# Patient Record
Sex: Male | Born: 1965
Health system: Southern US, Community
[De-identification: ages and names within clinical notes are randomized; demographics above are authoritative.]

## PROBLEM LIST (undated history)

## (undated) DIAGNOSIS — G4733 Obstructive sleep apnea (adult) (pediatric): Secondary | ICD-10-CM

## (undated) DIAGNOSIS — I4891 Unspecified atrial fibrillation: Secondary | ICD-10-CM

## (undated) DIAGNOSIS — E119 Type 2 diabetes mellitus without complications: Secondary | ICD-10-CM

## (undated) DIAGNOSIS — K219 Gastro-esophageal reflux disease without esophagitis: Secondary | ICD-10-CM

## (undated) DIAGNOSIS — E785 Hyperlipidemia, unspecified: Secondary | ICD-10-CM

## (undated) DIAGNOSIS — R945 Abnormal results of liver function studies: Secondary | ICD-10-CM

## (undated) DIAGNOSIS — R7302 Impaired glucose tolerance (oral): Secondary | ICD-10-CM

## (undated) DIAGNOSIS — M1990 Unspecified osteoarthritis, unspecified site: Secondary | ICD-10-CM

## (undated) DIAGNOSIS — I10 Essential (primary) hypertension: Secondary | ICD-10-CM

## (undated) DIAGNOSIS — K589 Irritable bowel syndrome without diarrhea: Secondary | ICD-10-CM

## (undated) DIAGNOSIS — M109 Gout, unspecified: Secondary | ICD-10-CM

## (undated) DIAGNOSIS — F419 Anxiety disorder, unspecified: Secondary | ICD-10-CM

## (undated) HISTORY — DX: Obstructive sleep apnea (adult) (pediatric): G47.33

## (undated) HISTORY — DX: Unspecified osteoarthritis, unspecified site: M19.90

## (undated) HISTORY — DX: Unspecified atrial fibrillation: I48.91

## (undated) HISTORY — DX: Hyperlipidemia, unspecified: E78.5

## (undated) HISTORY — DX: Gastro-esophageal reflux disease without esophagitis: K21.9

## (undated) HISTORY — DX: Type 2 diabetes mellitus without complications: E11.9

## (undated) HISTORY — PX: NO PAST SURGERIES: SHX2092

## (undated) HISTORY — DX: Irritable bowel syndrome, unspecified: K58.9

## (undated) HISTORY — DX: Impaired glucose tolerance (oral): R73.02

## (undated) HISTORY — DX: Abnormal results of liver function studies: R94.5

## (undated) HISTORY — DX: Essential (primary) hypertension: I10

## (undated) HISTORY — DX: Gout, unspecified: M10.9

## (undated) HISTORY — DX: Anxiety disorder, unspecified: F41.9

---

## 1998-07-29 DIAGNOSIS — R7989 Other specified abnormal findings of blood chemistry: Secondary | ICD-10-CM

## 1998-07-29 HISTORY — DX: Other specified abnormal findings of blood chemistry: R79.89

## 1999-08-23 ENCOUNTER — Emergency Department (HOSPITAL_COMMUNITY): Admission: EM | Admit: 1999-08-23 | Discharge: 1999-08-23 | Payer: Self-pay | Admitting: Emergency Medicine

## 1999-08-23 ENCOUNTER — Encounter: Payer: Self-pay | Admitting: Emergency Medicine

## 2002-06-21 ENCOUNTER — Encounter (INDEPENDENT_AMBULATORY_CARE_PROVIDER_SITE_OTHER): Payer: Self-pay | Admitting: Specialist

## 2002-06-21 ENCOUNTER — Ambulatory Visit (HOSPITAL_BASED_OUTPATIENT_CLINIC_OR_DEPARTMENT_OTHER): Admission: RE | Admit: 2002-06-21 | Discharge: 2002-06-21 | Payer: Self-pay | Admitting: Plastic Surgery

## 2004-06-07 ENCOUNTER — Ambulatory Visit: Payer: Self-pay | Admitting: Pulmonary Disease

## 2004-06-07 ENCOUNTER — Ambulatory Visit: Payer: Self-pay | Admitting: Internal Medicine

## 2004-06-19 ENCOUNTER — Ambulatory Visit (HOSPITAL_BASED_OUTPATIENT_CLINIC_OR_DEPARTMENT_OTHER): Admission: RE | Admit: 2004-06-19 | Discharge: 2004-06-19 | Payer: Self-pay | Admitting: Pulmonary Disease

## 2004-06-19 ENCOUNTER — Ambulatory Visit: Payer: Self-pay | Admitting: Pulmonary Disease

## 2004-07-09 ENCOUNTER — Ambulatory Visit: Payer: Self-pay | Admitting: Internal Medicine

## 2004-08-06 ENCOUNTER — Ambulatory Visit: Payer: Self-pay | Admitting: Internal Medicine

## 2004-08-07 ENCOUNTER — Ambulatory Visit: Payer: Self-pay | Admitting: Pulmonary Disease

## 2004-09-04 ENCOUNTER — Ambulatory Visit: Payer: Self-pay | Admitting: Pulmonary Disease

## 2004-09-17 ENCOUNTER — Ambulatory Visit: Payer: Self-pay | Admitting: Internal Medicine

## 2005-01-17 ENCOUNTER — Ambulatory Visit: Payer: Self-pay | Admitting: Internal Medicine

## 2005-07-01 ENCOUNTER — Ambulatory Visit: Payer: Self-pay | Admitting: Internal Medicine

## 2006-12-30 ENCOUNTER — Ambulatory Visit: Payer: Self-pay | Admitting: Internal Medicine

## 2007-01-29 ENCOUNTER — Emergency Department (HOSPITAL_COMMUNITY): Admission: EM | Admit: 2007-01-29 | Discharge: 2007-01-29 | Payer: Self-pay | Admitting: Emergency Medicine

## 2007-02-17 DIAGNOSIS — K219 Gastro-esophageal reflux disease without esophagitis: Secondary | ICD-10-CM | POA: Insufficient documentation

## 2007-02-17 DIAGNOSIS — E785 Hyperlipidemia, unspecified: Secondary | ICD-10-CM | POA: Insufficient documentation

## 2007-02-17 DIAGNOSIS — F39 Unspecified mood [affective] disorder: Secondary | ICD-10-CM | POA: Insufficient documentation

## 2007-02-17 DIAGNOSIS — M1610 Unilateral primary osteoarthritis, unspecified hip: Secondary | ICD-10-CM | POA: Insufficient documentation

## 2007-02-17 DIAGNOSIS — J309 Allergic rhinitis, unspecified: Secondary | ICD-10-CM | POA: Insufficient documentation

## 2007-02-21 DIAGNOSIS — M1990 Unspecified osteoarthritis, unspecified site: Secondary | ICD-10-CM | POA: Insufficient documentation

## 2007-02-23 ENCOUNTER — Ambulatory Visit: Payer: Self-pay | Admitting: Internal Medicine

## 2007-02-23 LAB — CONVERTED CEMR LAB
Albumin: 4.1 g/dL (ref 3.5–5.2)
BUN: 13 mg/dL (ref 6–23)
CO2: 31 meq/L (ref 19–32)
Calcium: 9.1 mg/dL (ref 8.4–10.5)
Chloride: 105 meq/L (ref 96–112)
Creatinine, Ser: 1.2 mg/dL (ref 0.4–1.5)
GFR calc Af Amer: 85 mL/min
GFR calc non Af Amer: 71 mL/min
Glucose, Bld: 106 mg/dL — ABNORMAL HIGH (ref 70–99)
Hgb A1c MFr Bld: 5.8 % (ref 4.6–6.0)
Phosphorus: 3.8 mg/dL (ref 2.3–4.6)
Potassium: 4.4 meq/L (ref 3.5–5.1)
Sodium: 143 meq/L (ref 135–145)
TSH: 1.01 microintl units/mL (ref 0.35–5.50)
Uric Acid, Serum: 8.5 mg/dL — ABNORMAL HIGH (ref 2.4–7.0)

## 2008-04-28 ENCOUNTER — Ambulatory Visit: Payer: Self-pay | Admitting: Family Medicine

## 2008-07-20 ENCOUNTER — Ambulatory Visit: Payer: Self-pay | Admitting: Family Medicine

## 2009-01-27 ENCOUNTER — Ambulatory Visit: Payer: Self-pay | Admitting: Family Medicine

## 2009-01-27 DIAGNOSIS — I10 Essential (primary) hypertension: Secondary | ICD-10-CM | POA: Insufficient documentation

## 2009-02-27 ENCOUNTER — Ambulatory Visit: Payer: Self-pay | Admitting: Internal Medicine

## 2009-02-27 DIAGNOSIS — K589 Irritable bowel syndrome without diarrhea: Secondary | ICD-10-CM | POA: Insufficient documentation

## 2009-02-28 LAB — CONVERTED CEMR LAB
Albumin: 4.1 g/dL (ref 3.5–5.2)
BUN: 15 mg/dL (ref 6–23)
CO2: 28 meq/L (ref 19–32)
Calcium: 8.8 mg/dL (ref 8.4–10.5)
Chloride: 105 meq/L (ref 96–112)
Creatinine, Ser: 1.1 mg/dL (ref 0.4–1.5)
Glucose, Bld: 134 mg/dL — ABNORMAL HIGH (ref 70–99)
Phosphorus: 3.2 mg/dL (ref 2.3–4.6)
Potassium: 3.8 meq/L (ref 3.5–5.1)
Sodium: 142 meq/L (ref 135–145)

## 2009-05-04 ENCOUNTER — Ambulatory Visit: Payer: Self-pay | Admitting: Internal Medicine

## 2009-06-14 ENCOUNTER — Ambulatory Visit: Payer: Self-pay | Admitting: Family Medicine

## 2009-10-27 ENCOUNTER — Ambulatory Visit: Payer: Self-pay | Admitting: Internal Medicine

## 2009-10-28 LAB — CONVERTED CEMR LAB: TSH: 1.25 microintl units/mL (ref 0.35–5.50)

## 2009-10-30 LAB — CONVERTED CEMR LAB
ALT: 89 units/L — ABNORMAL HIGH (ref 0–53)
AST: 57 units/L — ABNORMAL HIGH (ref 0–37)
Albumin: 4.3 g/dL (ref 3.5–5.2)
Alkaline Phosphatase: 72 units/L (ref 39–117)
BUN: 16 mg/dL (ref 6–23)
Basophils Absolute: 0 10*3/uL (ref 0.0–0.1)
Basophils Relative: 0.7 % (ref 0.0–3.0)
Bilirubin, Direct: 0.2 mg/dL (ref 0.0–0.3)
CO2: 30 meq/L (ref 19–32)
Calcium: 9.3 mg/dL (ref 8.4–10.5)
Chloride: 102 meq/L (ref 96–112)
Cholesterol: 203 mg/dL — ABNORMAL HIGH (ref 0–200)
Creatinine, Ser: 1.2 mg/dL (ref 0.4–1.5)
Direct LDL: 151.3 mg/dL
Eosinophils Absolute: 0.1 10*3/uL (ref 0.0–0.7)
Eosinophils Relative: 0.9 % (ref 0.0–5.0)
GFR calc non Af Amer: 69.44 mL/min (ref >60)
Glucose, Bld: 119 mg/dL — ABNORMAL HIGH (ref 70–99)
HCT: 43.2 % (ref 39.0–52.0)
HDL: 40.1 mg/dL (ref >39.00)
Hemoglobin: 14.8 g/dL (ref 13.0–17.0)
Hgb A1c MFr Bld: 6 % (ref 4.6–6.5)
Lymphocytes Relative: 25.3 % (ref 12.0–46.0)
Lymphs Abs: 1.8 10*3/uL (ref 0.7–4.0)
MCHC: 34.4 g/dL (ref 30.0–36.0)
MCV: 85.8 fL (ref 78.0–100.0)
Monocytes Absolute: 0.6 10*3/uL (ref 0.1–1.0)
Monocytes Relative: 7.8 % (ref 3.0–12.0)
Neutro Abs: 4.6 10*3/uL (ref 1.4–7.7)
Neutrophils Relative %: 65.3 % (ref 43.0–77.0)
Phosphorus: 4.1 mg/dL (ref 2.3–4.6)
Platelets: 166 10*3/uL (ref 150.0–400.0)
Potassium: 4.2 meq/L (ref 3.5–5.1)
RBC: 5.03 M/uL (ref 4.22–5.81)
RDW: 13.9 % (ref 11.5–14.6)
Sodium: 144 meq/L (ref 135–145)
Total Bilirubin: 1 mg/dL (ref 0.3–1.2)
Total CHOL/HDL Ratio: 5
Total Protein: 6.8 g/dL (ref 6.0–8.3)
Triglycerides: 191 mg/dL — ABNORMAL HIGH (ref 0.0–149.0)
VLDL: 38.2 mg/dL (ref 0.0–40.0)
WBC: 7.1 10*3/uL (ref 4.5–10.5)

## 2010-04-06 ENCOUNTER — Ambulatory Visit: Payer: Self-pay | Admitting: Internal Medicine

## 2010-08-01 ENCOUNTER — Ambulatory Visit
Admission: RE | Admit: 2010-08-01 | Discharge: 2010-08-01 | Payer: Self-pay | Source: Home / Self Care | Attending: Family Medicine | Admitting: Family Medicine

## 2010-08-01 DIAGNOSIS — M109 Gout, unspecified: Secondary | ICD-10-CM | POA: Insufficient documentation

## 2010-08-30 NOTE — Assessment & Plan Note (Signed)
Summary: gout/alc   Vital Signs:  Patient profile:   45 year old male Weight:      306.50 pounds Temp:     98.3 degrees F oral Pulse rate:   84 / minute Pulse rhythm:   regular BP sitting:   140 / 88  (left arm) Cuff size:   large  Vitals Entered By: Sydell Axon LPN (August 01, 2010 3:57 PM) CC: Pain in left foot, h/o gout   History of Present Illness: Pain in left foot that hurts even with just the sheet on his foot in bed. He had an episode last week that slowly got better with conservative mgt with IBP and "vinegar wrap" on the foot that his grandmother taught him. Then yesterday he had to wear steel-toed shoes and he had excruciating pain again...site of discomfort is the left foot lateral metatarsal area. He has had suspected gout sxs in the past and he has a uric acid on the chart of 8.2 previously. He had eaten ham the day before the initial outbreak and had had a few beers a day for a few days over the Christmas holiday.  Problems Prior to Update: 1)  Preventive Health Care  (ICD-V70.0) 2)  Impaired Fasting Glucose  (ICD-790.21) 3)  Ibs  (ICD-564.1) 4)  Hypertension  (ICD-401.9) 5)  Osteoarthritis  (ICD-715.90) 6)  Sleep Apnea- Obstructive  (ICD-780.57) 7)  Liver Function Tests, Abnormal (HEP SERUM, U/S) Fatty Liver  (ICD-794.8) 8)  Arthritis, Left Hip  (ICD-716.95) 9)  Hyperlipidemia  (ICD-272.4) 10)  Gerd  (ICD-530.81) 11)  Anxiety  (ICD-300.00) 12)  Allergic Rhinitis  (ICD-477.9)  Medications Prior to Update: 1)  Prilosec 20 Mg Cpdr (Omeprazole) .Marland Kitchen.. 1 Daily 2)  Trazodone Hcl 100 Mg Tabs (Trazodone Hcl) .... 1/2 -1  By Mouth At Bedtime As Needed To Help Sleep 3)  Citalopram Hydrobromide 20 Mg Tabs (Citalopram Hydrobromide) .... Take One Tablet By Mouth Daily 4)  Hydrochlorothiazide 12.5 Mg  Tabs (Hydrochlorothiazide) .... Take 1 Tab  By Mouth Every Morning 5)  Zyrtec 10 Mg Tabs (Cetirizine Hcl) .Marland Kitchen.. 1 Daily 6)  One-A-Day Mens  Tabs (Multiple Vitamin) .Marland Kitchen.. 1  Daily 7)  Hyoscyamine Sulfate Cr 0.375 Mg Xr12h-Tab (Hyoscyamine Sulfate) .Marland Kitchen.. 1 Tab By Mouth Two Times A Day To Control Irritable Bowel Symptoms  Allergies: No Known Drug Allergies  Physical Exam  General:  alert and normal appearance.  No acute distress except with removing his shoe....sxs significantly improved after removing it until he had to put it back on to leave the office. Extremities:  L foot with exruciating pain to touch over the 4th and 5th metatarsals without significant swelling, erythema or warmth.   Impression & Recommendations:  Problem # 1:  ACUTE GOUTY ARTHROPATHY (ICD-274.01) Assessment New  Presumed. Try continuing to use the wrap, take cherry juice and use Aleve 2 tabs after brkfst and supper. If no impr by tomm, take Colcrys. Take only as neede and no longer than necess....warned about diarrhea. His updated medication list for this problem includes:    Colcrys 0.6 Mg Tabs (Colchicine) ..... One tab by mouth two times a day  Complete Medication List: 1)  Prilosec 20 Mg Cpdr (Omeprazole) .Marland Kitchen.. 1 daily 2)  Trazodone Hcl 100 Mg Tabs (Trazodone hcl) .... 1/2 -1  by mouth at bedtime as needed to help sleep 3)  Citalopram Hydrobromide 20 Mg Tabs (Citalopram hydrobromide) .... Take one tablet by mouth daily 4)  Hydrochlorothiazide 12.5 Mg Tabs (Hydrochlorothiazide) .... Take 1  tab  by mouth every morning 5)  Zyrtec 10 Mg Tabs (Cetirizine hcl) .Marland Kitchen.. 1 daily 6)  One-a-day Mens Tabs (Multiple vitamin) .Marland Kitchen.. 1 daily 7)  Hyoscyamine Sulfate Cr 0.375 Mg Xr12h-tab (Hyoscyamine sulfate) .Marland Kitchen.. 1 tab by mouth two times a day to control irritable bowel symptoms 8)  Colcrys 0.6 Mg Tabs (Colchicine) .... One tab by mouth two times a day Prescriptions: COLCRYS 0.6 MG TABS (COLCHICINE) one tab by mouth two times a day  #20 x 0   Entered and Authorized by:   Shaune Leeks MD   Signed by:   Shaune Leeks MD on 08/01/2010   Method used:   Print then Give to Patient    RxID:   [id number]    Orders Added: 1)  Est. Patient Level III [44010]    Current Allergies (reviewed today): No known allergies

## 2010-08-30 NOTE — Assessment & Plan Note (Signed)
Summary: CPX  CYD   Vital Signs:  Patient profile:   45 year old male Weight:      296 pounds Temp:     98.7 degrees F oral Pulse rate:   88 / minute Pulse rhythm:   regular BP sitting:   120 / 80  (left arm) Cuff size:   large  Vitals Entered By: Mervin Hack CMA Duncan Dull) (October 27, 2009 8:21 AM) CC: adult physical   History of Present Illness: Doing well  continues to have stress at work and some at home Still feels the medicine helps him  Spoke with wife and she feels he is much better on the med  Had multiple illnesses this winter More sedentary so has gained some weight back No real change in eating habits  Knee is better now Occ hip pain  Allergies: No Known Drug Allergies  Past History:  Past medical, surgical, family and social histories (including risk factors) reviewed for relevance to current acute and chronic problems.  Past Medical History: Reviewed history from 02/27/2009 and no changes required. Allergic rhinitis Anxiety GERD Hyperlipidemia Elevated LFT's--fatty liver  2000 Osteoarthritis Glucose intolerance Obstructive sleep apnea Hypertension Irritable bowel syndrome  Past Surgical History: Reviewed history from 02/17/2007 and no changes required. Abd. pain- overnight  ~1990  Family History: Reviewed history from 02/17/2007 and no changes required. Father: Alive, CABG at 54 Mother: Alive, HTN, early DM Siblings: One brother;  one sister with DM CAD-- Dad HTN-- Mom DM- Mom, sister, ?? brother No prostate or colon cancer  Social History: Reviewed history from 02/27/2009 and no changes required. Marital Status: Married Children: 1 son Occupation: Manufacturing engineer Never Smoked Alcohol use-occ  Review of Systems General:  Denies sleep disorder; weight back up 12# sleeps well--only occ uses sleeping pill wears seat belt. Eyes:  Denies double vision and vision loss-1 eye. ENT:  Complains of decreased hearing; denies  ringing in ears; occ mild hearing problems Teeth okay--regular with dentist. CV:  Denies chest pain or discomfort, difficulty breathing at night, difficulty breathing while lying down, fainting, lightheadness, palpitations, and shortness of breath with exertion. Resp:  Denies cough and shortness of breath. GI:  Denies abdominal pain, bloody stools, change in bowel habits, dark tarry stools, indigestion, nausea, and vomiting. GU:  Denies erectile dysfunction, urinary frequency, and urinary hesitancy. MS:  See HPI; Complains of joint pain; denies joint swelling. Derm:  Denies lesion(s) and rash. Neuro:  Complains of headaches; denies numbness, tingling, and weakness; occ sinus headache. Psych:  Complains of anxiety; denies depression; anxiety controlled with meds. Heme:  Denies abnormal bruising and enlarge lymph nodes. Allergy:  Complains of seasonal allergies and sneezing; uses zyrtec occ sinus headache.  Physical Exam  General:  alert and normal appearance.   Eyes:  pupils equal, pupils round, pupils reactive to light, and no optic disk abnormalities.   Ears:  R ear normal and L ear normal.   Mouth:  no erythema and no lesions.   Neck:  supple and no masses.   Lungs:  normal respiratory effort and normal breath sounds.   Heart:  normal rate, regular rhythm, no murmur, and no gallop.   Abdomen:  soft and non-tender.   Msk:  no joint tenderness and no joint swelling.   Pulses:  normal in feet Extremities:  no edema Neurologic:  alert & oriented X3, strength normal in all extremities, and gait normal.   Skin:  no rashes and no suspicious lesions.   Multiple benign  nevi Axillary Nodes:  No palpable lymphadenopathy Psych:  normally interactive, good eye contact, not anxious appearing, and not depressed appearing.     Impression & Recommendations:  Problem # 1:  PREVENTIVE HEALTH CARE (ICD-V70.0) Assessment Comment Only fairly healthy but needs to attend to fitness again cancer  screening at age 73  Problem # 2:  HYPERTENSION (ICD-401.9) Assessment: Unchanged  good control no changes needed  His updated medication list for this problem includes:    Hydrochlorothiazide 12.5 Mg Tabs (Hydrochlorothiazide) .Marland Kitchen... Take 1 tab  by mouth every morning  BP today: 120/80 Prior BP: 118/86 (06/14/2009)  Labs Reviewed: K+: 3.8 (02/27/2009) Creat: : 1.1 (02/27/2009)     Orders: TLB-Renal Function Panel (80069-RENAL) TLB-CBC Platelet - w/Differential (85025-CBCD) TLB-TSH (Thyroid Stimulating Hormone) (84443-TSH) Venipuncture (09811)  Problem # 3:  HYPERLIPIDEMIA (ICD-272.4) Assessment: Comment Only  will check labs liver tests up so will not rush to meds  Orders: TLB-Lipid Panel (80061-LIPID) TLB-Hepatic/Liver Function Pnl (80076-HEPATIC)  Problem # 4:  ANXIETY (ICD-300.00) Assessment: Unchanged fine with meds will continue  His updated medication list for this problem includes:    Trazodone Hcl 100 Mg Tabs (Trazodone hcl) .Marland Kitchen... 1/2 -1  by mouth at bedtime as needed to help sleep    Citalopram Hydrobromide 20 Mg Tabs (Citalopram hydrobromide) .Marland Kitchen... Take one tablet by mouth daily  Problem # 5:  GERD (ICD-530.81) Assessment: Unchanged okay on meds  His updated medication list for this problem includes:    Prilosec 20 Mg Cpdr (Omeprazole) .Marland Kitchen... 1 daily    Hyoscyamine Sulfate 0.125 Mg Tabs (Hyoscyamine sulfate) .Marland Kitchen... 1 tab before meals as needed for irritable bowel  Complete Medication List: 1)  Zyrtec 10 Mg Tabs (Cetirizine hcl) .Marland Kitchen.. 1 daily 2)  Prilosec 20 Mg Cpdr (Omeprazole) .Marland Kitchen.. 1 daily 3)  One-a-day Mens Tabs (Multiple vitamin) .Marland Kitchen.. 1 daily 4)  Trazodone Hcl 100 Mg Tabs (Trazodone hcl) .... 1/2 -1  by mouth at bedtime as needed to help sleep 5)  Citalopram Hydrobromide 20 Mg Tabs (Citalopram hydrobromide) .... Take one tablet by mouth daily 6)  Hydrochlorothiazide 12.5 Mg Tabs (Hydrochlorothiazide) .... Take 1 tab  by mouth every morning 7)   Hyoscyamine Sulfate 0.125 Mg Tabs (Hyoscyamine sulfate) .Marland Kitchen.. 1 tab before meals as needed for irritable bowel  Other Orders: TLB-A1C / Hgb A1C (Glycohemoglobin) (83036-A1C)  Patient Instructions: 1)  Please schedule a follow-up appointment in 6 months .  Prescriptions: PRILOSEC 20 MG CPDR (OMEPRAZOLE) 1 daily Brand medically necessary #90 x 3   Entered by:   Mervin Hack CMA (AAMA)   Authorized by:   Cindee Salt MD   Signed by:   Mervin Hack CMA (AAMA) on 10/27/2009   Method used:   [facility] to        [facility]  [address] (retail)       [address]       [place], [place]  [address]       Ph: [phone] or [phone]       Fax: [phone]   RxID:   [id number]   Current Allergies (reviewed today): No known allergies   Appended Document: CPX  CYD     Clinical Lists Changes  Orders: Added new Service order of Tdap => 76yrs IM (59563) - Signed Added new Service order of Admin 1st Vaccine (87564) - Signed Added new Service order of Admin 1st Vaccine [facility]) [phone]) - Signed Observations: Added new observation of TD BOOST VIS: 06/16/07 version given October 27, 2009. (  10/27/2009 9:27) Added new observation of TD BOOSTERLO: [id number] (10/27/2009 9:27) Added new observation of TD BOOST EXP: 09/23/2011 (10/27/2009 9:27) Added new observation of TD BOOSTERBY: DeShannon Smith CMA (AAMA) (10/27/2009 9:27) Added new observation of TD BOOSTERRT: IM (10/27/2009 9:27) Added new observation of TDBOOSTERDSE: 0.5 ml (10/27/2009 9:27) Added new observation of TD BOOSTERMF: [employer] (10/27/2009 9:27) Added new observation of TD BOOST SIT: right deltoid (10/27/2009 9:27) Added new observation of TD BOOSTER: Tdap (10/27/2009 9:27)       Tetanus/Td Vaccine    Vaccine Type: Tdap    Site: right deltoid    Mfr: GlaxoSmithKline    Dose: 0.5 ml    Route: IM    Given by: Mervin Hack CMA (AAMA)    Exp. Date: 09/23/2011    Lot #:  [id number]    VIS given: 06/16/07 version given October 27, 2009.

## 2010-08-30 NOTE — Assessment & Plan Note (Signed)
Summary: 6 M F/UDLO   Vital Signs:  Patient profile:   45 year old male Weight:      300 pounds BMI:     39.72 Temp:     98.3 degrees F oral Pulse rate:   68 / minute Pulse rhythm:   regular BP sitting:   132 / 100  (left arm) Cuff size:   large  Vitals Entered By: Mervin Hack CMA Duncan Dull) (April 06, 2010 9:06 AM) CC: 6 month follow-up   History of Present Illness: Doing okay in general No new concerns  Doesn't check BP no headaches No exercise No chest pain No SOB  having trouble with his stomach Gets "tore up" after eating at times Knows he is lactose sensitive Occ can have problems even eating salad----will get diarrhea Not sure if the hyoscamine has helped in the past No blood in stools No pain in stomach but gets cramping  No anxiety problems Happy with the med  Allergies: No Known Drug Allergies  Past History:  Past medical, surgical, family and social histories (including risk factors) reviewed for relevance to current acute and chronic problems.  Past Medical History: Reviewed history from 02/27/2009 and no changes required. Allergic rhinitis Anxiety GERD Hyperlipidemia Elevated LFT's--fatty liver  2000 Osteoarthritis Glucose intolerance Obstructive sleep apnea Hypertension Irritable bowel syndrome  Past Surgical History: Reviewed history from 02/17/2007 and no changes required. Abd. pain- overnight  ~1990  Family History: Reviewed history from 02/17/2007 and no changes required. Father: Alive, CABG at 41 Mother: Alive, HTN, early DM Siblings: One brother;  one sister with DM CAD-- Dad HTN-- Mom DM- Mom, sister, ?? brother No prostate or colon cancer  Social History: Reviewed history from 02/27/2009 and no changes required. Marital Status: Married Children: 1 son Occupation: Manufacturing engineer Never Smoked Alcohol use-occ  Review of Systems       weight up 4# Had injured his knee again--only just now feeling a  little better No sig arthritis problems in the warm weather  Physical Exam  General:  alert and normal appearance.   Neck:  supple, no masses, no thyromegaly, no carotid bruits, and no cervical lymphadenopathy.   Lungs:  normal respiratory effort, no intercostal retractions, no accessory muscle use, and normal breath sounds.   Heart:  normal rate, regular rhythm, no murmur, and no gallop.   Abdomen:  soft, non-tender, and no masses.   Extremities:  no edema Psych:  normally interactive, good eye contact, not anxious appearing, and not depressed appearing.     Impression & Recommendations:  Problem # 1:  IBS (ICD-564.1) Assessment Deteriorated more symptoms but still fairly classic history will try two times a day levbid GI referral if no better  Problem # 2:  HYPERTENSION (ICD-401.9) Assessment: Deteriorated Up today not consistently up counselled on lifestyle---will increase meds next time if still up  His updated medication list for this problem includes:    Hydrochlorothiazide 12.5 Mg Tabs (Hydrochlorothiazide) .Marland Kitchen... Take 1 tab  by mouth every morning  BP today: 132/100 Prior BP: 120/80 (10/27/2009)  Labs Reviewed: K+: 4.2 (10/27/2009) Creat: : 1.2 (10/27/2009)   Chol: 203 (10/27/2009)   HDL: 40.10 (10/27/2009)   TG: 191.0 (10/27/2009)  Problem # 3:  ANXIETY (ICD-300.00) Assessment: Improved satisfied with the med  His updated medication list for this problem includes:    Trazodone Hcl 100 Mg Tabs (Trazodone hcl) .Marland Kitchen... 1/2 -1  by mouth at bedtime as needed to help sleep    Citalopram Hydrobromide 20  Mg Tabs (Citalopram hydrobromide) .Marland Kitchen... Take one tablet by mouth daily  Complete Medication List: 1)  Prilosec 20 Mg Cpdr (Omeprazole) .Marland Kitchen.. 1 daily 2)  Trazodone Hcl 100 Mg Tabs (Trazodone hcl) .... 1/2 -1  by mouth at bedtime as needed to help sleep 3)  Citalopram Hydrobromide 20 Mg Tabs (Citalopram hydrobromide) .... Take one tablet by mouth daily 4)   Hydrochlorothiazide 12.5 Mg Tabs (Hydrochlorothiazide) .... Take 1 tab  by mouth every morning 5)  Zyrtec 10 Mg Tabs (Cetirizine hcl) .Marland Kitchen.. 1 daily 6)  One-a-day Mens Tabs (Multiple vitamin) .Marland Kitchen.. 1 daily 7)  Hyoscyamine Sulfate Cr 0.375 Mg Xr12h-tab (Hyoscyamine sulfate) .Marland Kitchen.. 1 tab by mouth two times a day to control irritable bowel symptoms  Patient Instructions: 1)  Please try the step down (stage 2) on the [employer] 2)  Call if intestinal symptoms persist to get referral 3)  Please schedule a follow-up appointment in 6 months for physical Prescriptions: HYOSCYAMINE SULFATE CR 0.375 MG XR12H-TAB (HYOSCYAMINE SULFATE) 1 tab by mouth two times a day to control irritable bowel symptoms  #60 x 12   Entered and Authorized by:   Cindee Salt MD   Signed by:   Cindee Salt MD on 04/06/2010   Method used:   [facility]        [facility]  [address] [facility]       [address]       [place], [place]  [address]       Ph: [phone] or [phone]       Fax: [phone]   RxID:   [id number]   Current Allergies (reviewed today): No known allergies

## 2010-08-31 ENCOUNTER — Telehealth: Payer: Self-pay | Admitting: Internal Medicine

## 2010-09-08 ENCOUNTER — Encounter: Payer: Self-pay | Admitting: Internal Medicine

## 2010-09-13 NOTE — Progress Notes (Signed)
Summary: hyoscyamine  Phone Note Refill Request Message from:  Patient on August 31, 2010 3:36 PM  Refills Requested: Medication #1:  HYOSCYAMINE SULFATE CR 0.375 MG XR12H-TAB 1 tab by mouth two times a day to control irritable bowel symptoms Patient is asking for a written script for mail order. Please call when ready. He would like to pick it up on Monday if possible.   Initial call taken by: Melody Comas,  August 31, 2010 3:37 PM  Follow-up for Phone Call        Rx written Follow-up by: Cindee Salt MD,  September 03, 2010 1:56 PM  Additional Follow-up for Phone Call Additional follow up Details #1::        left message on machine that rx ready for pick-up  Additional Follow-up by: DeShannon Smith CMA Duncan Dull),  September 03, 2010 3:46 PM    Prescriptions: HYOSCYAMINE SULFATE CR 0.375 MG XR12H-TAB (HYOSCYAMINE SULFATE) 1 tab by mouth two times a day to control irritable bowel symptoms  #180 x 3   Entered and Authorized by:   Cindee Salt MD   Signed by:   Cindee Salt MD on 09/03/2010   Method used:   Print then Give to Patient   RxID:   [id number]

## 2010-10-30 ENCOUNTER — Encounter: Payer: Self-pay | Admitting: Internal Medicine

## 2010-12-14 NOTE — Op Note (Signed)
   NAME:  Charles Phelps, Charles Phelps                   ACCOUNT NO.:  [id number]   MEDICAL RECORD NO.:  [id number]                   PATIENT TYPE:  AMB   LOCATION:  DSC                                  FACILITY:  [facility]   PHYSICIAN:  Etter Sjogren, M.D.                  DATE OF BIRTH:  Apr 27, 1966   DATE OF PROCEDURE:  06/21/2002  DATE OF DISCHARGE:                                 OPERATIVE REPORT   PREOPERATIVE DIAGNOSES:  1. Lesion of undetermined behavior, left lower eyelid greater than 0.5 cm.  2. Separate lesion, left lower eyelid of undetermined behavior, less than     0.5 cm.   POSTOPERATIVE DIAGNOSES:  1. Lesion of undetermined behavior, left lower eyelid greater than 0.5 cm.  2. Separate lesion, left lower eyelid of undetermined behavior, less than     0.5 cm.   OPERATION/PROCEDURE:  1. Excision of lesion of undetermined behavior, left lower eyelid, greater     than 0.5 cm.  2. Excision of lesion, left lower eyelid, less than 0.5 cm.   ANESTHESIA:  1% Xylocaine with epinephrine plus bicarbonate.   CLINICAL NOTE:  A 45 year old man with a few pigmented lesions raised,  enlarging, changing and excision is warranted.  The nature of the procedure  and the risks were discussed with him and he understood and wished to  proceed.  He understood the risks of scarring and further surgery pending  the final pathology report.  He wished to proceed.   DESCRIPTION OF PROCEDURE:  The patient was taken to the operating room and  placed supine.  The excisions were marked.  He was prepped with Betadine and  draped with sterile drapes.  After satisfactory local anesthesia was  achieved, an excision was performed.  The wounds were irrigated thoroughly.  Good hemostasis was noted and the closure was with 6-0 Prolene simple  interrupted sutures.  Antibiotic ointment was applied and he tolerated the  procedure well.   DISPOSITION:  No vigorous activities today.  Activities as tolerated  tomorrow.   See him back in a week for a recheck.                                                 Etter Sjogren, M.D.    DB/MEDQ  D:  06/21/2002  T:  06/21/2002  Job:  [id number]

## 2010-12-14 NOTE — Procedures (Signed)
NAMEORY, ELTING NO.:  [id number]   MEDICAL RECORD NO.:  [id number]          PATIENT TYPE:  OUT   LOCATION:  SLEEP CENTER                 FACILITY:  [facility]   PHYSICIAN:  Marcelyn Bruins, M.D. [facility] DATE OF BIRTH:  04/05/1966   DATE OF STUDY:  06/19/2004                              NOCTURNAL POLYSOMNOGRAM   REFERRING PHYSICIAN:  Dr. Marcelyn Bruins.   INDICATION FOR SURGERY:  Hypersomnia with sleep apnea.   SLEEP ARCHITECTURE:  The patient had total sleep time of 368 minutes, but  never achieved REM or slow-wave sleep.  Sleep onset was normal.  Sleep  efficiency was 71%.   IMPRESSION:  1.  Severe obstructive sleep apnea/hypopnea syndrome with a respiratory      disturbance index of 86 events per hour and O2 desaturation as low at      76%.  The events did not appear to be positional.  The patient never      achieved REM or slow-wave sleep.  2.  Loud snoring noted throughout the study.  3.  No clinically significant cardiac arrhythmias.      KC/MEDQ  D:  07/09/2004 12:13:57  T:  07/09/2004 13:40:13  Job:  [id number]

## 2011-02-01 ENCOUNTER — Ambulatory Visit (INDEPENDENT_AMBULATORY_CARE_PROVIDER_SITE_OTHER): Payer: BC Managed Care – PPO | Admitting: Internal Medicine

## 2011-02-01 ENCOUNTER — Encounter: Payer: Self-pay | Admitting: Internal Medicine

## 2011-02-01 VITALS — BP 148/91 | HR 68 | Temp 98.6°F | Ht 73.0 in | Wt 305.0 lb

## 2011-02-01 DIAGNOSIS — E785 Hyperlipidemia, unspecified: Secondary | ICD-10-CM

## 2011-02-01 DIAGNOSIS — F411 Generalized anxiety disorder: Secondary | ICD-10-CM

## 2011-02-01 DIAGNOSIS — Z0000 Encounter for general adult medical examination without abnormal findings: Secondary | ICD-10-CM | POA: Insufficient documentation

## 2011-02-01 DIAGNOSIS — I10 Essential (primary) hypertension: Secondary | ICD-10-CM

## 2011-02-01 DIAGNOSIS — R7301 Impaired fasting glucose: Secondary | ICD-10-CM

## 2011-02-01 DIAGNOSIS — K589 Irritable bowel syndrome without diarrhea: Secondary | ICD-10-CM

## 2011-02-01 LAB — BASIC METABOLIC PANEL
Calcium: 9 mg/dL (ref 8.4–10.5)
Creatinine, Ser: 1.2 mg/dL (ref 0.4–1.5)
GFR: 69.73 mL/min (ref >60.00)
Glucose, Bld: 141 mg/dL — ABNORMAL HIGH (ref 70–99)
Sodium: 141 mEq/L (ref 135–145)

## 2011-02-01 LAB — CBC WITH DIFFERENTIAL/PLATELET
Basophils Absolute: 0.1 10*3/uL (ref 0.0–0.1)
Eosinophils Absolute: 0.2 10*3/uL (ref 0.0–0.7)
Hemoglobin: 14.8 g/dL (ref 13.0–17.0)
Lymphocytes Relative: 23.1 % (ref 12.0–46.0)
MCHC: 34.4 g/dL (ref 30.0–36.0)
Monocytes Relative: 7.1 % (ref 3.0–12.0)
Neutro Abs: 4.5 10*3/uL (ref 1.4–7.7)
Neutrophils Relative %: 65.4 % (ref 43.0–77.0)
RDW: 14.2 % (ref 11.5–14.6)

## 2011-02-01 LAB — HEMOGLOBIN A1C: Hgb A1c MFr Bld: 7.5 % — ABNORMAL HIGH (ref 4.6–6.5)

## 2011-02-01 LAB — HEPATIC FUNCTION PANEL
Albumin: 4.7 g/dL (ref 3.5–5.2)
Total Protein: 7.5 g/dL (ref 6.0–8.3)

## 2011-02-01 LAB — TSH: TSH: 1.2 u[IU]/mL (ref 0.35–5.50)

## 2011-02-01 MED ORDER — HYOSCYAMINE SULFATE ER 0.375 MG PO TB12: 0.3750 mg | ORAL_TABLET | Freq: Two times a day (BID) | ORAL | Status: DC | PRN

## 2011-02-01 MED ORDER — OMEPRAZOLE 20 MG PO CPDR: 20.0000 mg | DELAYED_RELEASE_CAPSULE | Freq: Every day | ORAL | Status: DC

## 2011-02-01 MED ORDER — LISINOPRIL 20 MG PO TABS: 20.0000 mg | ORAL_TABLET | Freq: Every day | ORAL | Status: DC

## 2011-02-01 NOTE — Assessment & Plan Note (Signed)
BP Readings from Last 3 Encounters:  02/01/11 148/91  08/01/10 140/88  04/06/10 132/100   Inadequate control and need to change thiazide due to gout Will start lisinopril 20

## 2011-02-01 NOTE — Assessment & Plan Note (Signed)
Will set up counselling at [facility] if diabetic

## 2011-02-01 NOTE — Assessment & Plan Note (Signed)
Does okay with the levsin

## 2011-02-01 NOTE — Progress Notes (Signed)
Subjective:    Patient ID: Charles Phelps, male    DOB: March 12, 1966, 45 y.o.   MRN: [id number]  HPI Doing fairly well No recurrence of joint pain--or maybe one other time Better with cherry juice Feels Charles Phelps brought it on by member/guest tournament at [employer] and had some beers  No other concerns Same job No changes in family history  Current Outpatient Prescriptions on File Prior to Visit  Medication Sig Dispense Refill  . cetirizine (ZYRTEC) 10 MG tablet Take 10 mg by mouth daily.        . citalopram (CELEXA) 20 MG tablet Take 20 mg by mouth daily.        . hydrochlorothiazide 12.5 MG capsule Take 12.5 mg by mouth every morning.       . Multiple Vitamin (ONE-A-DAY MENS) TABS Take by mouth.        . traZODone (DESYREL) 100 MG tablet 1/2 - 1 by mouth at bedtime as needed to help sleep      . DISCONTD: hyoscyamine (LEVBID) 0.375 MG 12 hr tablet Take 0.375 mg by mouth 2 (two) times daily as needed.       Marland Kitchen DISCONTD: omeprazole (PRILOSEC) 20 MG capsule Take 20 mg by mouth daily.        Marland Kitchen DISCONTD: colchicine (COLCRYS) 0.6 MG tablet Take 0.6 mg by mouth. Take 1 tab by mouth every two times a day         No Known Allergies  Past Medical History  Diagnosis Date  . Allergic rhinitis   . Anxiety   . GERD (gastroesophageal reflux disease)   . Hyperlipidemia   . Elevated LFTs 2000    fatty liver  . Osteoarthritis   . Glucose intolerance (impaired glucose tolerance)   . Obstructive sleep apnea   . Hypertension   . IBS (irritable bowel syndrome)     No past surgical history on file.  Family History  Problem Relation Age of Onset  . Hypertension Mother   . Diabetes Mother   . Heart disease Father     CABG at 2  . Diabetes Sister   . Diabetes Brother     History   Social History  . Marital Status: Married    Spouse Name: N/A    Number of Children: 1  . Years of Education: N/A   Occupational History  . Industrial / Proofreader     Social History Main  Topics  . Smoking status: Never Smoker   . Smokeless tobacco: Not on file  . Alcohol Use: Yes     Occasionally  . Drug Use: Not on file  . Sexually Active: Not on file   Other Topics Concern  . Not on file   Social History Narrative  . No narrative on file   Review of Systems  Constitutional: Negative for fatigue and unexpected weight change.       Weight stable Wears seat belt No sig exercise Discussed his obesity! Always has been hyperactive--can't focus that well  HENT: Positive for congestion and rhinorrhea. Negative for hearing loss, dental problem and tinnitus.        OTC meds work fine  Eyes: Negative for visual disturbance.       No diplopia or focal vision loss  Respiratory: Negative for cough, chest tightness and shortness of breath.   Cardiovascular: Negative for chest pain, palpitations and leg swelling.  Gastrointestinal: Negative for nausea, vomiting, abdominal pain, constipation and blood in stool.  Still has some trouble with irritable bowel--hyoscamine does help Prilosec seems to help more than the omeprazole  Genitourinary: Negative for urgency, frequency, decreased urine volume and difficulty urinating.       No sexual problems  Musculoskeletal: Positive for arthralgias. Negative for back pain and joint swelling.       No problems other than apparent gout  Skin: Negative for rash.       No suspicious lesions  Neurological: Negative for dizziness, syncope, weakness, light-headedness, numbness and headaches.  Hematological: Negative for adenopathy. Does not bruise/bleed easily.  Psychiatric/Behavioral: Negative for sleep disturbance and dysphoric mood. The patient is not nervous/anxious.        Sleeps well with CPAP       Objective:   Physical Exam  Constitutional: Charles Phelps is oriented to person, place, and time. Charles Phelps appears well-developed and well-nourished. No distress.  HENT:  Head: Normocephalic and atraumatic.  Right Ear: External ear normal.  Left  Ear: External ear normal.  Mouth/Throat: Oropharynx is clear and moist. No oropharyngeal exudate.       TMs normal  Eyes: Conjunctivae and EOM are normal. Pupils are equal, round, and reactive to light.       Fundi benign  Neck: Normal range of motion. Neck supple. No thyromegaly present.  Cardiovascular: Normal rate, regular rhythm, normal heart sounds and intact distal pulses.  Exam reveals no gallop.   No murmur heard. Pulmonary/Chest: Effort normal and breath sounds normal. No respiratory distress. Charles Phelps has no wheezes. Charles Phelps has no rales.  Abdominal: Soft. Charles Phelps exhibits no mass. There is no tenderness.  Musculoskeletal: Normal range of motion. Charles Phelps exhibits no edema and no tenderness.  Lymphadenopathy:    Charles Phelps has no cervical adenopathy.  Neurological: Charles Phelps is alert and oriented to person, place, and time. Charles Phelps exhibits normal muscle tone.       Normal gait and strength  Skin: Skin is warm. No rash noted.  Psychiatric: Charles Phelps has a normal mood and affect. His behavior is normal. Judgment and thought content normal.          Assessment & Plan:

## 2011-02-01 NOTE — Assessment & Plan Note (Signed)
No specific worrisome features but overall fitness is poor counselled on need for weight loss or he will need diabetes and chol meds

## 2011-02-01 NOTE — Assessment & Plan Note (Signed)
Doing well on the meds.

## 2011-02-01 NOTE — Patient Instructions (Signed)
Please stop the HCTZ Start the lisinopril with 1/2 tab daily for the first 2 days, then increase to full tab if no problems. Stop if you have any hand, face or feet swelling. Call if you develop a persistent dry cough. Please set up blood work in about 1 month (met B--401.9)

## 2011-02-01 NOTE — Assessment & Plan Note (Signed)
No results found for this basename: LDLCALC   Will need meds if diabetic Work on lifestyle for now

## 2011-02-07 ENCOUNTER — Telehealth: Payer: Self-pay | Admitting: *Deleted

## 2011-02-07 DIAGNOSIS — IMO0001 Reserved for inherently not codable concepts without codable children: Secondary | ICD-10-CM

## 2011-02-07 DIAGNOSIS — E119 Type 2 diabetes mellitus without complications: Secondary | ICD-10-CM | POA: Insufficient documentation

## 2011-02-07 DIAGNOSIS — E1159 Type 2 diabetes mellitus with other circulatory complications: Secondary | ICD-10-CM | POA: Insufficient documentation

## 2011-02-07 NOTE — Telephone Encounter (Signed)
I will call the patient to give the details about going to [facility] for diabetes teaching. Dr Alphonsus Sias filling out the paperwork for me to fax over to them.

## 2011-02-07 NOTE — Telephone Encounter (Signed)
.  left message to have patient return my call.  

## 2011-02-07 NOTE — Telephone Encounter (Signed)
Patient called back and I advised lab results, patient would like to get the referral for diabetes counseling at [facility]. I will forward the message on to [facility].

## 2011-02-07 NOTE — Telephone Encounter (Signed)
Message copied by Sueanne Margarita on Thu Feb 07, 2011  9:29 AM ------      Message from: Tillman Abide I      Created: Sat Feb 02, 2011 11:14 AM       Please call      The blood sugar has gone up and he is now in the diabetic range      Please set up counselling for diabetes at [facility] diabetes counselling. Fortunately no meds needed yet      Liver tests are up quite a bit----this is worrisome that the fatty infiltration is worsening and could be a problem. He really needs to follow the diabetes recommendations and lose some of that body fat and weight!!      Blood count, kidney and thyroid tests are normal      Set up appt to review his diabetes progress in about 2 months

## 2011-05-03 ENCOUNTER — Other Ambulatory Visit: Payer: Self-pay | Admitting: Internal Medicine

## 2011-06-25 ENCOUNTER — Other Ambulatory Visit: Payer: Self-pay | Admitting: Internal Medicine

## 2011-07-12 ENCOUNTER — Emergency Department (HOSPITAL_COMMUNITY)
Admission: EM | Admit: 2011-07-12 | Discharge: 2011-07-12 | Disposition: A | Discharge status: Home / Self Care | Payer: BC Managed Care – PPO | Attending: Emergency Medicine | Admitting: Emergency Medicine

## 2011-07-12 ENCOUNTER — Encounter (HOSPITAL_COMMUNITY): Payer: Self-pay | Admitting: Emergency Medicine

## 2011-07-12 DIAGNOSIS — K219 Gastro-esophageal reflux disease without esophagitis: Secondary | ICD-10-CM | POA: Insufficient documentation

## 2011-07-12 DIAGNOSIS — M25539 Pain in unspecified wrist: Secondary | ICD-10-CM | POA: Insufficient documentation

## 2011-07-12 DIAGNOSIS — E785 Hyperlipidemia, unspecified: Secondary | ICD-10-CM | POA: Insufficient documentation

## 2011-07-12 DIAGNOSIS — I10 Essential (primary) hypertension: Secondary | ICD-10-CM | POA: Insufficient documentation

## 2011-07-12 DIAGNOSIS — F411 Generalized anxiety disorder: Secondary | ICD-10-CM | POA: Insufficient documentation

## 2011-07-12 DIAGNOSIS — Z79899 Other long term (current) drug therapy: Secondary | ICD-10-CM | POA: Insufficient documentation

## 2011-07-12 DIAGNOSIS — M109 Gout, unspecified: Secondary | ICD-10-CM | POA: Insufficient documentation

## 2011-07-12 MED ORDER — INDOMETHACIN 25 MG PO CAPS: 25.0000 mg | ORAL_CAPSULE | Freq: Three times a day (TID) | ORAL | Status: DC | PRN

## 2011-07-12 MED ORDER — HYDROCODONE-ACETAMINOPHEN 5-325 MG PO TABS
2.0000 | ORAL_TABLET | ORAL | Status: AC | PRN
Start: 2011-07-12 — End: 2011-07-22

## 2011-07-12 NOTE — ED Notes (Signed)
PT. REPORTS RIGHT WRIST PAIN FOR SEVERAL DAYS WORSE THIS MORNING - WOKE HIM UP , DENIES INJURY , SLIGHT SWELLING.

## 2011-07-12 NOTE — ED Provider Notes (Signed)
History     CSN: [id number] Arrival date & time: 07/12/2011  4:04 AM   First MD Initiated Contact with Patient 07/12/11 [phone]      Chief Complaint  Patient presents with  . Wrist Pain    (Consider location/radiation/quality/duration/timing/severity/associated sxs/prior treatment) HPI Comments: Patient here with a history of gout with pain for the past several days in his right wrist - he denies injury to the wrist - states that he initially wrapped the wrist which made it feel somewhat better - he reports pain worse with movement and palpation - denies redness, rash, radiation of the pain.  Patient is a 45 y.o. male presenting with wrist pain. The history is provided by the patient. No language interpreter was used.  Wrist Pain This is a new problem. The current episode started yesterday. The problem occurs constantly. The problem has been unchanged. Associated symptoms include arthralgias. Pertinent negatives include no abdominal pain, chest pain, chills, coughing, fatigue, fever, joint swelling, nausea, numbness, rash, swollen glands, visual change, vomiting or weakness. The symptoms are aggravated by nothing. He has tried nothing for the symptoms. The treatment provided no relief.    Past Medical History  Diagnosis Date  . Allergic rhinitis   . Anxiety   . GERD (gastroesophageal reflux disease)   . Hyperlipidemia   . Elevated LFTs 2000    fatty liver  . Osteoarthritis   . Glucose intolerance (impaired glucose tolerance)   . Obstructive sleep apnea   . Hypertension   . IBS (irritable bowel syndrome)     History reviewed. No pertinent past surgical history.  Family History  Problem Relation Age of Onset  . Hypertension Mother   . Diabetes Mother   . Heart disease Father     CABG at 9  . Diabetes Sister   . Diabetes Brother     History  Substance Use Topics  . Smoking status: Never Smoker   . Smokeless tobacco: Not on file  . Alcohol Use: Yes     Occasionally       Review of Systems  Constitutional: Negative for fever, chills and fatigue.  Respiratory: Negative for cough.   Cardiovascular: Negative for chest pain.  Gastrointestinal: Negative for nausea, vomiting and abdominal pain.  Musculoskeletal: Positive for arthralgias. Negative for joint swelling.  Skin: Negative for rash.  Neurological: Negative for weakness and numbness.  All other systems reviewed and are negative.    Allergies  Review of patient's allergies indicates no known allergies.  Home Medications   Current Outpatient Rx  Name Route Sig Dispense Refill  . CETIRIZINE HCL 10 MG PO TABS Oral Take 10 mg by mouth daily.      Marland Kitchen CITALOPRAM HYDROBROMIDE 20 MG PO TABS  TAKE 1 TABLET DAILY 90 tablet 2  . HYDROCHLOROTHIAZIDE 12.5 MG PO TABS  TAKE 1 TABLET EVERY MORNING 90 tablet 2  . HYOSCYAMINE SULFATE ER 0.375 MG PO TB12 Oral Take 0.375 mg by mouth daily.      Marland Kitchen LISINOPRIL 20 MG PO TABS Oral Take 1 tablet (20 mg total) by mouth daily. 90 tablet 3  . ONE-A-DAY MENS PO TABS Oral Take by mouth.      . OMEPRAZOLE 20 MG PO CPDR Oral Take 1 capsule (20 mg total) by mouth daily. BRAND NAME ONLY 30 capsule 11    BRAND NAME ONLY  . TRAZODONE HCL 100 MG PO TABS  50 mg. 1/2 - 1 by mouth at bedtime as needed to help sleep  BP 158/94  Pulse 76  Temp(Src) 98 F (36.7 C) (Oral)  Resp 20  SpO2 98%  Physical Exam  Nursing note and vitals reviewed. Constitutional: He is oriented to person, place, and time. He appears well-developed and well-nourished. No distress.  HENT:  Head: Normocephalic and atraumatic.  Right Ear: External ear normal.  Left Ear: External ear normal.  Mouth/Throat: Oropharynx is clear and moist. No oropharyngeal exudate.  Eyes: Conjunctivae are normal. Pupils are equal, round, and reactive to light. No scleral icterus.  Neck: Normal range of motion. Neck supple.  Cardiovascular: Normal rate, regular rhythm and normal heart sounds.  Exam reveals no gallop  and no friction rub.   No murmur heard. Pulmonary/Chest: Effort normal and breath sounds normal. No respiratory distress. He exhibits no tenderness.  Abdominal: Soft. Bowel sounds are normal. He exhibits no distension. There is no tenderness.  Musculoskeletal:       Right wrist: He exhibits decreased range of motion and tenderness. He exhibits no swelling, no effusion and no deformity.  Neurological: He is alert and oriented to person, place, and time. No cranial nerve deficit.  Skin: Skin is warm and dry. No rash noted. No erythema.  Psychiatric: He has a normal mood and affect. His behavior is normal. Judgment and thought content normal.    ED Course  Procedures (including critical care time)  Labs Reviewed - No data to display No results found.   Gout    MDM  Patient with a history of gout presents with right wrist pain for the past 2 days - no injury, diffusely ttp, no erythema or fever so I doubt septic arthritis - will start on short course pain control and start on indomethicin.        Izola Price Hubbell, [place] 07/12/11 [phone]

## 2011-07-12 NOTE — ED Provider Notes (Signed)
Evaluation and management procedures were performed by the mid-level provider (PA/NP/CNM) under my supervision/collaboration. I was present and available during the ED course. Charles Dulski Y.   Gavin Pound. Oletta Lamas, MD 07/12/11 [phone]

## 2011-08-05 ENCOUNTER — Ambulatory Visit: Payer: BC Managed Care – PPO | Admitting: Internal Medicine

## 2011-09-03 ENCOUNTER — Ambulatory Visit (INDEPENDENT_AMBULATORY_CARE_PROVIDER_SITE_OTHER): Payer: BC Managed Care – PPO | Admitting: Internal Medicine

## 2011-09-03 ENCOUNTER — Encounter: Payer: Self-pay | Admitting: Internal Medicine

## 2011-09-03 VITALS — BP 120/80 | HR 74 | Temp 98.2°F | Ht 73.0 in | Wt 303.0 lb

## 2011-09-03 DIAGNOSIS — R7989 Other specified abnormal findings of blood chemistry: Secondary | ICD-10-CM

## 2011-09-03 DIAGNOSIS — E119 Type 2 diabetes mellitus without complications: Secondary | ICD-10-CM

## 2011-09-03 DIAGNOSIS — M109 Gout, unspecified: Secondary | ICD-10-CM | POA: Insufficient documentation

## 2011-09-03 LAB — HEPATIC FUNCTION PANEL
AST: 48 U/L — ABNORMAL HIGH (ref 0–37)
Albumin: 4.3 g/dL (ref 3.5–5.2)
Alkaline Phosphatase: 91 U/L (ref 39–117)
Bilirubin, Direct: 0.1 mg/dL (ref 0.0–0.3)
Total Bilirubin: 1 mg/dL (ref 0.3–1.2)

## 2011-09-03 LAB — CBC WITH DIFFERENTIAL/PLATELET
Basophils Relative: 0.7 % (ref 0.0–3.0)
Eosinophils Relative: 1.2 % (ref 0.0–5.0)
HCT: 42.4 % (ref 39.0–52.0)
Hemoglobin: 14.7 g/dL (ref 13.0–17.0)
Lymphs Abs: 1.4 10*3/uL (ref 0.7–4.0)
MCV: 86.3 fl (ref 78.0–100.0)
Monocytes Absolute: 0.5 10*3/uL (ref 0.1–1.0)
RBC: 4.92 Mil/uL (ref 4.22–5.81)
WBC: 6.9 10*3/uL (ref 4.5–10.5)

## 2011-09-03 LAB — BASIC METABOLIC PANEL
Chloride: 102 mEq/L (ref 96–112)
Potassium: 4.1 mEq/L (ref 3.5–5.1)

## 2011-09-03 MED ORDER — ALLOPURINOL 300 MG PO TABS: 300.0000 mg | ORAL_TABLET | Freq: Every day | ORAL | Status: DC

## 2011-09-03 MED ORDER — METFORMIN HCL 500 MG PO TABS: 500.0000 mg | ORAL_TABLET | Freq: Two times a day (BID) | ORAL | Status: DC

## 2011-09-03 NOTE — Assessment & Plan Note (Signed)
Has had elevated LFTs for many years Will recheck with hepatitis profile If still as high, will check ultrasound

## 2011-09-03 NOTE — Assessment & Plan Note (Signed)
Recurrent symptoms Joints quiet today Will start allopurinol Indocin for prn---this worked well

## 2011-09-03 NOTE — Assessment & Plan Note (Signed)
Has not really been able to lose weight Not exercising due to gout attacks  Will start metformin

## 2011-09-03 NOTE — Patient Instructions (Signed)
Please start the allopurinol daily as gout preventative. You may need the indomethacin when you start as occasionally it can cause a bit of a gout flare at first. Please start the metformin twice a day (before breakfast and supper) for the diabetes

## 2011-09-03 NOTE — Progress Notes (Signed)
Subjective:    Patient ID: Charles Phelps, male    DOB: [birth year], 46 y.o.   MRN: [id number]  HPI Did go to the diabetic couselling at [facility] the program  Having trouble figuring out what his diet should be Feels that this has exacerbated his gout In foot and ER visit for wrist attack recently  Joined gym but then couldn't work out due to gout in [employer] otherwise is okay---some anxiety  No nausea or vomiting Appetite is fine Bowels are okay  Current Outpatient Prescriptions on File Prior to Visit  Medication Sig Dispense Refill  . cetirizine (ZYRTEC) 10 MG tablet Take 10 mg by mouth daily.        . citalopram (CELEXA) 20 MG tablet TAKE 1 TABLET DAILY  90 tablet  2  . hyoscyamine (LEVBID) 0.375 MG 12 hr tablet Take 0.375 mg by mouth daily.        Marland Kitchen lisinopril (PRINIVIL,ZESTRIL) 20 MG tablet Take 1 tablet (20 mg total) by mouth daily.  90 tablet  3  . Multiple Vitamin (ONE-A-DAY MENS) TABS Take by mouth.        Marland Kitchen omeprazole (PRILOSEC) 20 MG capsule Take 1 capsule (20 mg total) by mouth daily. BRAND NAME ONLY  30 capsule  11  . traZODone (DESYREL) 100 MG tablet 50 mg. 1/2 - 1 by mouth at bedtime as needed to help sleep        No Known Allergies  Past Medical History  Diagnosis Date  . Allergic rhinitis   . Anxiety   . GERD (gastroesophageal reflux disease)   . Hyperlipidemia   . Elevated LFTs 2000    fatty liver  . Osteoarthritis   . Glucose intolerance (impaired glucose tolerance)   . Obstructive sleep apnea   . Hypertension   . IBS (irritable bowel syndrome)     No past surgical history on file.  Family History  Problem Relation Age of Onset  . Hypertension Mother   . Diabetes Mother   . Heart disease Father     CABG at 10  . Diabetes Sister   . Diabetes Brother     History   Social History  . Marital Status: Married    Spouse Name: N/A    Number of Children: 1  . Years of Education: N/A   Occupational History  .  Industrial / Proofreader     Social History Main Topics  . Smoking status: Never Smoker   . Smokeless tobacco: Not on file  . Alcohol Use: Yes     Occasionally  . Drug Use: No  . Sexually Active: Not on file   Other Topics Concern  . Not on file   Social History Narrative  . No narrative on file     Review of Systems Weight is stable Sleeps okay      Objective:   Physical Exam  Constitutional: He appears well-developed and well-nourished. No distress.  Neck: Normal range of motion. Neck supple. No thyromegaly present.  Cardiovascular: Normal rate, regular rhythm, normal heart sounds and intact distal pulses.  Exam reveals no gallop.   No murmur heard. Pulmonary/Chest: Effort normal and breath sounds normal. No respiratory distress. He has no wheezes. He has no rales.  Abdominal: Soft. He exhibits no mass. There is no tenderness.       No hepatomegaly  Musculoskeletal: He exhibits no edema and no tenderness.  Lymphadenopathy:    He has no cervical adenopathy.  Psychiatric: He  has a normal mood and affect. His behavior is normal. Judgment and thought content normal.          Assessment & Plan:

## 2011-12-03 ENCOUNTER — Ambulatory Visit: Payer: BC Managed Care – PPO | Admitting: Internal Medicine

## 2012-02-05 ENCOUNTER — Other Ambulatory Visit: Payer: Self-pay | Admitting: Internal Medicine

## 2012-02-27 ENCOUNTER — Other Ambulatory Visit: Payer: Self-pay | Admitting: Internal Medicine

## 2012-02-27 NOTE — Telephone Encounter (Signed)
Okay to refill for a year  He is due for his physical. Have him set this up in the next few months

## 2012-02-27 NOTE — Telephone Encounter (Signed)
Ok to refill 

## 2012-02-27 NOTE — Telephone Encounter (Signed)
Rx sent in to pharmacy. 

## 2012-02-28 ENCOUNTER — Other Ambulatory Visit: Payer: Self-pay | Admitting: *Deleted

## 2012-02-28 MED ORDER — OMEPRAZOLE 20 MG PO CPDR: 20.0000 mg | DELAYED_RELEASE_CAPSULE | Freq: Every day | ORAL | Status: DC

## 2012-04-29 ENCOUNTER — Other Ambulatory Visit: Payer: Self-pay | Admitting: Internal Medicine

## 2012-08-05 ENCOUNTER — Ambulatory Visit (INDEPENDENT_AMBULATORY_CARE_PROVIDER_SITE_OTHER): Payer: BC Managed Care – PPO | Admitting: Internal Medicine

## 2012-08-05 ENCOUNTER — Encounter: Payer: Self-pay | Admitting: Internal Medicine

## 2012-08-05 VITALS — BP 122/88 | HR 102 | Temp 98.0°F | Wt 302.0 lb

## 2012-08-05 DIAGNOSIS — J069 Acute upper respiratory infection, unspecified: Secondary | ICD-10-CM

## 2012-08-05 DIAGNOSIS — I10 Essential (primary) hypertension: Secondary | ICD-10-CM

## 2012-08-05 DIAGNOSIS — E119 Type 2 diabetes mellitus without complications: Secondary | ICD-10-CM

## 2012-08-05 LAB — HEMOGLOBIN A1C: Hgb A1c MFr Bld: 7.2 % — ABNORMAL HIGH (ref 4.6–6.5)

## 2012-08-05 LAB — BASIC METABOLIC PANEL
BUN: 13 mg/dL (ref 6–23)
CO2: 29 mEq/L (ref 19–32)
Calcium: 9.1 mg/dL (ref 8.4–10.5)
Creatinine, Ser: 1.1 mg/dL (ref 0.4–1.5)

## 2012-08-05 MED ORDER — AMOXICILLIN 500 MG PO TABS: 1000.0000 mg | ORAL_TABLET | Freq: Two times a day (BID) | ORAL | Status: DC

## 2012-08-05 NOTE — Progress Notes (Signed)
Subjective:    Patient ID: Charles Phelps, male    DOB: 1966/07/06, 47 y.o.   MRN: [id number]  HPI Here for sick visit Bad sore throat, ear pain, head congestion, cough, eye watering Body aches  No fever Started 2 days ago Didn't have flu shot Headache kept him up the first night No SOB Having massive amounts of PND---"lumps of stuff"  Taking dayquil and tylenol PM at night  Not checking sugars Now on metformin  No problems with this No hypoglycemic spells  Current Outpatient Prescriptions on File Prior to Visit  Medication Sig Dispense Refill  . cetirizine (ZYRTEC) 10 MG tablet Take 10 mg by mouth daily.        . citalopram (CELEXA) 20 MG tablet TAKE 1 TABLET DAILY  90 tablet  11  . hyoscyamine (LEVBID) 0.375 MG 12 hr tablet TAKE 1 TABLET TWICE A DAY AS NEEDED  180 tablet  2  . lisinopril (PRINIVIL,ZESTRIL) 20 MG tablet TAKE ONE TABLET BY MOUTH EVERY DAY  90 tablet  3  . metFORMIN (GLUCOPHAGE) 500 MG tablet Take 1 tablet (500 mg total) by mouth 2 (two) times daily with a meal.  180 tablet  3  . Multiple Vitamin (ONE-A-DAY MENS) TABS Take by mouth.        Marland Kitchen omeprazole (PRILOSEC) 20 MG capsule Take 1 capsule (20 mg total) by mouth daily. BRAND NAME ONLY  30 capsule  11  . traZODone (DESYREL) 100 MG tablet 50 mg. 1/2 - 1 by mouth at bedtime as needed to help sleep        No Known Allergies  Past Medical History  Diagnosis Date  . Allergic rhinitis   . Anxiety   . GERD (gastroesophageal reflux disease)   . Hyperlipidemia   . Elevated LFTs 2000    fatty liver  . Osteoarthritis   . Glucose intolerance (impaired glucose tolerance)   . Obstructive sleep apnea   . Hypertension   . IBS (irritable bowel syndrome)     No past surgical history on file.  Family History  Problem Relation Age of Onset  . Hypertension Mother   . Diabetes Mother   . Heart disease Father     CABG at 56  . Diabetes Sister   . Diabetes Brother     History   Social History  . Marital  Status: Married    Spouse Name: N/A    Number of Children: 1  . Years of Education: N/A   Occupational History  . Industrial / Proofreader     Social History Main Topics  . Smoking status: Never Smoker   . Smokeless tobacco: Never Used  . Alcohol Use: Yes     Comment: Occasionally  . Drug Use: No  . Sexually Active: Not on file   Other Topics Concern  . Not on file   Social History Narrative  . No narrative on file   Review of Systems No rash No vomiting or diarrhea Some burping and flactulence    Objective:   Physical Exam  Constitutional: He appears well-developed and well-nourished. No distress.       Appears mildly ill  HENT:  Mouth/Throat: Oropharynx is clear and moist. No oropharyngeal exudate.       No sinus tenderness TMs without inflammation Moderate nasal inflammation  Neck: Normal range of motion. Neck supple.  Cardiovascular: Normal rate, regular rhythm and normal heart sounds.  Exam reveals no gallop.   No murmur heard. Pulmonary/Chest: Effort normal  and breath sounds normal. No respiratory distress. He has no wheezes. He has no rales.  Musculoskeletal: He exhibits no edema.  Lymphadenopathy:    He has no cervical adenopathy.  Psychiatric: He has a normal mood and affect. His behavior is normal.          Assessment & Plan:

## 2012-08-05 NOTE — Patient Instructions (Signed)
Please start the amoxicillin antibiotic if you are feeling worse, instead of better, by this weekend or early next week

## 2012-08-05 NOTE — Assessment & Plan Note (Signed)
On the metformin Will check his control

## 2012-08-05 NOTE — Assessment & Plan Note (Signed)
BP Readings from Last 3 Encounters:  08/05/12 122/88  09/03/11 120/80  07/12/11 158/94   Still good control No changes Check met b

## 2012-08-05 NOTE — Assessment & Plan Note (Signed)
Likely the flu Discussed viral etiology Supportive care---mostly analgesics Some sinus component---if worsens in a few days, will start amoxil

## 2012-08-10 ENCOUNTER — Encounter: Payer: Self-pay | Admitting: *Deleted

## 2012-08-31 LAB — HM DIABETES EYE EXAM

## 2012-09-12 ENCOUNTER — Other Ambulatory Visit: Payer: Self-pay

## 2012-12-28 ENCOUNTER — Encounter: Payer: Self-pay | Admitting: Family Medicine

## 2012-12-28 ENCOUNTER — Ambulatory Visit (INDEPENDENT_AMBULATORY_CARE_PROVIDER_SITE_OTHER): Payer: BC Managed Care – PPO | Admitting: Family Medicine

## 2012-12-28 VITALS — HR 83 | Temp 98.4°F | Ht 73.0 in | Wt 305.8 lb

## 2012-12-28 DIAGNOSIS — L0292 Furuncle, unspecified: Secondary | ICD-10-CM

## 2012-12-28 DIAGNOSIS — L0293 Carbuncle, unspecified: Secondary | ICD-10-CM

## 2012-12-28 MED ORDER — METFORMIN HCL 500 MG PO TABS: 500.0000 mg | ORAL_TABLET | Freq: Two times a day (BID) | ORAL | Status: DC

## 2012-12-28 MED ORDER — DOXYCYCLINE HYCLATE 100 MG PO TABS: 100.0000 mg | ORAL_TABLET | Freq: Two times a day (BID) | ORAL | Status: DC

## 2012-12-28 NOTE — Progress Notes (Signed)
Nature conservation officer at [facility] [address] [place] [address] Phone: [phone] Fax: [phone]  Date:  12/28/2012   Name:  Charles Phelps   DOB:  August 16, 1965   MRN:  [id number] Gender: male Age: 47 y.o.  Primary Physician:  Tillman Abide, MD  Evaluating MD: Hannah Beat, MD  Chief Complaint: sore on leg   History of Present Illness:  Charles Phelps is a 47 y.o. very pleasant male patient who presents with the following:  Had a bump on leg, then it got worsee and wife thought that meant it was a boil. Has been there for about a month. He has had multiple, on the buttocks and also now has 2 on the inner thigh / groin. Mildly tender. Discolored somewhat as well. No fever, chills, or sweats.   Past Medical History, Surgical History, Social History, Family History, Problem List, Medications, and Allergies have been reviewed and updated if relevant.  Current Outpatient Prescriptions on File Prior to Visit  Medication Sig Dispense Refill  . cetirizine (ZYRTEC) 10 MG tablet Take 10 mg by mouth daily.        . citalopram (CELEXA) 20 MG tablet TAKE 1 TABLET DAILY  90 tablet  11  . hyoscyamine (LEVBID) 0.375 MG 12 hr tablet TAKE 1 TABLET TWICE A DAY AS NEEDED  180 tablet  2  . lisinopril (PRINIVIL,ZESTRIL) 20 MG tablet TAKE ONE TABLET BY MOUTH EVERY DAY  90 tablet  3  . Multiple Vitamin (ONE-A-DAY MENS) TABS Take by mouth.        Marland Kitchen omeprazole (PRILOSEC) 20 MG capsule Take 1 capsule (20 mg total) by mouth daily. BRAND NAME ONLY  30 capsule  11   No current facility-administered medications on file prior to visit.    Review of Systems:  GEN: No acute illnesses, no fevers, chills. GI: No n/v/d, eating normally Pulm: No SOB Interactive and getting along well at home.  Otherwise, ROS is as per the HPI.   Physical Examination: Pulse 83  Temp(Src) 98.4 F (36.9 C) (Oral)  Ht 6\' 1"  (1.854 m)  Wt 305 lb 12 oz (138.687 kg)  BMI 40.35 kg/m2  SpO2 97%   GEN:  A and O x 3. WDWN. NAD.    ENT: Nose clear, ext NML.  No LAD.  No JVD.  TM's clear. Oropharynx clear.  PULM: Normal WOB, no distress. No crackles, wheezes, rhonchi. CV: RRR, no M/G/R, No rubs, No JVD.   EXT: warm and well-perfused, No c/c/e. PSYCH: Pleasant and conversant. SKIN: multiple boils at various stages of healing. No fluctuance. No pus. Skin broken on 2-3. Mild tenderness on 2-3.  Assessment and Plan:  Recurrent boils  C/w boils, healing at various stages.   Orders Today:  No orders of the defined types were placed in this encounter.    Updated Medication List: (Includes new medications, updates to list, dose adjustments) Meds ordered this encounter  Medications  . metFORMIN (GLUCOPHAGE) 500 MG tablet    Sig: Take 1 tablet (500 mg total) by mouth 2 (two) times daily with a meal.    Dispense:  180 tablet    Refill:  0  . doxycycline (VIBRA-TABS) 100 MG tablet    Sig: Take 1 tablet (100 mg total) by mouth 2 (two) times daily.    Dispense:  28 tablet    Refill:  0    Medications Discontinued: Medications Discontinued During This Encounter  Medication Reason  . amoxicillin (AMOXIL) 500 MG  tablet Error  . traZODone (DESYREL) 100 MG tablet Error  . metFORMIN (GLUCOPHAGE) 500 MG tablet Reorder      Signed, Karleen Hampshire T. Dalilah Curlin, MD 12/28/2012 4:33 PM

## 2012-12-28 NOTE — Patient Instructions (Addendum)
Achilles Rehab  Begin with easy walking, heel, toe and backwards  Calf raises on a step First lower and then raise on 1 foot If this is painful lower on 1 foot but do the heel raise on both feet  Begin with 3 sets of 10 repetitions  Increase by 5 repetitions every 3 days  Goal is 3 sets of 30 repetitions  Do with both knee straight and knee at 20 degrees of flexion  If pain persists at 3 sets of 30 - add backpack with 5 lbs Increase by 5 lbs per week to max of 30 lbs  

## 2013-01-01 ENCOUNTER — Other Ambulatory Visit: Payer: Self-pay | Admitting: Internal Medicine

## 2013-02-02 ENCOUNTER — Other Ambulatory Visit: Payer: Self-pay | Admitting: Family Medicine

## 2013-02-02 MED ORDER — LISINOPRIL 20 MG PO TABS: 20.0000 mg | ORAL_TABLET | Freq: Every day | ORAL | Status: DC

## 2013-02-02 MED ORDER — METFORMIN HCL 500 MG PO TABS: 500.0000 mg | ORAL_TABLET | Freq: Two times a day (BID) | ORAL | Status: DC

## 2013-02-02 NOTE — Telephone Encounter (Signed)
rx sent to pharmacy by e-script  

## 2013-02-02 NOTE — Telephone Encounter (Signed)
.  dee

## 2013-03-08 ENCOUNTER — Other Ambulatory Visit: Payer: Self-pay | Admitting: Internal Medicine

## 2013-04-06 ENCOUNTER — Other Ambulatory Visit: Payer: Self-pay | Admitting: Internal Medicine

## 2013-04-12 ENCOUNTER — Other Ambulatory Visit: Payer: Self-pay | Admitting: Internal Medicine

## 2013-04-12 MED ORDER — OMEPRAZOLE 20 MG PO CPDR: 20.0000 mg | DELAYED_RELEASE_CAPSULE | Freq: Every day | ORAL | Status: DC

## 2013-04-12 NOTE — Telephone Encounter (Signed)
Okay to refill for a year 

## 2013-04-12 NOTE — Telephone Encounter (Signed)
Ok to refill 

## 2013-05-04 ENCOUNTER — Other Ambulatory Visit: Payer: Self-pay | Admitting: Internal Medicine

## 2013-05-05 MED ORDER — LISINOPRIL 20 MG PO TABS: 20.0000 mg | ORAL_TABLET | Freq: Every day | ORAL | Status: DC

## 2013-06-03 ENCOUNTER — Other Ambulatory Visit: Payer: Self-pay

## 2013-06-07 ENCOUNTER — Encounter: Payer: Self-pay | Admitting: Internal Medicine

## 2013-06-14 ENCOUNTER — Ambulatory Visit (INDEPENDENT_AMBULATORY_CARE_PROVIDER_SITE_OTHER): Payer: BC Managed Care – PPO | Admitting: Internal Medicine

## 2013-06-14 ENCOUNTER — Encounter: Payer: Self-pay | Admitting: Internal Medicine

## 2013-06-14 VITALS — BP 130/90 | HR 74 | Temp 98.2°F | Ht 73.0 in | Wt 301.0 lb

## 2013-06-14 DIAGNOSIS — E785 Hyperlipidemia, unspecified: Secondary | ICD-10-CM

## 2013-06-14 DIAGNOSIS — E119 Type 2 diabetes mellitus without complications: Secondary | ICD-10-CM

## 2013-06-14 DIAGNOSIS — Z0000 Encounter for general adult medical examination without abnormal findings: Secondary | ICD-10-CM

## 2013-06-14 DIAGNOSIS — I10 Essential (primary) hypertension: Secondary | ICD-10-CM

## 2013-06-14 DIAGNOSIS — F411 Generalized anxiety disorder: Secondary | ICD-10-CM

## 2013-06-14 LAB — BASIC METABOLIC PANEL
CO2: 30 mEq/L (ref 19–32)
Calcium: 9.4 mg/dL (ref 8.4–10.5)
Chloride: 100 mEq/L (ref 96–112)
Glucose, Bld: 125 mg/dL — ABNORMAL HIGH (ref 70–99)
Potassium: 3.8 mEq/L (ref 3.5–5.1)
Sodium: 137 mEq/L (ref 135–145)

## 2013-06-14 LAB — CBC WITH DIFFERENTIAL/PLATELET
Basophils Relative: 0.7 % (ref 0.0–3.0)
Eosinophils Absolute: 0.1 10*3/uL (ref 0.0–0.7)
Eosinophils Relative: 0.7 % (ref 0.0–5.0)
Hemoglobin: 14.6 g/dL (ref 13.0–17.0)
Lymphocytes Relative: 23 % (ref 12.0–46.0)
MCHC: 34.4 g/dL (ref 30.0–36.0)
Monocytes Relative: 7.6 % (ref 3.0–12.0)
Neutro Abs: 5.4 10*3/uL (ref 1.4–7.7)
Platelets: 224 10*3/uL (ref 150.0–400.0)
RBC: 5.06 Mil/uL (ref 4.22–5.81)

## 2013-06-14 LAB — HEPATIC FUNCTION PANEL
ALT: 95 U/L — ABNORMAL HIGH (ref 0–53)
AST: 66 U/L — ABNORMAL HIGH (ref 0–37)
Alkaline Phosphatase: 96 U/L (ref 39–117)
Bilirubin, Direct: 0 mg/dL (ref 0.0–0.3)
Total Protein: 7.4 g/dL (ref 6.0–8.3)

## 2013-06-14 LAB — LIPID PANEL: Total CHOL/HDL Ratio: 6

## 2013-06-14 LAB — HM DIABETES FOOT EXAM

## 2013-06-14 MED ORDER — METFORMIN HCL 500 MG PO TABS: 500.0000 mg | ORAL_TABLET | Freq: Two times a day (BID) | ORAL | Status: DC

## 2013-06-14 MED ORDER — CITALOPRAM HYDROBROMIDE 20 MG PO TABS: 20.0000 mg | ORAL_TABLET | Freq: Every day | ORAL | Status: DC

## 2013-06-14 MED ORDER — ATORVASTATIN CALCIUM 20 MG PO TABS: 20.0000 mg | ORAL_TABLET | Freq: Every day | ORAL | Status: DC

## 2013-06-14 MED ORDER — LISINOPRIL 20 MG PO TABS: 20.0000 mg | ORAL_TABLET | Freq: Every day | ORAL | Status: DC

## 2013-06-14 MED ORDER — OMEPRAZOLE 20 MG PO CPDR: 20.0000 mg | DELAYED_RELEASE_CAPSULE | Freq: Every day | ORAL | Status: DC

## 2013-06-14 MED ORDER — HYOSCYAMINE SULFATE ER 0.375 MG PO TB12: 0.3750 mg | ORAL_TABLET | Freq: Two times a day (BID) | ORAL | Status: DC | PRN

## 2013-06-14 NOTE — Progress Notes (Signed)
Pre-visit discussion using our clinic review tool. No additional management support is needed unless otherwise documented below in the visit note.  

## 2013-06-14 NOTE — Assessment & Plan Note (Signed)
Discussed current recommendations Will start atorvastatin Check labs in 2 months

## 2013-06-14 NOTE — Assessment & Plan Note (Signed)
Worse with dad's lung cancer Will continue the med

## 2013-06-14 NOTE — Progress Notes (Signed)
Subjective:    Patient ID: Charles Phelps, male    DOB: 05-19-1966, 47 y.o.   MRN: [id number]  HPI Here for physical  Still doesn't check sugars No foot numbness or ulcers Ongoing Achilles tendonitis No hypoglycemic reactions Keeps up with eye exams Now has a fit bit---plans to restart walking Trying to eat better  Anxiety persists Lots of stress Dad with new diagnosis of lung cancer Continues on the med Tired from "mental distress"  Current Outpatient Prescriptions on File Prior to Visit  Medication Sig Dispense Refill  . cetirizine (ZYRTEC) 10 MG tablet Take 10 mg by mouth daily.        . Multiple Vitamin (ONE-A-DAY MENS) TABS Take by mouth.         No current facility-administered medications on file prior to visit.    No Known Allergies  Past Medical History  Diagnosis Date  . Allergic rhinitis   . Anxiety   . GERD (gastroesophageal reflux disease)   . Hyperlipidemia   . Elevated LFTs 2000    fatty liver  . Osteoarthritis   . Glucose intolerance (impaired glucose tolerance)   . Obstructive sleep apnea   . Hypertension   . IBS (irritable bowel syndrome)     No past surgical history on file.  Family History  Problem Relation Age of Onset  . Hypertension Mother   . Diabetes Mother   . Heart disease Father     CABG at 61  . Cancer Father     lung  . Diabetes Sister   . Diabetes Brother     History   Social History  . Marital Status: Married    Spouse Name: N/A    Number of Children: 1  . Years of Education: N/A   Occupational History  . Industrial / Proofreader     Social History Main Topics  . Smoking status: Never Smoker   . Smokeless tobacco: Never Used  . Alcohol Use: Yes     Comment: Occasionally  . Drug Use: No  . Sexual Activity: Not on file   Other Topics Concern  . Not on file   Social History Narrative  . No narrative on file   Review of Systems  Constitutional: Negative for fatigue and unexpected weight change.        Wears seat belt  HENT: Negative for congestion, dental problem, hearing loss, rhinorrhea and tinnitus.        Regular with dentist  Eyes: Negative for visual disturbance.       No diplopia or unilateral vision loss  Respiratory: Negative for cough, chest tightness and shortness of breath.   Cardiovascular: Negative for chest pain, palpitations and leg swelling.  Gastrointestinal: Negative for nausea, vomiting, abdominal pain, constipation and blood in stool.       No heartburn on the omeprazole  Endocrine: Negative for cold intolerance and heat intolerance.  Genitourinary: Negative for urgency, frequency and difficulty urinating.       No sexual problems  Musculoskeletal: Positive for arthralgias and back pain. Negative for joint swelling.       Achilles and secondary back pain from walking troubles  Skin: Negative for rash.  Allergic/Immunologic: Negative for environmental allergies and immunocompromised state.  Neurological: Negative for dizziness, syncope, light-headedness, numbness and headaches.  Hematological: Negative for adenopathy. Does not bruise/bleed easily.  Psychiatric/Behavioral: Negative for sleep disturbance and dysphoric mood. The patient is nervous/anxious.        Objective:   Physical Exam  Constitutional: He is oriented to person, place, and time. He appears well-developed and well-nourished. No distress.  HENT:  Head: Normocephalic and atraumatic.  Right Ear: External ear normal.  Left Ear: External ear normal.  Mouth/Throat: Oropharynx is clear and moist. No oropharyngeal exudate.  Eyes: Conjunctivae and EOM are normal. Pupils are equal, round, and reactive to light.  Neck: Normal range of motion. Neck supple. No thyromegaly present.  Cardiovascular: Normal rate, regular rhythm, normal heart sounds and intact distal pulses.  Exam reveals no gallop.   No murmur heard. Pulmonary/Chest: Effort normal and breath sounds normal. No respiratory distress. He  has no wheezes. He has no rales.  Abdominal: Soft. There is no tenderness.  Musculoskeletal: He exhibits no edema and no tenderness.  Lymphadenopathy:    He has no cervical adenopathy.  Neurological: He is alert and oriented to person, place, and time.  Skin: No rash noted. No erythema.  Psychiatric: He has a normal mood and affect. His behavior is normal.          Assessment & Plan:

## 2013-06-14 NOTE — Assessment & Plan Note (Signed)
Discussed fitness--- trying to get back to exercise now that his Achilles is improved Prefers no flu shot or pneumovax

## 2013-06-14 NOTE — Assessment & Plan Note (Signed)
BP Readings from Last 3 Encounters:  06/14/13 130/90  08/05/12 122/88  09/03/11 120/80   Adequate control Due for labs

## 2013-06-14 NOTE — Patient Instructions (Signed)
Please set up blood work in about 2 months---- lipid, hepatic (272.4)

## 2013-06-14 NOTE — Assessment & Plan Note (Signed)
Hopefully still well controlled Needs labs

## 2013-07-14 ENCOUNTER — Encounter: Payer: Self-pay | Admitting: Family Medicine

## 2013-07-14 ENCOUNTER — Telehealth: Payer: Self-pay | Admitting: Internal Medicine

## 2013-07-14 ENCOUNTER — Ambulatory Visit (INDEPENDENT_AMBULATORY_CARE_PROVIDER_SITE_OTHER): Payer: BC Managed Care – PPO | Admitting: Family Medicine

## 2013-07-14 VITALS — BP 140/70 | HR 83 | Temp 98.7°F | Ht 73.0 in | Wt 307.8 lb

## 2013-07-14 DIAGNOSIS — R0789 Other chest pain: Secondary | ICD-10-CM

## 2013-07-14 DIAGNOSIS — R071 Chest pain on breathing: Secondary | ICD-10-CM

## 2013-07-14 MED ORDER — BENZONATATE 200 MG PO CAPS: 200.0000 mg | ORAL_CAPSULE | Freq: Two times a day (BID) | ORAL | Status: DC | PRN

## 2013-07-14 MED ORDER — HYDROCODONE-HOMATROPINE 5-1.5 MG/5ML PO SYRP: ORAL_SOLUTION | ORAL | Status: DC

## 2013-07-14 NOTE — Telephone Encounter (Signed)
Will await office evaluation

## 2013-07-14 NOTE — Progress Notes (Signed)
Pre-visit discussion using our clinic review tool. No additional management support is needed unless otherwise documented below in the visit note.  

## 2013-07-14 NOTE — Progress Notes (Signed)
Date:  07/14/2013   Name:  Charles Phelps   DOB:  04/29/1966   MRN:  [id number] Gender: male Age: 47 y.o.  Primary Physician:  Tillman Abide, MD   Chief Complaint: Cough and Knot on Left Side   Subjective:   History of Present Illness:  Charles Phelps is a 47 y.o. very pleasant male patient who presents with the following:  Getting in car yesterday, and had a cold on Friday. Yesterday, coughing and pulled something in his side. On the left side.  This is really hurting him quite a bit, quantity cough, difficulty with movement, and pain with any type of motion at all. It is all primarily in the LEFT side and near his lowest rib. There is even some degree of swelling in this area, but he does not have bruising. 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    Past Medical History, Surgical History, Social History, Family History, Problem List, Medications, and Allergies have been reviewed and updated if relevant.  Review of Systems: ROS: GEN: Acute illness details above GI: Tolerating PO intake GU: maintaining adequate hydration and urination Pulm: No SOB Interactive and getting along well at home.  Otherwise, ROS is as per the HPI.   Objective:   Physical Examination: BP 140/70  Pulse 83  Temp(Src) 98.7 F (37.1 C) (Oral)  Ht 6\' 1"  (1.854 m)  Wt 307 lb 12 oz (139.594 kg)  BMI 40.61 kg/m2   GEN: WDWN, NAD, Non-toxic, Alert & Oriented x 3 HEENT: Atraumatic, Normocephalic.  Ears and Nose: No external deformity. EXTR: No clubbing/cyanosis/edema NEURO: Normal gait.  PSYCH: Normally interactive. Conversant. Not depressed or anxious appearing.  Calm demeanor.   Chest wall: Patient is notably tender around his 10th 11th when it rib on the LEFT side anteriorly, and also in the musculature beneath his ribs. There is a little bit of some swelling in this area.   Laboratory and Imaging Data:  Assessment & Plan:    Acute chest wall pain  Differential diagnosis includes  acute intercostal muscle rupture, rib fracture, rectus abdominis or oblique tear.  Symptomatic care. Complications. Ethelene Browns give him some Hycodan to use both as a cough suppressant and as a pain medication.  Anticipate likely 3-5 week time period for recovery.  Patient Instructions    Orders Today:  No orders of the defined types were placed in this encounter.    New medications, updates to list, dose adjustments: Meds ordered this encounter  Medications  . benzonatate (TESSALON) 200 MG capsule    Sig: Take 1 capsule (200 mg total) by mouth 2 (two) times daily as needed for cough.    Dispense:  50 capsule    Refill:  1  . HYDROcodone-homatropine (HYCODAN) 5-1.5 MG/5ML syrup    Sig: 1 tsp po at night before bed prn cough    Dispense:  480 mL    Refill:  0    Signed,  Dymon Summerhill T. Gwynn Chalker, MD, CAQ Sports Medicine  [facility] [address] [place] [address] Phone: [phone] Fax: [phone]  Updated Complete Medication List:   Medication List       This list is accurate as of: 07/14/13 11:59 PM.  Always use your most recent med list.               aspirin EC 81 MG tablet  Take 81 mg by mouth every other day.     atorvastatin 20 MG tablet  Commonly  known as:  LIPITOR  Take 1 tablet (20 mg total) by mouth daily.     benzonatate 200 MG capsule  Commonly known as:  TESSALON  Take 1 capsule (200 mg total) by mouth 2 (two) times daily as needed for cough.     cetirizine 10 MG tablet  Commonly known as:  ZYRTEC  Take 10 mg by mouth daily.     citalopram 20 MG tablet  Commonly known as:  CELEXA  Take 1 tablet (20 mg total) by mouth daily.     HYDROcodone-homatropine 5-1.5 MG/5ML syrup  Commonly known as:  HYCODAN  1 tsp po at night before bed prn cough     hyoscyamine 0.375 MG 12 hr tablet  Commonly known as:  LEVBID  Take 1 tablet (0.375 mg total) by mouth 2 (two) times daily as needed.     lisinopril 20 MG tablet    Commonly known as:  PRINIVIL,ZESTRIL  Take 1 tablet (20 mg total) by mouth daily.     metFORMIN 500 MG tablet  Commonly known as:  GLUCOPHAGE  Take 1 tablet (500 mg total) by mouth 2 (two) times daily with a meal.     multivitamin Tabs tablet  Take by mouth.     omeprazole 20 MG capsule  Commonly known as:  PRILOSEC  Take 1 capsule (20 mg total) by mouth daily.

## 2013-07-14 NOTE — Telephone Encounter (Signed)
Patient Information:  Caller Name: Charles Phelps  Phone: [phone]  Patient: Charles Phelps, Charles Phelps  Gender: Male  DOB: 22-Feb-1966  Age: 47 Years  PCP: Tillman Abide [facility])  Office Follow Up:  Does the office need to follow up with this patient?: No  Instructions For The Office: N/A   Symptoms  Reason For Call & Symptoms: Charles Phelps says he has had cold sxs and was coughing yesterday; during a coughing episode, their was a sharp pain on the left; still sore; unable to take a deep breath, cough or sneeze w/o pain  Reviewed Health History In EMR: Yes  Reviewed Medications In EMR: Yes  Reviewed Allergies In EMR: Yes  Reviewed Surgeries / Procedures: Yes  Date of Onset of Symptoms: 07/13/2013  Guideline(s) Used:  Chest Pain  Disposition Per Guideline:   See Today in Office  Reason For Disposition Reached:   All other patients with chest pain  Advice Given:  N/A  Patient Will Follow Care Advice:  YES  Appointment Scheduled:  07/14/2013 15:15:00 Appointment Scheduled Provider:  Hannah Beat (Family Practice)

## 2013-08-12 ENCOUNTER — Other Ambulatory Visit (INDEPENDENT_AMBULATORY_CARE_PROVIDER_SITE_OTHER): Payer: BC Managed Care – PPO

## 2013-08-12 DIAGNOSIS — E785 Hyperlipidemia, unspecified: Secondary | ICD-10-CM

## 2013-08-12 LAB — LIPID PANEL
Cholesterol: 115 mg/dL (ref 0–200)
HDL: 37.8 mg/dL — AB (ref >39.00)
LDL Cholesterol: 58 mg/dL (ref 0–99)
Total CHOL/HDL Ratio: 3
Triglycerides: 95 mg/dL (ref 0.0–149.0)
VLDL: 19 mg/dL (ref 0.0–40.0)

## 2013-08-12 LAB — HEPATIC FUNCTION PANEL
ALBUMIN: 4.1 g/dL (ref 3.5–5.2)
ALT: 81 U/L — AB (ref 0–53)
AST: 48 U/L — ABNORMAL HIGH (ref 0–37)
Alkaline Phosphatase: 99 U/L (ref 39–117)
Bilirubin, Direct: 0.2 mg/dL (ref 0.0–0.3)
Total Bilirubin: 1.2 mg/dL (ref 0.3–1.2)
Total Protein: 7 g/dL (ref 6.0–8.3)

## 2013-08-27 LAB — HM DIABETES EYE EXAM

## 2013-12-13 ENCOUNTER — Ambulatory Visit (INDEPENDENT_AMBULATORY_CARE_PROVIDER_SITE_OTHER): Payer: BC Managed Care – PPO | Admitting: Internal Medicine

## 2013-12-13 ENCOUNTER — Encounter: Payer: Self-pay | Admitting: Internal Medicine

## 2013-12-13 VITALS — BP 120/80 | HR 80 | Temp 98.4°F | Wt 308.0 lb

## 2013-12-13 DIAGNOSIS — I10 Essential (primary) hypertension: Secondary | ICD-10-CM

## 2013-12-13 DIAGNOSIS — E785 Hyperlipidemia, unspecified: Secondary | ICD-10-CM

## 2013-12-13 DIAGNOSIS — E119 Type 2 diabetes mellitus without complications: Secondary | ICD-10-CM

## 2013-12-13 DIAGNOSIS — F411 Generalized anxiety disorder: Secondary | ICD-10-CM

## 2013-12-13 NOTE — Progress Notes (Signed)
Subjective:    Patient ID: Charles Phelps, male    DOB: 1965/09/22, 48 y.o.   MRN: [id number]  HPI Here for follow up Doing well Only problem is memory issues he relates to the atorvastatin Repeats himself Stress still. Dad died of the lung cancer. Mom needs help regularly  Ongoing heel pain Plans to go back to Dr Lorelei Pont for reevaluation Tried special sock--slight help.  Exercise with elastic band  Hasn't been checking sugars Doesn't have a machine now--will get for him No hypoglycemic reactions recently  No chest pain No SOB  Anxiety has been controlled Has been working through the grieving  Current Outpatient Prescriptions on File Prior to Visit  Medication Sig Dispense Refill  . aspirin EC 81 MG tablet Take 81 mg by mouth every other day.      . cetirizine (ZYRTEC) 10 MG tablet Take 10 mg by mouth daily.        . citalopram (CELEXA) 20 MG tablet Take 1 tablet (20 mg total) by mouth daily.  90 tablet  3  . hyoscyamine (LEVBID) 0.375 MG 12 hr tablet Take 1 tablet (0.375 mg total) by mouth 2 (two) times daily as needed.  180 tablet  3  . lisinopril (PRINIVIL,ZESTRIL) 20 MG tablet Take 1 tablet (20 mg total) by mouth daily.  30 tablet  1  . metFORMIN (GLUCOPHAGE) 500 MG tablet Take 1 tablet (500 mg total) by mouth 2 (two) times daily with a meal.  180 tablet  3  . Multiple Vitamin (ONE-A-DAY MENS) TABS Take by mouth.        Marland Kitchen omeprazole (PRILOSEC) 20 MG capsule Take 1 capsule (20 mg total) by mouth daily.  90 capsule  3   No current facility-administered medications on file prior to visit.    No Known Allergies  Past Medical History  Diagnosis Date  . Allergic rhinitis   . Anxiety   . GERD (gastroesophageal reflux disease)   . Hyperlipidemia   . Elevated LFTs 2000    fatty liver  . Osteoarthritis   . Glucose intolerance (impaired glucose tolerance)   . Obstructive sleep apnea   . Hypertension   . IBS (irritable bowel syndrome)     No past surgical  history on file.  Family History  Problem Relation Age of Onset  . Hypertension Mother   . Diabetes Mother   . Heart disease Father     CABG at 23  . Cancer Father     lung  . Diabetes Sister   . Diabetes Brother     History   Social History  . Marital Status: Married    Spouse Name: N/A    Number of Children: 1  . Years of Education: N/A   Occupational History  . Industrial / Geologist, engineering     Social History Main Topics  . Smoking status: Never Smoker   . Smokeless tobacco: Never Used  . Alcohol Use: Yes     Comment: Occasionally  . Drug Use: No  . Sexual Activity: Not on file   Other Topics Concern  . Not on file   Social History Narrative  . No narrative on file   Review of Systems Weight stable Sleeps well--with the CPAP    Objective:   Physical Exam  Constitutional: He appears well-developed and well-nourished. No distress.  Neck: Normal range of motion. Neck supple. No thyromegaly present.  Cardiovascular: Normal rate, regular rhythm, normal heart sounds and intact distal pulses.  Exam  reveals no gallop.   No murmur heard. Pulmonary/Chest: Effort normal and breath sounds normal. No respiratory distress. He has no wheezes. He has no rales.  Musculoskeletal: He exhibits no edema and no tenderness.  Lymphadenopathy:    He has no cervical adenopathy.  Skin: No rash noted. No erythema.  No foot sores   Psychiatric: He has a normal mood and affect. His behavior is normal.          Assessment & Plan:

## 2013-12-13 NOTE — Patient Instructions (Addendum)
Please try off the atorvastatin. If your memory comes back to normal, stay off it. If you don't notice any difference, go back on it.  Please set up an appt with Dr Lorelei Pont

## 2013-12-13 NOTE — Addendum Note (Signed)
Addended by: Ellamae Sia on: 12/13/2013 03:10 PM   Modules accepted: Orders

## 2013-12-13 NOTE — Assessment & Plan Note (Signed)
Memory issues seem likely to be from the atorvastatin He will stop If better, we will discuss another med next time

## 2013-12-13 NOTE — Assessment & Plan Note (Signed)
Controlled with his med 

## 2013-12-13 NOTE — Assessment & Plan Note (Signed)
BP Readings from Last 3 Encounters:  12/13/13 120/80  07/14/13 140/70  06/14/13 130/90   Good control No changes

## 2013-12-13 NOTE — Progress Notes (Signed)
Pre visit review using our clinic review tool, if applicable. No additional management support is needed unless otherwise documented below in the visit note. 

## 2013-12-13 NOTE — Assessment & Plan Note (Signed)
Hopefully still good control Will check A1c 

## 2013-12-14 ENCOUNTER — Telehealth: Payer: Self-pay | Admitting: *Deleted

## 2013-12-14 ENCOUNTER — Telehealth: Payer: Self-pay | Admitting: Internal Medicine

## 2013-12-14 ENCOUNTER — Other Ambulatory Visit: Payer: Self-pay | Admitting: *Deleted

## 2013-12-14 LAB — HEMOGLOBIN A1C: Hgb A1c MFr Bld: 8.6 % — ABNORMAL HIGH (ref 4.6–6.5)

## 2013-12-14 MED ORDER — METFORMIN HCL 1000 MG PO TABS: 1000.0000 mg | ORAL_TABLET | Freq: Two times a day (BID) | ORAL | Status: DC

## 2013-12-14 MED ORDER — GLUCOSE BLOOD VI STRP: ORAL_STRIP | Status: DC

## 2013-12-14 MED ORDER — ONETOUCH BASIC SYSTEM W/DEVICE KIT: PACK | Status: DC

## 2013-12-14 MED ORDER — ONETOUCH LANCETS MISC: Status: DC

## 2013-12-14 NOTE — Telephone Encounter (Signed)
rx sent to pharmacy by e-script  

## 2013-12-14 NOTE — Telephone Encounter (Signed)
Message copied by Despina Hidden on Tue Dec 14, 2013  4:11 PM ------      Message from: Viviana Simpler I      Created: Tue Dec 14, 2013  2:07 PM       Please call him      Confirm he is on metformin 500 bid      His control is considerably worse      He needs to check his sugars and do a better job with proper eating      Increase metformin to 1000mg  bid      Set up A1c again in about 3 months ------

## 2013-12-14 NOTE — Telephone Encounter (Signed)
Relevant patient education mailed to patient.  

## 2013-12-15 ENCOUNTER — Ambulatory Visit (INDEPENDENT_AMBULATORY_CARE_PROVIDER_SITE_OTHER): Payer: BC Managed Care – PPO | Admitting: Family Medicine

## 2013-12-15 ENCOUNTER — Encounter: Payer: Self-pay | Admitting: Family Medicine

## 2013-12-15 VITALS — BP 120/80 | HR 76 | Temp 98.3°F | Ht 73.0 in | Wt 303.5 lb

## 2013-12-15 DIAGNOSIS — M7660 Achilles tendinitis, unspecified leg: Secondary | ICD-10-CM

## 2013-12-15 DIAGNOSIS — M6788 Other specified disorders of synovium and tendon, other site: Secondary | ICD-10-CM

## 2013-12-15 MED ORDER — NITROGLYCERIN 0.2 MG/HR TD PT24: MEDICATED_PATCH | TRANSDERMAL | Status: DC

## 2013-12-15 NOTE — Patient Instructions (Addendum)
Achilles Rehab  Begin with easy walking, heel, toe and backwards  Calf raises on a step First lower and then raise on 1 foot If this is painful lower on 1 foot but do the heel raise on both feet  Begin with 3 sets of 10 repetitions  Increase by 5 repetitions every 3 days  Goal is 3 sets of 30 repetitions  Do with both knee straight and knee at 20 degrees of flexion  If pain persists at 3 sets of 30 - add backpack with 5 lbs Increase by 5 lbs per week to max of 30 lbs   Nitroglycerin Protocol   Apply 1/4 nitroglycerin patch to affected area daily.  Change position of patch within the affected area every 24 hours.  You may experience a headache during the first 1-2 weeks of using the patch, these should subside.  If you experience headaches after beginning nitroglycerin patch treatment, you may take your preferred over the counter pain reliever.  Another side effect of the nitroglycerin patch is skin irritation or rash related to patch adhesive.  Please notify our office if you develop more severe headaches or rash, and stop the patch.  Tendon healing with nitroglycerin patch may require 12 to 24 weeks depending on the extent of injury.  Men should not use if taking Viagra, Cialis, or Levitra.   Do not use if you have migraines or rosacea.    

## 2013-12-15 NOTE — Progress Notes (Signed)
Pre visit review using our clinic review tool, if applicable. No additional management support is needed unless otherwise documented below in the visit note. 

## 2013-12-15 NOTE — Progress Notes (Signed)
[place] [address] Phone: [phone] Fax: [phone]  Patient ID: Charles Phelps MRN: [id number], DOB: [birth year], 48 y.o. Date of Encounter: 12/15/2013  Primary Physician:  Viviana Simpler, MD   Chief Complaint: Tendonitis   Subjective:   History of Present Illness:  Charles Phelps is a 48 y.o. pleasant patient who presents with the following:  Pleasant patient who presents with a 18 month history of posterior heel pain. He briefly mentioned to me once when I was evaluating for another acute issue, but I have never seen previously. No occult, abrupt onset. Has been more insidious in character. There is a dull ache present and worse with activity:  [facility] Prior meds: nsaids Orthosis / Braces: none   Padding for back of shoe N[facility].   Patient Active Problem List   Diagnosis Date Noted  . Elevated LFTs 09/03/2011  . Gout, unspecified 09/03/2011  . Type II or unspecified type diabetes mellitus without mention of complication, not stated as uncontrolled 02/07/2011  . Routine general medical examination at a health care facility 02/01/2011  . IBS 02/27/2009  . HYPERTENSION 01/27/2009  . OSTEOARTHRITIS 02/21/2007  . HYPERLIPIDEMIA 02/17/2007  . ANXIETY 02/17/2007  . ALLERGIC RHINITIS 02/17/2007  . GERD 02/17/2007  . ARTHRITIS, LEFT HIP 02/17/2007    Past Medical History  Diagnosis Date  . Allergic rhinitis   . Anxiety   . GERD (gastroesophageal reflux disease)   . Hyperlipidemia   . Elevated LFTs 2000    fatty liver  . Osteoarthritis   . Glucose intolerance (impaired glucose tolerance)   . Obstructive sleep apnea   . Hypertension   . IBS (irritable bowel syndrome)     No past surgical history on file.  History   Social History  . Marital Status: Married    Spouse Name: N/A    Number of Children: 1  . Years of Education: N/A   Occupational History  . Industrial / Geologist, engineering       Social History Main Topics  . Smoking status: Never Smoker   . Smokeless tobacco: Never Used  . Alcohol Use: Yes     Comment: Occasionally  . Drug Use: No  . Sexual Activity: Not on file   Other Topics Concern  . Not on file   Social History Narrative  . No narrative on file    Family History  Problem Relation Age of Onset  . Hypertension Mother   . Diabetes Mother   . Heart disease Father     CABG at 70  . Cancer Father     lung  . Diabetes Sister   . Diabetes Brother     No Known Allergies  Medication list has been reviewed and updated.  Review of Systems:  GEN: No fevers, chills. Nontoxic. Primarily MSK c/o today. MSK: Detailed in the HPI GI: tolerating PO intake without difficulty Neuro: No numbness, parasthesias, or tingling associated. Otherwise the pertinent positives of the ROS are noted above.   Objective:   Physical Examination: BP 120/80  Pulse 76  Temp(Src) 98.3 F (36.8 C) (Oral)  Ht _0  (1.854 m)  Wt 303 lb 8 oz (137.667 kg)  BMI 40.05 kg/m2  Ideal Body Weight: Weight in (lb) to have BMI = 25: 189.1   GEN: Well-developed,well-nourished,in no acute distress; alert,appropriate and cooperative throughout examination HEENT: Normocephalic and atraumatic without obvious abnormalities. Ears, externally no deformities PULM: Breathing comfortably in no respiratory distress  EXT: No clubbing, cyanosis, or edema PSYCH: Normally interactive. Cooperative during the interview. Pleasant. Friendly and conversant. Not anxious or depressed appearing. Normal, full affect.  Foot: B Echymosis: no Edema: no ROM: full LE B Gait: heel toe, non-antalgic MT pain: no Callus pattern: none Lateral Mall: NT Medial Mall: NT Talus: NT Navicular: NT Cuboid: NT Calcaneous: NT Metatarsals: NT 5th MT: NT Phalanges: NT Achilles: PAINFUL TO PALPATE AT INSERTION N heel insertion moderate sized NODULE Plantar Fascia: NT Fat Pad: NT Peroneals: NT Post Tib:  NT Great Toe: Nml motion Ant Drawer: neg ATFL: NT CFL: NT Deltoid: NT Sensation: intact   Radiology: No results found.  Assessment & Plan:   Achilles tendinosis  > 1 year symptoms, anticipate slow progress. Start with classic rehab, NTG. Additional padding for work boots.  Follow-up: Return in about 2 months (around 02/14/2014). Unless noted above, the patient is to follow-up if symptoms worsen. Red flags were reviewed with the patient.  New Prescriptions   NITROGLYCERIN (NITRODUR - DOSED IN MG/24 HR) 0.2 MG/HR PATCH    Apply 1/4 of a patch to the affected area and change every 24 hours (re: tendinopathy)   Patient Instructions  Achilles Rehab  Begin with easy walking, heel, toe and backwards  Calf raises on a step First lower and then raise on 1 foot If this is painful lower on 1 foot but do the heel raise on both feet  Begin with 3 sets of 10 repetitions  Increase by 5 repetitions every 3 days  Goal is 3 sets of 30 repetitions  Do with both knee straight and knee at 20 degrees of flexion  If pain persists at 3 sets of 30 - add backpack with 5 lbs Increase by 5 lbs per week to max of 30 lbs    Nitroglycerin Protocol   Apply 1/4 nitroglycerin patch to affected area daily.  Change position of patch within the affected area every 24 hours.  You may experience a headache during the first 1-2 weeks of using the patch, these should subside.  If you experience headaches after beginning nitroglycerin patch treatment, you may take your preferred over the counter pain reliever.  Another side effect of the nitroglycerin patch is skin irritation or rash related to patch adhesive.  Please notify our office if you develop more severe headaches or rash, and stop the patch.  Tendon healing with nitroglycerin patch may require 12 to 24 weeks depending on the extent of injury.  Men should not use if taking Viagra, Cialis, or Levitra.   Do not use if you have migraines or  rosacea.       Signed,  Maud Deed. Ellis Koffler, MD, [place]   Patient's Medications  New Prescriptions   NITROGLYCERIN (NITRODUR - DOSED IN MG/24 HR) 0.2 MG/HR PATCH    Apply 1/4 of a patch to the affected area and change every 24 hours (re: tendinopathy)  Previous Medications   ASPIRIN EC 81 MG TABLET    Take 81 mg by mouth every other day.   BLOOD GLUCOSE MONITORING SUPPL (ONE TOUCH BASIC SYSTEM) W/DEVICE KIT    Use to check blood sugar once daily dx: 250.00   CETIRIZINE (ZYRTEC) 10 MG TABLET    Take 10 mg by mouth daily.     CITALOPRAM (CELEXA) 20 MG TABLET    Take 1 tablet (20 mg total) by mouth daily.   GLUCOSE BLOOD (ONE TOUCH TEST STRIPS) TEST STRIP    Use as instructed to  check blood sugar once daily dx: 250.00   HYOSCYAMINE (LEVBID) 0.375 MG 12 HR TABLET    Take 1 tablet (0.375 mg total) by mouth 2 (two) times daily as needed.   LISINOPRIL (PRINIVIL,ZESTRIL) 20 MG TABLET    Take 1 tablet (20 mg total) by mouth daily.   METFORMIN (GLUCOPHAGE) 1000 MG TABLET    Take 1 tablet (1,000 mg total) by mouth 2 (two) times daily with a meal.   MULTIPLE VITAMIN (ONE-A-DAY MENS) TABS    Take by mouth.     OMEPRAZOLE (PRILOSEC) 20 MG CAPSULE    Take 1 capsule (20 mg total) by mouth daily.   ONE TOUCH LANCETS MISC    Use to check blood sugar once daily dx: 250.00  Modified Medications   No medications on file  Discontinued Medications   No medications on file

## 2014-02-14 ENCOUNTER — Encounter: Payer: Self-pay | Admitting: Family Medicine

## 2014-02-14 ENCOUNTER — Ambulatory Visit (INDEPENDENT_AMBULATORY_CARE_PROVIDER_SITE_OTHER): Payer: BC Managed Care – PPO | Admitting: Family Medicine

## 2014-02-14 VITALS — BP 134/88 | HR 83 | Temp 98.4°F | Ht 73.0 in | Wt 302.5 lb

## 2014-02-14 DIAGNOSIS — M7660 Achilles tendinitis, unspecified leg: Secondary | ICD-10-CM

## 2014-02-14 DIAGNOSIS — M7661 Achilles tendinitis, right leg: Secondary | ICD-10-CM

## 2014-02-14 NOTE — Patient Instructions (Signed)
Increase nitroglycerine patch to 1/2 a patch daily  Continue lowers on the step  Achilles Rehab  Begin with easy walking, heel, toe and backwards  Calf raises on a step First lower and then raise on 1 foot If this is painful lower on 1 foot but do the heel raise on both feet  Begin with 3 sets of 10 repetitions  Increase by 5 repetitions every 3 days  Goal is 3 sets of 30 repetitions  Do with both knee straight and knee at 20 degrees of flexion  If pain persists at 3 sets of 30 - add backpack with 5 lbs Increase by 5 lbs per week to max of 30 lbs

## 2014-02-14 NOTE — Progress Notes (Signed)
[place] [id number] Phone: [phone] Fax: [phone]  Patient ID: Charles Phelps MRN: [id number], DOB: 1965-12-20, 48 y.o. Date of Encounter: 02/14/2014  Primary Physician:  Viviana Simpler, MD   Chief Complaint: Follow-up   Subjective:   History of Present Illness:  Charles Phelps is a 48 y.o. pleasant patient who presents with the following:  The patient is here in followup, and he is not much better. He has had some ongoing chronic Achilles tendinopathy with a very large nodule present for almost 2 years. He is actually arteries seen Dr. Doran Durand at [facility] orthopedics, and he reports being compliant, he has some superficial orthotics, he has some heel cups he is tried, and on his last office visit I gave him some poron to use on the posterior aspect of his heel.  He has a hardy discussed some surgical options with Dr. Doran Durand previously.  12/15/2013 Last OV with Owens Loffler, MD  Pleasant patient who presents with a 18 month history of posterior heel pain. He briefly mentioned to me once when I was evaluating for another acute issue, but I have never seen previously. No occult, abrupt onset. Has been more insidious in character. There is a dull ache present and worse with activity:  [facility] Prior meds: nsaids Orthosis / Braces: none   Padding for back of shoe [facility].   Patient Active Problem List   Diagnosis Date Noted  . Elevated LFTs 09/03/2011  . Gout, unspecified 09/03/2011  . Type II or unspecified type diabetes mellitus without mention of complication, not stated as uncontrolled 02/07/2011  . Routine general medical examination at a health care facility 02/01/2011  . IBS 02/27/2009  . HYPERTENSION 01/27/2009  . OSTEOARTHRITIS 02/21/2007  . HYPERLIPIDEMIA 02/17/2007  . ANXIETY 02/17/2007  . ALLERGIC RHINITIS 02/17/2007  . GERD 02/17/2007  . ARTHRITIS, LEFT HIP 02/17/2007    Past Medical  History  Diagnosis Date  . Allergic rhinitis   . Anxiety   . GERD (gastroesophageal reflux disease)   . Hyperlipidemia   . Elevated LFTs 2000    fatty liver  . Osteoarthritis   . Glucose intolerance (impaired glucose tolerance)   . Obstructive sleep apnea   . Hypertension   . IBS (irritable bowel syndrome)     No past surgical history on file.  History   Social History  . Marital Status: Married    Spouse Name: N/A    Number of Children: 1  . Years of Education: N/A   Occupational History  . Industrial / Geologist, engineering     Social History Main Topics  . Smoking status: Never Smoker   . Smokeless tobacco: Never Used  . Alcohol Use: Yes     Comment: Occasionally  . Drug Use: No  . Sexual Activity: Not on file   Other Topics Concern  . Not on file   Social History Narrative  . No narrative on file    Family History  Problem Relation Age of Onset  . Hypertension Mother   . Diabetes Mother   . Heart disease Father     CABG at 39  . Cancer Father     lung  . Diabetes Sister   . Diabetes Brother     No Known Allergies  Medication list has been reviewed and updated.  Review of Systems:  GEN: No fevers, chills. Nontoxic. Primarily MSK c/o today. MSK: Detailed in the HPI GI: tolerating PO intake  without difficulty Neuro: No numbness, parasthesias, or tingling associated. Otherwise the pertinent positives of the ROS are noted above.   Objective:   Physical Examination: BP 134/88  Pulse 83  Temp(Src) 98.4 F (36.9 C) (Oral)  Ht _0  (1.854 m)  Wt 302 lb 8 oz (137.213 kg)  BMI 39.92 kg/m2  Ideal Body Weight: Weight in (lb) to have BMI = 25: 189.1   GEN: Well-developed,well-nourished,in no acute distress; alert,appropriate and cooperative throughout examination HEENT: Normocephalic and atraumatic without obvious abnormalities. Ears, externally no deformities PULM: Breathing comfortably in no respiratory distress EXT: No clubbing, cyanosis, or  edema PSYCH: Normally interactive. Cooperative during the interview. Pleasant. Friendly and conversant. Not anxious or depressed appearing. Normal, full affect.  Foot: B Echymosis: no Edema: no ROM: full LE B Gait: heel toe, non-antalgic MT pain: no Callus pattern: none Lateral Mall: NT Medial Mall: NT Talus: NT Navicular: NT Cuboid: NT Calcaneous: NT Metatarsals: NT 5th MT: NT Phalanges: NT Achilles: PAINFUL TO PALPATE AT INSERTION N heel insertion large sized NODULE (ON THE RIGHT) Plantar Fascia: NT Fat Pad: NT Peroneals: NT Post Tib: NT Great Toe: Nml motion Ant Drawer: neg ATFL: NT CFL: NT Deltoid: NT Sensation: intact   Radiology: No results found.  Assessment & Plan:   Achilles tendinitis of right lower extremity    Approaching 2 year symptoms, anticipate slow progress. Start with classic rehab, NTG. Additional padding for work boots. This is a challenging case. I think it is reasonable to continue trying to do some conservative, nonoperative things, but ultimately he is already had symptoms for a very long time.  Increase NTG protocol  Follow-up: No Follow-up on file. Unless noted above, the patient is to follow-up if symptoms worsen. Red flags were reviewed with the patient.  New Prescriptions   No medications on file   Patient Instructions  Increase nitroglycerine patch to 1/2 a patch daily  Continue lowers on the step  Achilles Rehab  Begin with easy walking, heel, toe and backwards  Calf raises on a step First lower and then raise on 1 foot If this is painful lower on 1 foot but do the heel raise on both feet  Begin with 3 sets of 10 repetitions  Increase by 5 repetitions every 3 days  Goal is 3 sets of 30 repetitions  Do with both knee straight and knee at 20 degrees of flexion  If pain persists at 3 sets of 30 - add backpack with 5 lbs Increase by 5 lbs per week to max of 30 lbs      Signed,  Yamilee Harmes T. Bellamie Turney, MD, [place]   Patient's Medications  New Prescriptions   No medications on file  Previous Medications   ASPIRIN EC 81 MG TABLET    Take 81 mg by mouth every other day.   BLOOD GLUCOSE MONITORING SUPPL (ONE TOUCH BASIC SYSTEM) W/DEVICE KIT    Use to check blood sugar once daily dx: 250.00   CETIRIZINE (ZYRTEC) 10 MG TABLET    Take 10 mg by mouth daily.     CITALOPRAM (CELEXA) 20 MG TABLET    Take 1 tablet (20 mg total) by mouth daily.   GLUCOSE BLOOD (ONE TOUCH TEST STRIPS) TEST STRIP    Use as instructed to check blood sugar once daily dx: 250.00   HYOSCYAMINE (LEVBID) 0.375 MG 12 HR TABLET    Take 1 tablet (0.375 mg total) by mouth 2 (two) times daily as needed.  LISINOPRIL (PRINIVIL,ZESTRIL) 20 MG TABLET    Take 1 tablet (20 mg total) by mouth daily.   METFORMIN (GLUCOPHAGE) 1000 MG TABLET    Take 1 tablet (1,000 mg total) by mouth 2 (two) times daily with a meal.   MULTIPLE VITAMIN (ONE-A-DAY MENS) TABS    Take by mouth.     NITROGLYCERIN (NITRODUR - DOSED IN MG/24 HR) 0.2 MG/HR PATCH    Apply 1/4 of a patch to the affected area and change every 24 hours (re: tendinopathy)   OMEPRAZOLE (PRILOSEC) 20 MG CAPSULE    Take 1 capsule (20 mg total) by mouth daily.   ONE TOUCH LANCETS MISC    Use to check blood sugar once daily dx: 250.00  Modified Medications   No medications on file  Discontinued Medications   No medications on file

## 2014-02-14 NOTE — Progress Notes (Signed)
Pre visit review using our clinic review tool, if applicable. No additional management support is needed unless otherwise documented below in the visit note. 

## 2014-02-19 ENCOUNTER — Other Ambulatory Visit: Payer: Self-pay | Admitting: Internal Medicine

## 2014-02-23 ENCOUNTER — Encounter: Payer: Self-pay | Admitting: Internal Medicine

## 2014-02-23 ENCOUNTER — Ambulatory Visit (INDEPENDENT_AMBULATORY_CARE_PROVIDER_SITE_OTHER): Payer: BC Managed Care – PPO | Admitting: Internal Medicine

## 2014-02-23 VITALS — BP 140/80 | HR 79 | Temp 97.8°F | Wt 297.0 lb

## 2014-02-23 DIAGNOSIS — J019 Acute sinusitis, unspecified: Secondary | ICD-10-CM | POA: Insufficient documentation

## 2014-02-23 DIAGNOSIS — J018 Other acute sinusitis: Secondary | ICD-10-CM

## 2014-02-23 MED ORDER — AMOXICILLIN 500 MG PO TABS: 1000.0000 mg | ORAL_TABLET | Freq: Two times a day (BID) | ORAL | Status: DC

## 2014-02-23 NOTE — Progress Notes (Signed)
Pre visit review using our clinic review tool, if applicable. No additional management support is needed unless otherwise documented below in the visit note. 

## 2014-02-23 NOTE — Progress Notes (Signed)
Subjective:    Patient ID: Charles Phelps, male    DOB: [birth year], 48 y.o.   MRN: [id number]  HPI Sick for a couple of weeks "run down" Had to lie down yesterday--- fever to 101 Has had some headache---?from nitro  Feels congested in chest Cough--mostly dry Some rhinorrhea with PND---stomach upset Sore throat only this AM No ear pain  Tried 2 mucinex--not really helpful Using tylenol regularly Had left over amoxil-- took 2 yesterday and 2 last night  Current Outpatient Prescriptions on File Prior to Visit  Medication Sig Dispense Refill  . aspirin EC 81 MG tablet Take 81 mg by mouth every other day.      . cetirizine (ZYRTEC) 10 MG tablet Take 10 mg by mouth daily.        . citalopram (CELEXA) 20 MG tablet Take 1 tablet (20 mg total) by mouth daily.  90 tablet  3  . glucose blood (ONE TOUCH TEST STRIPS) test strip Use as instructed to check blood sugar once daily dx: 250.00  100 each  0  . hyoscyamine (LEVBID) 0.375 MG 12 hr tablet Take 1 tablet (0.375 mg total) by mouth 2 (two) times daily as needed.  180 tablet  3  . lisinopril (PRINIVIL,ZESTRIL) 20 MG tablet Take 1 tablet (20 mg total) by mouth daily.  30 tablet  1  . metFORMIN (GLUCOPHAGE) 1000 MG tablet TAKE 1 TABLET TWICE A DAY WITH MEALS  180 tablet  0  . Multiple Vitamin (ONE-A-DAY MENS) TABS Take by mouth.        . nitroGLYCERIN (NITRODUR - DOSED IN MG/24 HR) 0.2 mg/hr patch Apply 1/4 of a patch to the affected area and change every 24 hours (re: tendinopathy)  30 patch  4  . omeprazole (PRILOSEC) 20 MG capsule Take 1 capsule (20 mg total) by mouth daily.  90 capsule  3  . ONE TOUCH LANCETS MISC Use to check blood sugar once daily dx: 250.00  200 each  0   No current facility-administered medications on file prior to visit.    No Known Allergies  Past Medical History  Diagnosis Date  . Allergic rhinitis   . Anxiety   . GERD (gastroesophageal reflux disease)   . Hyperlipidemia   . Elevated LFTs 2000   fatty liver  . Osteoarthritis   . Glucose intolerance (impaired glucose tolerance)   . Obstructive sleep apnea   . Hypertension   . IBS (irritable bowel syndrome)     No past surgical history on file.  Family History  Problem Relation Age of Onset  . Hypertension Mother   . Diabetes Mother   . Heart disease Father     CABG at 57  . Cancer Father     lung  . Diabetes Sister   . Diabetes Brother     History   Social History  . Marital Status: Married    Spouse Name: N/A    Number of Children: 1  . Years of Education: N/A   Occupational History  . Industrial / Geologist, engineering     Social History Main Topics  . Smoking status: Never Smoker   . Smokeless tobacco: Never Used  . Alcohol Use: Yes     Comment: Occasionally  . Drug Use: No  . Sexual Activity: Not on file   Other Topics Concern  . Not on file   Social History Narrative  . No narrative on file   Review of Systems Has had cold sore  Did increase the metformin Weight is down a little---appetite is off Some diarrhea--with this illness    Objective:   Physical Exam  Constitutional: He appears well-developed and well-nourished. No distress.  HENT:  Mouth/Throat: Oropharynx is clear and moist. No oropharyngeal exudate.  No sinus tenderness Moderate nasal inflammation TMs normal  Neck: Normal range of motion. Neck supple. No thyromegaly present.  Pulmonary/Chest: Effort normal and breath sounds normal. No respiratory distress. He has no wheezes. He has no rales.  Lymphadenopathy:    He has no cervical adenopathy.  Skin: No rash noted.          Assessment & Plan:

## 2014-02-23 NOTE — Patient Instructions (Signed)
Please set up appointment for blood work in about 1 month (A1c--250.02)

## 2014-02-23 NOTE — Assessment & Plan Note (Signed)
Likely diagnosis Has been sick for 2 weeks and now worsened Will give amoxil Supportive care discussed

## 2014-03-09 ENCOUNTER — Ambulatory Visit (INDEPENDENT_AMBULATORY_CARE_PROVIDER_SITE_OTHER): Payer: BC Managed Care – PPO | Admitting: Family Medicine

## 2014-03-09 ENCOUNTER — Encounter: Payer: Self-pay | Admitting: Family Medicine

## 2014-03-09 VITALS — BP 126/80 | HR 81 | Temp 98.1°F | Ht 73.0 in | Wt 296.8 lb

## 2014-03-09 DIAGNOSIS — J189 Pneumonia, unspecified organism: Secondary | ICD-10-CM

## 2014-03-09 MED ORDER — DOXYCYCLINE HYCLATE 100 MG PO TABS: 100.0000 mg | ORAL_TABLET | Freq: Two times a day (BID) | ORAL | Status: DC

## 2014-03-09 NOTE — Progress Notes (Signed)
   03/09/2014    ID: Charles Phelps   MRN: [id number]  DOB: 1966/01/31  Primary Physician:  Viviana Simpler, MD  Chief Complaint: Cough  Subjective:   History of Present Illness:  Charles Phelps is a 48 y.o. very pleasant male patient who presents with the following:  Patient presents for presents evaluation of myalgias, nasal congestion, productive cough, rhinorrhea , sneezing and sore throat. Symptoms began > 1 month ago and are gradually worsening since that time.  Cough is the worst part, and he took a round of amox from Dr. Silvio Pate already.  Risk Factors:  The patient denies significant nausea, vomitting, diarrhea, rash, diffuse arthralgia or myalgia. They also deny high fever.   Past Medical History, Surgical History, Social History, Family History, Problem List, Medications, and Allergies have been reviewed and updated if relevant.  Review of Systems: ROS: GEN: Acute illness details above GI: Tolerating PO intake GU: maintaining adequate hydration and urination Pulm: No SOB Interactive and getting along well at home.  Otherwise, ROS is as per the HPI.   Objective:   Physical Examination: BP 126/80  Pulse 81  Temp(Src) 98.1 F (36.7 C) (Oral)  Ht 6\' 1"  (1.854 m)  Wt 296 lb 12 oz (134.605 kg)  BMI 39.16 kg/m2  SpO2 96%   GEN: A and O x 3. WDWN. NAD.    ENT: Nose clear, ext NML.  No LAD.  No JVD.  TM's clear. Oropharynx clear.  PULM: Normal WOB, no distress. No crackles, wheezes, rhonchi. CV: RRR, no M/G/R, No rubs, No JVD.   EXT: warm and well-perfused, No c/c/e. PSYCH: Pleasant and conversant.   Laboratory and Imaging Data: No results found.  Assessment & Plan:   Walking pneumonia  At this point, we reviewed supportive care. Given the length of time and risk factors, treat with medications below.  New Prescriptions   DOXYCYCLINE (VIBRA-TABS) 100 MG TABLET    Take 1 tablet (100 mg total) by mouth 2 (two) times daily.   Modified Medications   No medications on file    Signed,  Maud Deed. Veatrice Eckstein, MD, [facility] [place], [address] Phone: [phone] Fax: [phone]  Discontinued Medications   AMOXICILLIN (AMOXIL) 500 MG TABLET    Take 2 tablets (1,000 mg total) by mouth 2 (two) times daily.

## 2014-03-09 NOTE — Progress Notes (Signed)
Pre visit review using our clinic review tool, if applicable. No additional management support is needed unless otherwise documented below in the visit note. 

## 2014-03-18 ENCOUNTER — Ambulatory Visit (INDEPENDENT_AMBULATORY_CARE_PROVIDER_SITE_OTHER)
Admission: RE | Admit: 2014-03-18 | Discharge: 2014-03-18 | Disposition: A | Discharge status: Home / Self Care | Payer: BC Managed Care – PPO | Source: Ambulatory Visit | Attending: Internal Medicine | Admitting: Internal Medicine

## 2014-03-18 ENCOUNTER — Encounter: Payer: Self-pay | Admitting: Internal Medicine

## 2014-03-18 ENCOUNTER — Ambulatory Visit (INDEPENDENT_AMBULATORY_CARE_PROVIDER_SITE_OTHER): Payer: BC Managed Care – PPO | Admitting: Internal Medicine

## 2014-03-18 VITALS — BP 148/88 | HR 78 | Temp 97.9°F | Wt 301.0 lb

## 2014-03-18 DIAGNOSIS — R05 Cough: Secondary | ICD-10-CM

## 2014-03-18 DIAGNOSIS — R062 Wheezing: Secondary | ICD-10-CM

## 2014-03-18 DIAGNOSIS — R059 Cough, unspecified: Secondary | ICD-10-CM

## 2014-03-18 MED ORDER — HYDROCODONE-HOMATROPINE 5-1.5 MG/5ML PO SYRP: 5.0000 mL | ORAL_SOLUTION | Freq: Three times a day (TID) | ORAL | Status: DC | PRN

## 2014-03-18 MED ORDER — ALBUTEROL SULFATE HFA 108 (90 BASE) MCG/ACT IN AERS: 2.0000 | INHALATION_SPRAY | Freq: Four times a day (QID) | RESPIRATORY_TRACT | Status: DC | PRN

## 2014-03-18 MED ORDER — METHYLPREDNISOLONE ACETATE 80 MG/ML IJ SUSP
80.0000 mg | Freq: Once | INTRAMUSCULAR | Status: AC
  Administered 2014-03-18: 80 mg via INTRAMUSCULAR

## 2014-03-18 NOTE — Patient Instructions (Addendum)
Bronchospasm A bronchospasm is when the tubes that carry air in and out of your lungs (airways) spasm or tighten. During a bronchospasm it is hard to breathe. This is because the airways get smaller. A bronchospasm can be triggered by:  Allergies. These may be to animals, pollen, food, or mold.  Infection. This is a common cause of bronchospasm.  Exercise.  Irritants. These include pollution, cigarette smoke, strong odors, aerosol sprays, and paint fumes.  Weather changes.  Stress.  Being emotional. HOME CARE   Always have a plan for getting help. Know when to call your doctor and local emergency services (911 in the [place] Know where you can get emergency care.  Only take medicines as told by your doctor.  If you were prescribed an inhaler or nebulizer machine, ask your doctor how to use it correctly. Always use a spacer with your inhaler if you were given one.  Stay calm during an attack. Try to relax and breathe more slowly.  Control your home environment:  Change your heating and air conditioning filter at least once a month.  Limit your use of fireplaces and wood stoves.  Do not  smoke. Do not  allow smoking in your home.  Avoid perfumes and fragrances.  Get rid of pests (such as roaches and mice) and their droppings.  Throw away plants if you see mold on them.  Keep your house clean and dust free.  Replace carpet with wood, tile, or vinyl flooring. Carpet can trap dander and dust.  Use allergy-proof pillows, mattress covers, and box spring covers.  Wash bed sheets and blankets every week in hot water. Dry them in a dryer.  Use blankets that are made of polyester or cotton.  Wash hands frequently. GET HELP IF:  You have muscle aches.  You have chest pain.  The thick spit you spit or cough up (sputum) changes from clear or white to yellow, green, gray, or bloody.  The thick spit you spit or cough up gets thicker.  There are problems that may be related  to the medicine you are given such as:  A rash.  Itching.  Swelling.  Trouble breathing. GET HELP RIGHT AWAY IF:  You feel you cannot breathe or catch your breath.  You cannot stop coughing.  Your treatment is not helping you breathe better.  You have very bad chest pain. MAKE SURE YOU:   Understand these instructions.  Will watch your condition.  Will get help right away if you are not doing well or get worse. Document Released: 05/12/2009 Document Revised: 07/20/2013 Document Reviewed: 01/05/2013 [facility] Patient Information 2015 [place], [place]. This information is not intended to replace advice given to you by your health care provider. Make sure you discuss any questions you have with your health care provider. Cough, Adult  A cough is a reflex that helps clear your throat and airways. It can help heal the body or may be a reaction to an irritated airway. A cough may only last 2 or 3 weeks (acute) or may last more than 8 weeks (chronic).  CAUSES Acute cough:  Viral or bacterial infections. Chronic cough:  Infections.  Allergies.  Asthma.  Post-nasal drip.  Smoking.  Heartburn or acid reflux.  Some medicines.  Chronic lung problems (COPD).  Cancer. SYMPTOMS   Cough.  Fever.  Chest pain.  Increased breathing rate.  High-pitched whistling sound when breathing (wheezing).  Colored mucus that you cough up (sputum). TREATMENT   A bacterial cough may  be treated with antibiotic medicine.  A viral cough must run its course and will not respond to antibiotics.  Your caregiver may recommend other treatments if you have a chronic cough. HOME CARE INSTRUCTIONS   Only take over-the-counter or prescription medicines for pain, discomfort, or fever as directed by your caregiver. Use cough suppressants only as directed by your caregiver.  Use a cold steam vaporizer or humidifier in your bedroom or home to help loosen secretions.  Sleep in a  semi-upright position if your cough is worse at night.  Rest as needed.  Stop smoking if you smoke. SEEK IMMEDIATE MEDICAL CARE IF:   You have pus in your sputum.  Your cough starts to worsen.  You cannot control your cough with suppressants and are losing sleep.  You begin coughing up blood.  You have difficulty breathing.  You develop pain which is getting worse or is uncontrolled with medicine.  You have a fever. MAKE SURE YOU:   Understand these instructions.  Will watch your condition.  Will get help right away if you are not doing well or get worse. Document Released: 01/11/2011 Document Revised: 10/07/2011 Document Reviewed: 01/11/2011 [facility] Patient Information 2015 [place], [place]. This information is not intended to replace advice given to you by your health care provider. Make sure you discuss any questions you have with your health care provider.

## 2014-03-18 NOTE — Progress Notes (Signed)
Subjective:    Patient ID: Charles Phelps, male    DOB: 1966-02-09, 48 y.o.   MRN: [id number]  HPI  Pt presents to the clinic today with c/o wheezing. He reports this has been going on for more than a month. He was treated for a sinus infection 02/23/14 with 10 days of Augmentin. He then developed worsening cough and chest congestion. He was treated on 03/09/14 for walking pneumonia with a 10 day course of Doxycycline. Since that time, he reports he has started wheezing and coughing again. The cough is unproductive. He does have a history of seasonal allergies but denies breathing problems. He has not had sick contacts that he is aware of.   Review of Systems  Past Medical History  Diagnosis Date  . Allergic rhinitis   . Anxiety   . GERD (gastroesophageal reflux disease)   . Hyperlipidemia   . Elevated LFTs 2000    fatty liver  . Osteoarthritis   . Glucose intolerance (impaired glucose tolerance)   . Obstructive sleep apnea   . Hypertension   . IBS (irritable bowel syndrome)     Current Outpatient Prescriptions  Medication Sig Dispense Refill  . aspirin EC 81 MG tablet Take 81 mg by mouth every other day.      . cetirizine (ZYRTEC) 10 MG tablet Take 10 mg by mouth daily.        . citalopram (CELEXA) 20 MG tablet Take 1 tablet (20 mg total) by mouth daily.  90 tablet  3  . doxycycline (VIBRA-TABS) 100 MG tablet Take 1 tablet (100 mg total) by mouth 2 (two) times daily.  20 tablet  0  . glucose blood (ONE TOUCH TEST STRIPS) test strip Use as instructed to check blood sugar once daily dx: 250.00  100 each  0  . hyoscyamine (LEVBID) 0.375 MG 12 hr tablet Take 1 tablet (0.375 mg total) by mouth 2 (two) times daily as needed.  180 tablet  3  . lisinopril (PRINIVIL,ZESTRIL) 20 MG tablet Take 1 tablet (20 mg total) by mouth daily.  30 tablet  1  . metFORMIN (GLUCOPHAGE) 1000 MG tablet TAKE 1 TABLET TWICE A DAY WITH MEALS  180 tablet  0  . Multiple Vitamin (ONE-A-DAY MENS) TABS Take by  mouth.        . nitroGLYCERIN (NITRODUR - DOSED IN MG/24 HR) 0.2 mg/hr patch Apply 1/4 of a patch to the affected area and change every 24 hours (re: tendinopathy)  30 patch  4  . omeprazole (PRILOSEC) 20 MG capsule Take 1 capsule (20 mg total) by mouth daily.  90 capsule  3  . ONE TOUCH LANCETS MISC Use to check blood sugar once daily dx: 250.00  200 each  0   No current facility-administered medications for this visit.    No Known Allergies  Family History  Problem Relation Age of Onset  . Hypertension Mother   . Diabetes Mother   . Heart disease Father     CABG at 35  . Cancer Father     lung  . Diabetes Sister   . Diabetes Brother     History   Social History  . Marital Status: Married    Spouse Name: N/A    Number of Children: 1  . Years of Education: N/A   Occupational History  . Industrial / Geologist, engineering     Social History Main Topics  . Smoking status: Never Smoker   . Smokeless tobacco: Never Used  .  Alcohol Use: Yes     Comment: Occasionally  . Drug Use: No  . Sexual Activity: Not on file   Other Topics Concern  . Not on file   Social History Narrative  . No narrative on file     Constitutional: Denies fever, malaise, fatigue, headache or abrupt weight changes.  HEENT: Denies eye pain, eye redness, ear pain, ringing in the ears, wax buildup, runny nose, nasal congestion, bloody nose, or sore throat. Respiratory: Pt reports wheezing and cough. Denies difficulty breathing, shortness of breath, or sputum production.   Cardiovascular: Denies chest pain, chest tightness, palpitations or swelling in the hands or feet.    No other specific complaints in a complete review of systems (except as listed in HPI above).     Objective:   Physical Exam  BP 148/88  Pulse 78  Temp(Src) 97.9 F (36.6 C) (Oral)  Wt 301 lb (136.533 kg)  SpO2 98% Wt Readings from Last 3 Encounters:  03/18/14 301 lb (136.533 kg)  03/09/14 296 lb 12 oz (134.605 kg)    02/23/14 297 lb (134.718 kg)    General: Appears his stated age, well developed, well nourished in NAD.Marland Kitchen HEENT:  Ears: Tm's gray and intact, normal light reflex; Nose: mucosa pink and moist, septum midline; Throat/Mouth: Teeth present, mucosa pink and moist, no exudate, lesions or ulcerations noted.  . Cardiovascular: Normal rate and rhythm. S1,S2 noted.  No murmur, rubs or gallops noted. No JVD or BLE edema. No carotid bruits noted. Pulmonary/Chest: Normal effort and positive vesicular breath sounds. No respiratory distress. Intermittent expiratory wheezed noted. No rales or ronchi noted.    BMET    Component Value Date/Time   NA 137 06/14/2013 1234   K 3.8 06/14/2013 1234   CL 100 06/14/2013 1234   CO2 30 06/14/2013 1234   GLUCOSE 125* 06/14/2013 1234   BUN 16 06/14/2013 1234   CREATININE 1.0 06/14/2013 1234   CALCIUM 9.4 06/14/2013 1234   GFRNONAA 69.44 10/27/2009 0852   GFRAA 85 02/23/2007 1151    Lipid Panel     Component Value Date/Time   CHOL 115 08/12/2013 0832   TRIG 95.0 08/12/2013 0832   HDL 37.80* 08/12/2013 0832   CHOLHDL 3 08/12/2013 0832   VLDL 19.0 08/12/2013 0832   LDLCALC 58 08/12/2013 0832    CBC    Component Value Date/Time   WBC 8.0 06/14/2013 1234   RBC 5.06 06/14/2013 1234   HGB 14.6 06/14/2013 1234   HCT 42.5 06/14/2013 1234   PLT 224.0 06/14/2013 1234   MCV 84.2 06/14/2013 1234   MCHC 34.4 06/14/2013 1234   RDW 14.0 06/14/2013 1234   LYMPHSABS 1.8 06/14/2013 1234   MONOABS 0.6 06/14/2013 1234   EOSABS 0.1 06/14/2013 1234   BASOSABS 0.1 06/14/2013 1234    Hgb A1C Lab Results  Component Value Date   HGBA1C 8.6* 12/13/2013         Assessment & Plan:   Cough and wheezing x 1 month:  Will check chest xray 80 mg Depo IM today for wheezing Finish out your doxycyline Will give eRx for albuterol inhaler Rx given for Hycodan cough syrup  Will let you know what xray shows. If worse over the weekend, ER

## 2014-03-18 NOTE — Addendum Note (Signed)
Addended by: Lurlean Nanny on: 03/18/2014 04:18 PM   Modules accepted: Orders

## 2014-03-18 NOTE — Progress Notes (Signed)
Pre visit review using our clinic review tool, if applicable. No additional management support is needed unless otherwise documented below in the visit note. 

## 2014-03-21 ENCOUNTER — Encounter: Payer: Self-pay | Admitting: Internal Medicine

## 2014-03-23 ENCOUNTER — Other Ambulatory Visit: Payer: Self-pay | Admitting: Internal Medicine

## 2014-03-23 DIAGNOSIS — IMO0001 Reserved for inherently not codable concepts without codable children: Secondary | ICD-10-CM

## 2014-03-23 DIAGNOSIS — E1165 Type 2 diabetes mellitus with hyperglycemia: Principal | ICD-10-CM

## 2014-03-28 ENCOUNTER — Other Ambulatory Visit (INDEPENDENT_AMBULATORY_CARE_PROVIDER_SITE_OTHER): Payer: BC Managed Care – PPO

## 2014-03-28 DIAGNOSIS — E1165 Type 2 diabetes mellitus with hyperglycemia: Principal | ICD-10-CM

## 2014-03-28 DIAGNOSIS — IMO0001 Reserved for inherently not codable concepts without codable children: Secondary | ICD-10-CM

## 2014-03-28 LAB — HEMOGLOBIN A1C: Hgb A1c MFr Bld: 8.1 % — ABNORMAL HIGH (ref 4.6–6.5)

## 2014-04-11 ENCOUNTER — Ambulatory Visit (INDEPENDENT_AMBULATORY_CARE_PROVIDER_SITE_OTHER): Payer: BC Managed Care – PPO | Admitting: Family Medicine

## 2014-04-11 ENCOUNTER — Other Ambulatory Visit: Payer: Self-pay | Admitting: *Deleted

## 2014-04-11 ENCOUNTER — Encounter: Payer: Self-pay | Admitting: Family Medicine

## 2014-04-11 VITALS — BP 130/82 | HR 85 | Temp 98.4°F | Ht 73.0 in | Wt 299.8 lb

## 2014-04-11 DIAGNOSIS — M7660 Achilles tendinitis, unspecified leg: Secondary | ICD-10-CM

## 2014-04-11 DIAGNOSIS — M7661 Achilles tendinitis, right leg: Secondary | ICD-10-CM

## 2014-04-11 DIAGNOSIS — J189 Pneumonia, unspecified organism: Secondary | ICD-10-CM

## 2014-04-11 MED ORDER — LEVOFLOXACIN 500 MG PO TABS: 500.0000 mg | ORAL_TABLET | Freq: Every day | ORAL | Status: DC

## 2014-04-11 NOTE — Progress Notes (Signed)
Dr. Frederico Hamman T. Emerald Gehres, MD, [facility] [place], [address] Phone: [phone] Fax: [phone]  04/11/2014  Patient: Charles Phelps, MRN: [id number], DOB: Apr 10, 1966, 48 y.o.  Primary Physician:  Viviana Simpler, MD  Chief Complaint: Cough, Wheezing and Fatigue  Subjective:   Charles Phelps is a 48 y.o. very pleasant male patient who presents with the following:  Coughed all weekend. Wife wanted him to come back. Patient was first seen 02/23/2014, and he has now gone through a round of Amox, a round of doxycycline, and he was also given 1 shot of Depo-medrol 80 mg, and he is still having problems with persistent cough and extensive pulmonary involvement.   R heel. Chronic achilles tendinopathy - this is doing a lot better now. Still symptoms, but working on it, and he thinks that using a full NTG patch has been helping a lot.   Past Medical History, Surgical History, Social History, Family History, Problem List, Medications, and Allergies have been reviewed and updated if relevant.  ROS: GEN: Acute illness details above GI: Tolerating PO intake GU: maintaining adequate hydration and urination Pulm: No SOB Interactive and getting along well at home.  Otherwise, ROS is as per the HPI.   Objective:   BP 130/82  Pulse 85  Temp(Src) 98.4 F (36.9 C) (Oral)  Ht 6\' 1"  (1.854 m)  Wt 299 lb 12 oz (135.966 kg)  BMI 39.56 kg/m2  SpO2 96%   GEN: A and O x 3. WDWN. NAD.    ENT: Nose clear, ext NML.  No LAD.  No JVD.  TM's clear. Oropharynx clear.  PULM: Normal WOB, no distress. No crackles, wheezes, scattered rhonchi. CV: RRR, no M/G/R, No rubs, No JVD.   EXT: warm and well-perfused, No c/c/e. PSYCH: Pleasant and conversant.    Laboratory and Imaging Data: Dg Chest 2 View  03/18/2014   CLINICAL DATA:  Cough, wheezing.  EXAM: CHEST  2 VIEW  COMPARISON:  None.  FINDINGS: The heart size and mediastinal contours  are within normal limits. Right lung is clear. Small dense nodular density is seen laterally in the left upper lobe which may represent calcified granuloma. No pneumothorax or pleural effusion is noted. The visualized skeletal structures are unremarkable.  IMPRESSION: Dense nodular density seen laterally in the left upper lobe which may represent calcified granuloma; followup radiograph in 3 months is recommended to ensure stability and rule out neoplasm. No other abnormality is seen in the chest.   Electronically Signed   By: Sabino Dick M.D.   On: 03/18/2014 16:27    Assessment and Plan:   Walking pneumonia  Achilles tendinitis of right lower extremity  > 6 weeks. I wonder if he had pertussis, which could explain length of symptoms.  Abnormal chest x-ray. He has a FH of dad with Lung ca, no smoking history, and he has a physical in 3 months with Dr. Silvio Pate, so I will cc: him on this note.   F/u CXR in 3 months.  I am going to place on LVQ which should give broad PNA coverage + pertussis coverage. (May or may not help at this point)  Tendinopathy is improving. He has one of the largest nodules I have seen, but symptomatically feeling better.   Follow-up: No Follow-up on file.  New Prescriptions   LEVOFLOXACIN (LEVAQUIN) 500 MG TABLET    Take 1 tablet (500 mg total) by mouth daily.   No orders  of the defined types were placed in this encounter.    Signed,  Maud Deed. Advik Weatherspoon, MD   Patient's Medications  New Prescriptions   LEVOFLOXACIN (LEVAQUIN) 500 MG TABLET    Take 1 tablet (500 mg total) by mouth daily.  Previous Medications   ALBUTEROL (PROVENTIL HFA;VENTOLIN HFA) 108 (90 BASE) MCG/ACT INHALER    Inhale 2 puffs into the lungs every 6 (six) hours as needed for wheezing or shortness of breath.   ASPIRIN EC 81 MG TABLET    Take 81 mg by mouth every other day.   CETIRIZINE (ZYRTEC) 10 MG TABLET    Take 10 mg by mouth daily.     CITALOPRAM (CELEXA) 20 MG TABLET    Take 1 tablet  (20 mg total) by mouth daily.   GLUCOSE BLOOD (ONE TOUCH TEST STRIPS) TEST STRIP    Use as instructed to check blood sugar once daily dx: 250.00   HYOSCYAMINE (LEVBID) 0.375 MG 12 HR TABLET    Take 1 tablet (0.375 mg total) by mouth 2 (two) times daily as needed.   LISINOPRIL (PRINIVIL,ZESTRIL) 20 MG TABLET    Take 1 tablet (20 mg total) by mouth daily.   METFORMIN (GLUCOPHAGE) 1000 MG TABLET    TAKE 1 TABLET TWICE A DAY WITH MEALS   MULTIPLE VITAMIN (ONE-A-DAY MENS) TABS    Take by mouth.     NITROGLYCERIN (NITRODUR - DOSED IN MG/24 HR) 0.2 MG/HR PATCH    Apply 1/4 of a patch to the affected area and change every 24 hours (re: tendinopathy)   OMEPRAZOLE (PRILOSEC) 20 MG CAPSULE    Take 1 capsule (20 mg total) by mouth daily.   ONE TOUCH LANCETS MISC    Use to check blood sugar once daily dx: 250.00  Modified Medications   No medications on file  Discontinued Medications   DOXYCYCLINE (VIBRA-TABS) 100 MG TABLET    Take 1 tablet (100 mg total) by mouth 2 (two) times daily.   HYDROCODONE-HOMATROPINE (HYCODAN) 5-1.5 MG/5ML SYRUP    Take 5 mLs by mouth every 8 (eight) hours as needed for cough.

## 2014-04-11 NOTE — Progress Notes (Signed)
Pre visit review using our clinic review tool, if applicable. No additional management support is needed unless otherwise documented below in the visit note. 

## 2014-04-18 ENCOUNTER — Ambulatory Visit: Payer: BC Managed Care – PPO | Admitting: Family Medicine

## 2014-04-20 ENCOUNTER — Ambulatory Visit: Payer: BC Managed Care – PPO | Admitting: Family Medicine

## 2014-04-27 ENCOUNTER — Encounter: Payer: Self-pay | Admitting: Internal Medicine

## 2014-04-28 ENCOUNTER — Telehealth: Payer: Self-pay | Admitting: Internal Medicine

## 2014-04-28 NOTE — Telephone Encounter (Signed)
Please fill out the information then I can sign

## 2014-04-28 NOTE — Telephone Encounter (Signed)
Pt dropped off health screening form for Dr Silvio Pate to fill out. Please call pt when form is ready to be picked up. Form placed on Dee's desk to give to Dr Silvio Pate. Thank you!

## 2014-04-29 NOTE — Telephone Encounter (Signed)
Last Physical with complete blood work was on 05/2013, is it okay to use this info or is it to late, please advise

## 2014-04-29 NOTE — Telephone Encounter (Signed)
You can use vitals from last month and the most recent labs of any they are requesting. He has physical coming up again in DEcember--no need to wait till then to do form

## 2014-05-02 NOTE — Telephone Encounter (Signed)
Form signed No charge 

## 2014-05-02 NOTE — Telephone Encounter (Signed)
Pt had labs in Jan 2015 (used those recent labs), form in your inbox

## 2014-05-02 NOTE — Telephone Encounter (Signed)
Pt notified forms are ready for pick up and will pick up 05/03/14

## 2014-05-20 ENCOUNTER — Other Ambulatory Visit: Payer: Self-pay | Admitting: Internal Medicine

## 2014-06-18 ENCOUNTER — Other Ambulatory Visit: Payer: Self-pay | Admitting: Internal Medicine

## 2014-07-12 ENCOUNTER — Encounter: Payer: Self-pay | Admitting: Internal Medicine

## 2014-07-12 ENCOUNTER — Ambulatory Visit (INDEPENDENT_AMBULATORY_CARE_PROVIDER_SITE_OTHER): Payer: BC Managed Care – PPO | Admitting: Internal Medicine

## 2014-07-12 ENCOUNTER — Other Ambulatory Visit: Payer: Self-pay | Admitting: Internal Medicine

## 2014-07-12 ENCOUNTER — Ambulatory Visit (INDEPENDENT_AMBULATORY_CARE_PROVIDER_SITE_OTHER)
Admission: RE | Admit: 2014-07-12 | Discharge: 2014-07-12 | Disposition: A | Discharge status: Home / Self Care | Payer: BC Managed Care – PPO | Source: Ambulatory Visit | Attending: Internal Medicine | Admitting: Internal Medicine

## 2014-07-12 VITALS — BP 140/90 | HR 79 | Temp 98.6°F | Ht 73.0 in | Wt 299.0 lb

## 2014-07-12 DIAGNOSIS — R911 Solitary pulmonary nodule: Secondary | ICD-10-CM

## 2014-07-12 DIAGNOSIS — J0100 Acute maxillary sinusitis, unspecified: Secondary | ICD-10-CM

## 2014-07-12 DIAGNOSIS — E1165 Type 2 diabetes mellitus with hyperglycemia: Secondary | ICD-10-CM

## 2014-07-12 DIAGNOSIS — Z0000 Encounter for general adult medical examination without abnormal findings: Secondary | ICD-10-CM

## 2014-07-12 DIAGNOSIS — R918 Other nonspecific abnormal finding of lung field: Secondary | ICD-10-CM

## 2014-07-12 DIAGNOSIS — Z125 Encounter for screening for malignant neoplasm of prostate: Secondary | ICD-10-CM

## 2014-07-12 DIAGNOSIS — E785 Hyperlipidemia, unspecified: Secondary | ICD-10-CM

## 2014-07-12 DIAGNOSIS — I10 Essential (primary) hypertension: Secondary | ICD-10-CM

## 2014-07-12 DIAGNOSIS — IMO0001 Reserved for inherently not codable concepts without codable children: Secondary | ICD-10-CM

## 2014-07-12 DIAGNOSIS — Z1211 Encounter for screening for malignant neoplasm of colon: Secondary | ICD-10-CM

## 2014-07-12 LAB — COMPREHENSIVE METABOLIC PANEL
ALK PHOS: 90 U/L (ref 39–117)
ALT: 80 U/L — ABNORMAL HIGH (ref 0–53)
AST: 78 U/L — AB (ref 0–37)
Albumin: 4.3 g/dL (ref 3.5–5.2)
BILIRUBIN TOTAL: 0.9 mg/dL (ref 0.2–1.2)
BUN: 15 mg/dL (ref 6–23)
CO2: 27 mEq/L (ref 19–32)
Calcium: 9.4 mg/dL (ref 8.4–10.5)
Chloride: 101 mEq/L (ref 96–112)
Creatinine, Ser: 1.1 mg/dL (ref 0.4–1.5)
GFR: 79.41 mL/min (ref >60.00)
Glucose, Bld: 184 mg/dL — ABNORMAL HIGH (ref 70–99)
Potassium: 3.9 mEq/L (ref 3.5–5.1)
SODIUM: 136 meq/L (ref 135–145)
Total Protein: 7.1 g/dL (ref 6.0–8.3)

## 2014-07-12 LAB — T4, FREE: FREE T4: 0.98 ng/dL (ref 0.60–1.60)

## 2014-07-12 LAB — CBC WITH DIFFERENTIAL/PLATELET
Basophils Absolute: 0.1 10*3/uL (ref 0.0–0.1)
Basophils Relative: 0.7 % (ref 0.0–3.0)
EOS PCT: 1.8 % (ref 0.0–5.0)
Eosinophils Absolute: 0.1 10*3/uL (ref 0.0–0.7)
HEMATOCRIT: 42.6 % (ref 39.0–52.0)
Hemoglobin: 14.4 g/dL (ref 13.0–17.0)
LYMPHS ABS: 1.6 10*3/uL (ref 0.7–4.0)
Lymphocytes Relative: 20.4 % (ref 12.0–46.0)
MCHC: 33.8 g/dL (ref 30.0–36.0)
MCV: 85.3 fl (ref 78.0–100.0)
MONO ABS: 0.6 10*3/uL (ref 0.1–1.0)
Monocytes Relative: 7.9 % (ref 3.0–12.0)
NEUTROS ABS: 5.4 10*3/uL (ref 1.4–7.7)
Neutrophils Relative %: 69.2 % (ref 43.0–77.0)
Platelets: 206 10*3/uL (ref 150.0–400.0)
RBC: 4.99 Mil/uL (ref 4.22–5.81)
RDW: 14.5 % (ref 11.5–15.5)
WBC: 7.8 10*3/uL (ref 4.0–10.5)

## 2014-07-12 LAB — LIPID PANEL
CHOL/HDL RATIO: 6
Cholesterol: 218 mg/dL — ABNORMAL HIGH (ref 0–200)
HDL: 33.8 mg/dL — AB (ref >39.00)
NONHDL: 184.2
Triglycerides: 204 mg/dL — ABNORMAL HIGH (ref 0.0–149.0)
VLDL: 40.8 mg/dL — ABNORMAL HIGH (ref 0.0–40.0)

## 2014-07-12 LAB — PSA: PSA: 0.88 ng/mL (ref 0.10–4.00)

## 2014-07-12 LAB — LDL CHOLESTEROL, DIRECT: Direct LDL: 147.8 mg/dL

## 2014-07-12 LAB — HM DIABETES FOOT EXAM

## 2014-07-12 LAB — HEMOGLOBIN A1C: Hgb A1c MFr Bld: 7.4 % — ABNORMAL HIGH (ref 4.6–6.5)

## 2014-07-12 MED ORDER — AMOXICILLIN 500 MG PO TABS: 1000.0000 mg | ORAL_TABLET | Freq: Two times a day (BID) | ORAL | Status: DC

## 2014-07-12 NOTE — Progress Notes (Signed)
Subjective:    Patient ID: Charles Phelps, male    DOB: 10-Jun-1966, 48 y.o.   MRN: [id number]  HPI Here for physical but fighting sinus infection  Sinus symptoms for a week Nasal congestion, drainage of purulent material Cough No fever No SOB--except for nasal symptoms  Trying to keep to a better lifestyle Ongoing Achilles tendonitis---limits his walking Weight is stable Using nitro patch  Checks sugars every other day or so Not sure of his numbers No hypoglycemic reactions of any severity--will have mild reactions if he misses meals Last eye exam in January --due soon No sores in feet or numbness  Stopped the atorvastatin due to memory problems Still not back to what he considers normal  Current Outpatient Prescriptions on File Prior to Visit  Medication Sig Dispense Refill  . aspirin EC 81 MG tablet Take 81 mg by mouth every other day.    . cetirizine (ZYRTEC) 10 MG tablet Take 10 mg by mouth daily.      . citalopram (CELEXA) 20 MG tablet TAKE 1 TABLET DAILY 90 tablet 0  . glucose blood (ONE TOUCH TEST STRIPS) test strip Use as instructed to check blood sugar once daily dx: 250.00 100 each 0  . hyoscyamine (LEVBID) 0.375 MG 12 hr tablet TAKE 1 TABLET TWICE A DAY AS NEEDED 180 tablet 0  . lisinopril (PRINIVIL,ZESTRIL) 20 MG tablet TAKE 1 TABLET DAILY 90 tablet 0  . metFORMIN (GLUCOPHAGE) 1000 MG tablet TAKE 1 TABLET TWICE A DAY WITH MEALS 180 tablet 0  . Multiple Vitamin (ONE-A-DAY MENS) TABS Take by mouth.      . nitroGLYCERIN (NITRODUR - DOSED IN MG/24 HR) 0.2 mg/hr patch Apply 1/4 of a patch to the affected area and change every 24 hours (re: tendinopathy) 30 patch 4  . omeprazole (PRILOSEC) 20 MG capsule TAKE 1 CAPSULE DAILY 90 capsule 0  . ONE TOUCH LANCETS MISC Use to check blood sugar once daily dx: 250.00 200 each 0   No current facility-administered medications on file prior to visit.    No Known Allergies  Past Medical History  Diagnosis Date  .  Allergic rhinitis   . Anxiety   . GERD (gastroesophageal reflux disease)   . Hyperlipidemia   . Elevated LFTs 2000    fatty liver  . Osteoarthritis   . Glucose intolerance (impaired glucose tolerance)   . Obstructive sleep apnea   . Hypertension   . IBS (irritable bowel syndrome)     No past surgical history on file.  Family History  Problem Relation Age of Onset  . Hypertension Mother   . Diabetes Mother   . Heart disease Father     CABG at 64  . Cancer Father     lung  . Diabetes Sister   . Diabetes Brother     History   Social History  . Marital Status: Married    Spouse Name: N/A    Number of Children: 1  . Years of Education: N/A   Occupational History  . Industrial / Geologist, engineering     Social History Main Topics  . Smoking status: Never Smoker   . Smokeless tobacco: Current User  . Alcohol Use: Yes     Comment: Occasionally  . Drug Use: No  . Sexual Activity: Not on file   Other Topics Concern  . Not on file   Social History Narrative   Review of Systems  Constitutional: Negative for fatigue and unexpected weight change.  Wears seat belt  HENT: Negative for dental problem, hearing loss and tinnitus.        Regular with dentist  Eyes: Negative for visual disturbance.  Respiratory: Positive for cough and wheezing. Negative for chest tightness and shortness of breath.   Gastrointestinal: Positive for diarrhea. Negative for nausea, vomiting, abdominal pain and blood in stool.       Some IBS symptoms  Endocrine: Negative for polydipsia and polyuria.  Genitourinary: Negative for urgency, frequency and difficulty urinating.       No sexual problems  Musculoskeletal: Positive for arthralgias. Negative for back pain and joint swelling.       Feet hurt and occasionally his knee  Skin: Negative for rash.       No suspicious lesions  Allergic/Immunologic: Positive for environmental allergies. Negative for immunocompromised state.       Cetirizine  helps in season  Neurological: Negative for dizziness, syncope, weakness, light-headedness, numbness and headaches.  Hematological: Negative for adenopathy. Does not bruise/bleed easily.  Psychiatric/Behavioral: Negative for sleep disturbance and dysphoric mood. The patient is not nervous/anxious.        Objective:   Physical Exam  Constitutional: He is oriented to person, place, and time. He appears well-developed and well-nourished. No distress.  HENT:  Head: Normocephalic and atraumatic.  Right Ear: External ear normal.  Left Ear: External ear normal.  Mouth/Throat: Oropharynx is clear and moist. No oropharyngeal exudate.  Nasal congestion  Eyes: Conjunctivae and EOM are normal. Pupils are equal, round, and reactive to light.  Neck: Normal range of motion. Neck supple. No thyromegaly present.  Cardiovascular: Normal rate, regular rhythm, normal heart sounds and intact distal pulses.  Exam reveals no gallop.   No murmur heard. Pulmonary/Chest: Effort normal and breath sounds normal. No respiratory distress. He has no wheezes. He has no rales.  Abdominal: Soft. There is no tenderness.  Musculoskeletal: He exhibits no edema or tenderness.  Lymphadenopathy:    He has no cervical adenopathy.  Neurological: He is alert and oriented to person, place, and time.  Skin: No rash noted. No erythema.  No foot lesions Multiple benign nevi  Psychiatric: He has a normal mood and affect. His behavior is normal.          Assessment & Plan:

## 2014-07-12 NOTE — Progress Notes (Signed)
Pre visit review using our clinic review tool, if applicable. No additional management support is needed unless otherwise documented below in the visit note. 

## 2014-07-12 NOTE — Assessment & Plan Note (Signed)
Will Rx with amoxil

## 2014-07-12 NOTE — Assessment & Plan Note (Signed)
BP Readings from Last 3 Encounters:  07/12/14 140/90  04/11/14 130/82  03/18/14 148/88   Reasonable control

## 2014-07-12 NOTE — Assessment & Plan Note (Signed)
No meds due to cognitive problems with atorvastatin Will reconsider in future

## 2014-07-12 NOTE — Assessment & Plan Note (Signed)
If much worse, will add glipizide ER

## 2014-07-12 NOTE — Assessment & Plan Note (Signed)
Needs to continue on fitness Will check PSA after discussion Prefers fecal immunoassay Prefers no flu shot Will recheck CXR for nodule

## 2014-07-20 ENCOUNTER — Ambulatory Visit (INDEPENDENT_AMBULATORY_CARE_PROVIDER_SITE_OTHER)
Admission: RE | Admit: 2014-07-20 | Discharge: 2014-07-20 | Disposition: A | Discharge status: Home / Self Care | Payer: BC Managed Care – PPO | Source: Ambulatory Visit | Attending: Internal Medicine | Admitting: Internal Medicine

## 2014-07-20 DIAGNOSIS — R918 Other nonspecific abnormal finding of lung field: Secondary | ICD-10-CM

## 2014-08-18 ENCOUNTER — Other Ambulatory Visit: Payer: Self-pay | Admitting: Internal Medicine

## 2014-08-22 ENCOUNTER — Other Ambulatory Visit: Payer: Self-pay | Admitting: Family Medicine

## 2014-08-22 NOTE — Telephone Encounter (Signed)
Last office visit 07/12/2014 with Dr. Silvio Pate.  Ok to refill?

## 2014-08-26 ENCOUNTER — Ambulatory Visit: Payer: Self-pay | Admitting: Pulmonary Disease

## 2014-08-26 ENCOUNTER — Other Ambulatory Visit: Payer: Self-pay | Admitting: Internal Medicine

## 2014-08-29 ENCOUNTER — Ambulatory Visit (INDEPENDENT_AMBULATORY_CARE_PROVIDER_SITE_OTHER): Payer: BLUE CROSS/BLUE SHIELD | Admitting: Pulmonary Disease

## 2014-08-29 ENCOUNTER — Encounter: Payer: Self-pay | Admitting: Pulmonary Disease

## 2014-08-29 VITALS — BP 126/74 | HR 81 | Temp 97.0°F | Ht 75.0 in | Wt 303.6 lb

## 2014-08-29 DIAGNOSIS — G4733 Obstructive sleep apnea (adult) (pediatric): Secondary | ICD-10-CM

## 2014-08-29 NOTE — Progress Notes (Signed)
Subjective:    Patient ID: Charles Phelps, male    DOB: [birth year], 49 y.o.   MRN: [id number]  HPI The patient is a 49 year old male who I've been asked to see for management of obstructive sleep apnea. The patient was first diagnosed with severe OSA in 2005, where he had an AHI of 86 events per hour. He was started on C Pap, and asked to return in 8 weeks, but he never made an appointment. I have not seen him since, but he has been wearing his C-peptide device very compliantly by his history. His current machine is 51 years old, and has not functioned properly the last one to 2 weeks. Because of this, his sleep has been very poor. The patient uses nasal pillows with a heated humidifier. When his C Pap device was working properly, he slept very well and had excellent daytime alertness. The patient states that his weight is up 70 pounds over the last 10 years, and his Epworth score today is 4.   Sleep Questionnaire What time do you typically go to bed?( Between what hours) 10p-12a 10p-12a at 1521 on 08/29/14 by Inge Rise, CMA How long does it take you to fall asleep? not long  not long  at 1521 on 08/29/14 by Inge Rise, CMA How many times during the night do you wake up? 0 0 at 1521 on 08/29/14 by Inge Rise, [place] What time do you get out of bed to start your day? 0620 0620 at 1521 on 08/29/14 by Inge Rise, CMA Do you drive or operate heavy machinery in your occupation? No No at 1521 on 08/29/14 by Inge Rise, CMA How much has your weight changed (up or down) over the past two years? (In pounds) 10 lb (4.536 kg) 10 lb (4.536 kg) at 1521 on 08/29/14 by Inge Rise, CMA Have you ever had a sleep study before? Yes Yes at 1521 on 08/29/14 by Inge Rise, CMA If yes, [facility] at 1521 on 08/29/14 by Inge Rise, CMA If yes, date of study? 2005 2005 at 1521 on 08/29/14 by Inge Rise, CMA Do you currently use CPAP? Yes Yes at 1521 on  08/29/14 by Inge Rise, CMA If so, what pressure? not sure not sure at 1521 on 08/29/14 by Inge Rise, CMA Do you wear oxygen at any time? No No at 1521 on 08/29/14 by Inge Rise, CMA   Review of Systems  Constitutional: Negative for fever and unexpected weight change.  HENT: Negative for congestion, dental problem, ear pain, nosebleeds, postnasal drip, rhinorrhea, sinus pressure, sneezing, sore throat and trouble swallowing.   Eyes: Negative for redness and itching.  Respiratory: Negative for cough, chest tightness, shortness of breath and wheezing.   Cardiovascular: Negative for palpitations and leg swelling.  Gastrointestinal: Negative for nausea and vomiting.  Genitourinary: Negative for dysuria.  Musculoskeletal: Negative for joint swelling.  Skin: Negative for rash.  Neurological: Negative for headaches.  Hematological: Does not bruise/bleed easily.  Psychiatric/Behavioral: Negative for dysphoric mood. The patient is not nervous/anxious.        Objective:   Physical Exam Constitutional: obese male, no acute distress  HENT:  Nares patent without discharge  Oropharynx without exudate, palate and uvula are thick and elongated.  Eyes:  Perrla, eomi, no scleral icterus  Neck:  No JVD, no TMG  Cardiovascular:  Normal rate, regular rhythm, no rubs or gallops.  No murmurs  Intact distal pulses  Pulmonary :  Normal breath sounds, no stridor or respiratory distress   No rales, rhonchi, or wheezing  Abdominal:  Soft, nondistended, bowel sounds present.  No tenderness noted.   Musculoskeletal:  No lower extremity edema noted.  Lymph Nodes:  No cervical lymphadenopathy noted  Skin:  No cyanosis noted  Neurologic:  Alert, appropriate, moves all 4 extremities without obvious deficit.         Assessment & Plan:

## 2014-08-29 NOTE — Assessment & Plan Note (Signed)
The patient has a history of severe obstructive sleep apnea, and is currently using a C Pap device that is 51 years old, and has gained 75 pounds since his original study. I suspect he is on a suboptimal pressure, and his current device is no longer working and will need to be replaced. I will order a new device for him, but will also put him on the automatic setting so that his sleep apnea is better controlled. I have also encouraged him to work aggressively on weight loss.

## 2014-08-29 NOTE — Patient Instructions (Signed)
Will order you a new cpap device, but will put on auto pressure to better treat your sleep apnea Work on weight loss followup with me in one year if doing well, but call if having pressure issues.

## 2014-08-31 LAB — HM DIABETES EYE EXAM

## 2014-09-15 ENCOUNTER — Encounter: Payer: Self-pay | Admitting: Internal Medicine

## 2015-01-11 ENCOUNTER — Encounter: Payer: Self-pay | Admitting: Internal Medicine

## 2015-01-11 ENCOUNTER — Ambulatory Visit (INDEPENDENT_AMBULATORY_CARE_PROVIDER_SITE_OTHER): Payer: BLUE CROSS/BLUE SHIELD | Admitting: Internal Medicine

## 2015-01-11 VITALS — BP 138/94 | HR 74 | Temp 98.0°F | Wt 299.0 lb

## 2015-01-11 DIAGNOSIS — I10 Essential (primary) hypertension: Secondary | ICD-10-CM | POA: Diagnosis not present

## 2015-01-11 DIAGNOSIS — G4733 Obstructive sleep apnea (adult) (pediatric): Secondary | ICD-10-CM

## 2015-01-11 DIAGNOSIS — E785 Hyperlipidemia, unspecified: Secondary | ICD-10-CM | POA: Diagnosis not present

## 2015-01-11 DIAGNOSIS — E119 Type 2 diabetes mellitus without complications: Secondary | ICD-10-CM | POA: Diagnosis not present

## 2015-01-11 LAB — HEMOGLOBIN A1C: HEMOGLOBIN A1C: 7.5 % — AB (ref 4.6–6.5)

## 2015-01-11 MED ORDER — GLUCOSE BLOOD VI STRP: ORAL_STRIP | Status: DC

## 2015-01-11 MED ORDER — ONETOUCH LANCETS MISC: Status: DC

## 2015-01-11 MED ORDER — PRAVASTATIN SODIUM 40 MG PO TABS: 40.0000 mg | ORAL_TABLET | Freq: Every day | ORAL | Status: DC

## 2015-01-11 NOTE — Assessment & Plan Note (Signed)
Working on lifestyle Hopefully still acceptable control

## 2015-01-11 NOTE — Assessment & Plan Note (Signed)
BP Readings from Last 3 Encounters:  01/11/15 138/94  08/29/14 126/74  07/12/14 140/90   Stressed this AM No changes for now

## 2015-01-11 NOTE — Progress Notes (Signed)
Subjective:    Patient ID: Charles Phelps, male    DOB: Jan 13, 1966, 49 y.o.   MRN: [id number]  HPI Here for follow up of diabetes and other medical problems  Did see Dr Gwenette Greet New machine and on protocol for monitoring and supplies regularly Feels like he is sleeping better Generally awakens refreshed   Checks sugars every other day--but ran out of strips Does get a sense of being mildly low at times---none recently Trying to be more active--foot is better with wrapping and nitro paste Thinking of doing "Get Fit" program  Reviewed his cognitive issues with the atorvastatin Still feels he has some problems since being on this  No chest pain No SOB No dizziness or syncope No edema  Current Outpatient Prescriptions on File Prior to Visit  Medication Sig Dispense Refill  . aspirin EC 81 MG tablet Take 81 mg by mouth every other day.    . cetirizine (ZYRTEC) 10 MG tablet Take 10 mg by mouth daily.      . citalopram (CELEXA) 20 MG tablet TAKE 1 TABLET DAILY 90 tablet 1  . hyoscyamine (LEVBID) 0.375 MG 12 hr tablet TAKE 1 TABLET TWICE A DAY AS NEEDED 180 tablet 3  . lisinopril (PRINIVIL,ZESTRIL) 20 MG tablet TAKE 1 TABLET DAILY 90 tablet 3  . metFORMIN (GLUCOPHAGE) 1000 MG tablet TAKE 1 TABLET TWICE A DAY WITH MEALS 180 tablet 3  . Multiple Vitamin (ONE-A-DAY MENS) TABS Take by mouth.      . nitroGLYCERIN (NITRODUR - DOSED IN MG/24 HR) 0.2 mg/hr patch APPLY 1/4 OF A PATCH TO THE AFFECTED AREA AND CHANGE EVERY 24 HOURS (RE: TENDINOPATHY) 30 patch 5  . omeprazole (PRILOSEC) 20 MG capsule TAKE 1 CAPSULE DAILY 90 capsule 3   No current facility-administered medications on file prior to visit.    Allergies  Allergen Reactions  . Atorvastatin Other (See Comments)    Memory problems    Past Medical History  Diagnosis Date  . Allergic rhinitis   . Anxiety   . GERD (gastroesophageal reflux disease)   . Hyperlipidemia   . Elevated LFTs 2000    fatty liver  . Osteoarthritis    . Glucose intolerance (impaired glucose tolerance)   . Obstructive sleep apnea   . Hypertension   . IBS (irritable bowel syndrome)     No past surgical history on file.  Family History  Problem Relation Age of Onset  . Hypertension Mother   . Diabetes Mother   . Heart disease Father     CABG at 27  . Cancer Father     lung  . Diabetes Sister   . Diabetes Brother     History   Social History  . Marital Status: Married    Spouse Name: N/A  . Number of Children: 1  . Years of Education: N/A   Occupational History  . Industrial / Geologist, engineering     Social History Main Topics  . Smoking status: Never Smoker   . Smokeless tobacco: Current User  . Alcohol Use: Yes     Comment: Occasionally  . Drug Use: No  . Sexual Activity: Not on file   Other Topics Concern  . Not on file   Social History Narrative   Review of Systems  Bowels fine Appetite is good No sores, numbness or pain in feet     Objective:   Physical Exam  Constitutional: He appears well-nourished. No distress.  Neck: Normal range of motion. Neck supple.  No thyromegaly present.  Cardiovascular: Normal rate, regular rhythm, normal heart sounds and intact distal pulses.  Exam reveals no gallop.   No murmur heard. Pulmonary/Chest: Effort normal and breath sounds normal. No respiratory distress. He has no wheezes. He has no rales.  Lymphadenopathy:    He has no cervical adenopathy.  Skin:  No foot lesions  Psychiatric: He has a normal mood and affect. His behavior is normal.          Assessment & Plan:

## 2015-01-11 NOTE — Assessment & Plan Note (Signed)
Better with new machine

## 2015-01-11 NOTE — Progress Notes (Signed)
Pre visit review using our clinic review tool, if applicable. No additional management support is needed unless otherwise documented below in the visit note. 

## 2015-01-11 NOTE — Assessment & Plan Note (Signed)
Cognitive problems with atorvastatin Will try pravastatin

## 2015-02-22 ENCOUNTER — Other Ambulatory Visit: Payer: Self-pay | Admitting: Internal Medicine

## 2015-04-05 ENCOUNTER — Other Ambulatory Visit (INDEPENDENT_AMBULATORY_CARE_PROVIDER_SITE_OTHER): Payer: BLUE CROSS/BLUE SHIELD

## 2015-04-05 DIAGNOSIS — E785 Hyperlipidemia, unspecified: Secondary | ICD-10-CM

## 2015-04-05 LAB — HEPATIC FUNCTION PANEL
ALBUMIN: 4.2 g/dL (ref 3.5–5.2)
ALT: 102 U/L — AB (ref 0–53)
AST: 77 U/L — ABNORMAL HIGH (ref 0–37)
Alkaline Phosphatase: 92 U/L (ref 39–117)
Bilirubin, Direct: 0.2 mg/dL (ref 0.0–0.3)
Total Bilirubin: 1 mg/dL (ref 0.2–1.2)
Total Protein: 6.6 g/dL (ref 6.0–8.3)

## 2015-04-05 LAB — LIPID PANEL
Cholesterol: 193 mg/dL (ref 0–200)
HDL: 37.8 mg/dL — AB (ref >39.00)
NonHDL: 155.68
Total CHOL/HDL Ratio: 5
Triglycerides: 220 mg/dL — ABNORMAL HIGH (ref 0.0–149.0)
VLDL: 44 mg/dL — ABNORMAL HIGH (ref 0.0–40.0)

## 2015-04-05 LAB — LDL CHOLESTEROL, DIRECT: Direct LDL: 129 mg/dL

## 2015-05-28 ENCOUNTER — Encounter: Payer: Self-pay | Admitting: Internal Medicine

## 2015-05-28 DIAGNOSIS — Z0000 Encounter for general adult medical examination without abnormal findings: Secondary | ICD-10-CM

## 2015-05-31 NOTE — Telephone Encounter (Signed)
Please let him know that they want a test to show he isn't a smoker and we didn't do that He needs to come in for a urine test for this--then we can finish the forms  No charge Set up the lab appt (order is in)

## 2015-05-31 NOTE — Telephone Encounter (Signed)
Pt dropped off form to be completed. Form in Dr. Everardo [facility] in-box. Thank you.  CB # [phone]

## 2015-05-31 NOTE — Telephone Encounter (Signed)
Form on your desk  

## 2015-06-05 ENCOUNTER — Encounter: Payer: Self-pay | Admitting: Internal Medicine

## 2015-06-05 ENCOUNTER — Ambulatory Visit (INDEPENDENT_AMBULATORY_CARE_PROVIDER_SITE_OTHER): Payer: BLUE CROSS/BLUE SHIELD | Admitting: Internal Medicine

## 2015-06-05 VITALS — Wt 295.0 lb

## 2015-06-05 DIAGNOSIS — Z0000 Encounter for general adult medical examination without abnormal findings: Secondary | ICD-10-CM

## 2015-06-05 DIAGNOSIS — Z021 Encounter for pre-employment examination: Secondary | ICD-10-CM

## 2015-06-05 NOTE — Progress Notes (Signed)
   Subjective:    Patient ID: Charles Phelps, male    DOB: [birth year], 49 y.o.   MRN: [id number]  HPI  Just needed urine cotinine  Review of Systems     Objective:   Physical Exam        Assessment & Plan:

## 2015-06-05 NOTE — Assessment & Plan Note (Signed)
Just needed urine cotinine

## 2015-06-05 NOTE — Progress Notes (Signed)
Pre visit review using our clinic review tool, if applicable. No additional management support is needed unless otherwise documented below in the visit note. 

## 2015-06-06 LAB — NICOTINE/COTININE METABOLITES

## 2015-06-07 ENCOUNTER — Encounter: Payer: Self-pay | Admitting: Pulmonary Disease

## 2015-06-27 ENCOUNTER — Other Ambulatory Visit: Payer: Self-pay | Admitting: Internal Medicine

## 2015-07-21 ENCOUNTER — Encounter: Payer: Self-pay | Admitting: Internal Medicine

## 2015-07-21 ENCOUNTER — Other Ambulatory Visit: Payer: Self-pay | Admitting: Internal Medicine

## 2015-07-21 ENCOUNTER — Ambulatory Visit (INDEPENDENT_AMBULATORY_CARE_PROVIDER_SITE_OTHER): Payer: BLUE CROSS/BLUE SHIELD | Admitting: Internal Medicine

## 2015-07-21 VITALS — BP 138/88 | HR 82 | Temp 98.6°F | Ht 75.0 in | Wt 296.0 lb

## 2015-07-21 DIAGNOSIS — I10 Essential (primary) hypertension: Secondary | ICD-10-CM

## 2015-07-21 DIAGNOSIS — F39 Unspecified mood [affective] disorder: Secondary | ICD-10-CM

## 2015-07-21 DIAGNOSIS — Z0000 Encounter for general adult medical examination without abnormal findings: Secondary | ICD-10-CM

## 2015-07-21 DIAGNOSIS — E119 Type 2 diabetes mellitus without complications: Secondary | ICD-10-CM | POA: Diagnosis not present

## 2015-07-21 DIAGNOSIS — E785 Hyperlipidemia, unspecified: Secondary | ICD-10-CM

## 2015-07-21 LAB — COMPREHENSIVE METABOLIC PANEL
ALK PHOS: 97 U/L (ref 39–117)
ALT: 72 U/L — ABNORMAL HIGH (ref 0–53)
AST: 36 U/L (ref 0–37)
Albumin: 4.2 g/dL (ref 3.5–5.2)
BILIRUBIN TOTAL: 1.1 mg/dL (ref 0.2–1.2)
BUN: 17 mg/dL (ref 6–23)
CALCIUM: 9.4 mg/dL (ref 8.4–10.5)
CO2: 29 meq/L (ref 19–32)
Chloride: 98 mEq/L (ref 96–112)
Creatinine, Ser: 1.17 mg/dL (ref 0.40–1.50)
GFR: 69.8 mL/min (ref >60.00)
GLUCOSE: 228 mg/dL — AB (ref 70–99)
POTASSIUM: 4 meq/L (ref 3.5–5.1)
Sodium: 137 mEq/L (ref 135–145)
Total Protein: 7 g/dL (ref 6.0–8.3)

## 2015-07-21 LAB — CBC WITH DIFFERENTIAL/PLATELET
BASOS ABS: 0 10*3/uL (ref 0.0–0.1)
Basophils Relative: 0.8 % (ref 0.0–3.0)
EOS PCT: 1.7 % (ref 0.0–5.0)
Eosinophils Absolute: 0.1 10*3/uL (ref 0.0–0.7)
HEMATOCRIT: 43 % (ref 39.0–52.0)
Hemoglobin: 14.4 g/dL (ref 13.0–17.0)
LYMPHS ABS: 1.4 10*3/uL (ref 0.7–4.0)
LYMPHS PCT: 21.7 % (ref 12.0–46.0)
MCHC: 33.5 g/dL (ref 30.0–36.0)
MCV: 84.8 fl (ref 78.0–100.0)
MONOS PCT: 7.3 % (ref 3.0–12.0)
Monocytes Absolute: 0.5 10*3/uL (ref 0.1–1.0)
NEUTROS ABS: 4.4 10*3/uL (ref 1.4–7.7)
NEUTROS PCT: 68.5 % (ref 43.0–77.0)
Platelets: 176 10*3/uL (ref 150.0–400.0)
RBC: 5.07 Mil/uL (ref 4.22–5.81)
RDW: 13.9 % (ref 11.5–15.5)
WBC: 6.5 10*3/uL (ref 4.0–10.5)

## 2015-07-21 LAB — HEMOGLOBIN A1C: Hgb A1c MFr Bld: 8.1 % — ABNORMAL HIGH (ref 4.6–6.5)

## 2015-07-21 LAB — HM DIABETES FOOT EXAM

## 2015-07-21 NOTE — Assessment & Plan Note (Signed)
BP Readings from Last 3 Encounters:  07/21/15 138/88  01/11/15 138/94  08/29/14 126/74   Good control

## 2015-07-21 NOTE — Assessment & Plan Note (Signed)
Hopefully still acceptable control

## 2015-07-21 NOTE — Assessment & Plan Note (Signed)
Cognitive changes with 2 statins Will hold off on further Rx for now

## 2015-07-21 NOTE — Progress Notes (Signed)
Subjective:    Patient ID: Charles Phelps, male    DOB: October 07, 1965, 49 y.o.   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 aspirin EC 81 MG tablet Take 81 mg by mouth every other day.    . cetirizine (ZYRTEC) 10 MG tablet Take 10 mg by mouth daily.      . citalopram (CELEXA) 20 MG tablet TAKE 1 TABLET DAILY 90 tablet 3  . glucose blood (ONE TOUCH TEST STRIPS) test strip Use as instructed to check blood sugar once daily dx: E11.65 100 each 3  . hyoscyamine (LEVBID) 0.375 MG 12 hr tablet TAKE 1 TABLET TWICE A DAY AS NEEDED 180 tablet 3  . lisinopril (PRINIVIL,ZESTRIL) 20 MG tablet TAKE 1 TABLET DAILY 90 tablet 3  . metFORMIN (GLUCOPHAGE) 1000 MG tablet TAKE 1 TABLET TWICE A DAY WITH MEALS 180 tablet 3  . Multiple Vitamin (ONE-A-DAY MENS) TABS Take by mouth.      . nitroGLYCERIN (NITRODUR - DOSED IN MG/24 HR) 0.2 mg/hr patch APPLY 1/4 OF A PATCH TO THE AFFECTED AREA AND CHANGE EVERY 24 HOURS (RE: TENDINOPATHY) 30 patch 5  . omeprazole (PRILOSEC) 20 MG capsule TAKE 1 CAPSULE DAILY 90 capsule 3  . ONETOUCH DELICA LANCETS FINE MISC USE TO CHECK BLOOD SUGAR ONCE DAILY 200 each 0   No current facility-administered medications on file prior to visit.    Allergies  Allergen Reactions  . Atorvastatin Other (See Comments)    Memory problems    Past Medical History  Diagnosis Date  . Allergic rhinitis   . Anxiety   . GERD (gastroesophageal  reflux disease)   . Hyperlipidemia   . Elevated LFTs 2000    fatty liver  . Osteoarthritis   . Glucose intolerance (impaired glucose tolerance)   . Obstructive sleep apnea   . Hypertension   . IBS (irritable bowel syndrome)     No past surgical history on file.  Family History  Problem Relation Age of Onset  . Hypertension Mother   . Diabetes Mother   . Heart disease Father     CABG at 34  . Cancer Father     lung  . Diabetes Sister   . Diabetes Brother     Social History   Social History  . Marital Status: Married    Spouse Name: N/A  . Number of Children: 1  . Years of Education: N/A   Occupational History  . Industrial / Geologist, engineering     Social History Main Topics  . Smoking status: Never Smoker   . Smokeless tobacco: Current User  . Alcohol Use: Yes     Comment: Occasionally  . Drug Use: No  . Sexual Activity: Not on file   Other Topics Concern  . Not on file   Social History Narrative   Review of Systems  Constitutional: Negative for fatigue and unexpected weight change.  Wears seat belt  HENT: Negative for hearing loss.        Keeps up with dentist  Eyes: Negative for visual disturbance.       No diplopia or unilateral vision loss  Respiratory: Negative for cough, chest tightness and shortness of breath.   Cardiovascular: Positive for palpitations. Negative for leg swelling.       Gets some vague chest symptoms--relates to stress (from work). Never exertional. Feels ?harder beat  Gastrointestinal: Negative for nausea, vomiting, abdominal pain and blood in stool.       Variable bowels--- some IBS (hyoscamine helps)  Endocrine: Negative for polydipsia and polyuria.  Genitourinary: Negative for urgency and difficulty urinating.       No sexual problems  Musculoskeletal: Negative for myalgias, back pain and arthralgias.  Skin: Negative for rash.       No suspicious lesions  Allergic/Immunologic: Positive for environmental allergies.  Negative for immunocompromised state.  Neurological: Negative for dizziness, syncope, weakness, light-headedness and headaches.  Psychiatric/Behavioral: Negative for sleep disturbance and dysphoric mood.       Objective:   Physical Exam  Constitutional: He appears well-developed and well-nourished. No distress.  HENT:  Head: Normocephalic and atraumatic.  Right Ear: External ear normal.  Left Ear: External ear normal.  Mouth/Throat: Oropharynx is clear and moist. No oropharyngeal exudate.  Eyes: Conjunctivae and EOM are normal. Pupils are equal, round, and reactive to light.  Neck: Normal range of motion. Neck supple. No thyromegaly present.  Cardiovascular: Normal rate, regular rhythm, normal heart sounds and intact distal pulses.  Exam reveals no gallop.   No murmur heard. Pulmonary/Chest: Effort normal and breath sounds normal. No respiratory distress. He has no wheezes. He has no rales.  Abdominal: Soft. There is no tenderness.  Musculoskeletal: He exhibits no edema or tenderness.  Lymphadenopathy:    He has no cervical adenopathy.  Neurological:  Normal sensation in feet  Skin: No rash noted. No erythema.  Slight stasis changes in calves No foot lesions  Psychiatric: He has a normal mood and affect. His behavior is normal.          Assessment & Plan:

## 2015-07-21 NOTE — Progress Notes (Signed)
Pre visit review using our clinic review tool, if applicable. No additional management support is needed unless otherwise documented below in the visit note. 

## 2015-07-21 NOTE — Assessment & Plan Note (Addendum)
Healthy but still needs to work on fitness Defer PSA Needs to do fecal immunoassay Still prefers no flu shot

## 2015-07-21 NOTE — Assessment & Plan Note (Signed)
Anxiety and easy anger Continue the SSRI

## 2015-08-09 LAB — HM DIABETES EYE EXAM

## 2015-08-19 ENCOUNTER — Other Ambulatory Visit: Payer: Self-pay | Admitting: Internal Medicine

## 2015-08-23 ENCOUNTER — Encounter: Payer: Self-pay | Admitting: Internal Medicine

## 2015-08-30 ENCOUNTER — Ambulatory Visit: Payer: BLUE CROSS/BLUE SHIELD | Admitting: Pulmonary Disease

## 2015-09-03 ENCOUNTER — Encounter (HOSPITAL_COMMUNITY): Payer: Self-pay | Admitting: *Deleted

## 2015-09-03 ENCOUNTER — Emergency Department (INDEPENDENT_AMBULATORY_CARE_PROVIDER_SITE_OTHER)
Admission: EM | Admit: 2015-09-03 | Discharge: 2015-09-03 | Disposition: A | Discharge status: Home / Self Care | Payer: BLUE CROSS/BLUE SHIELD | Source: Home / Self Care | Attending: Family Medicine | Admitting: Family Medicine

## 2015-09-03 DIAGNOSIS — N390 Urinary tract infection, site not specified: Secondary | ICD-10-CM

## 2015-09-03 LAB — POCT URINALYSIS DIP (DEVICE)
GLUCOSE, UA: 100 mg/dL — AB
Ketones, ur: 15 mg/dL — AB
NITRITE: POSITIVE — AB
SPECIFIC GRAVITY, URINE: 1.015 (ref 1.005–1.030)
UROBILINOGEN UA: 1 mg/dL (ref 0.0–1.0)
pH: 6.5 (ref 5.0–8.0)

## 2015-09-03 MED ORDER — CEPHALEXIN 500 MG PO CAPS: 500.0000 mg | ORAL_CAPSULE | Freq: Four times a day (QID) | ORAL | Status: DC

## 2015-09-03 NOTE — Discharge Instructions (Signed)
Take all of medicine as directed, drink lots of fluids, see your doctor if further problems. °

## 2015-09-03 NOTE — ED Provider Notes (Signed)
CSN: [id number]     Arrival date & time 09/03/15  1937 History   First MD Initiated Contact with Patient 09/03/15 1944     No chief complaint on file.  (Consider location/radiation/quality/duration/timing/severity/associated sxs/prior Treatment) Patient is a 50 y.o. male presenting with dysuria. The history is provided by the patient.  Dysuria This is a new problem. The current episode started 1 to 2 hours ago. The problem has been gradually worsening. Pertinent negatives include no chest pain and no abdominal pain.    Past Medical History  Diagnosis Date  . Allergic rhinitis   . Anxiety   . GERD (gastroesophageal reflux disease)   . Hyperlipidemia   . Elevated LFTs 2000    fatty liver  . Osteoarthritis   . Glucose intolerance (impaired glucose tolerance)   . Obstructive sleep apnea   . Hypertension   . IBS (irritable bowel syndrome)    No past surgical history on file. Family History  Problem Relation Age of Onset  . Hypertension Mother   . Diabetes Mother   . Heart disease Father     CABG at 90  . Cancer Father     lung  . Diabetes Sister   . Diabetes Brother    Social History  Substance Use Topics  . Smoking status: Never Smoker   . Smokeless tobacco: Current User  . Alcohol Use: Yes     Comment: Occasionally    Review of Systems  Constitutional: Negative.  Negative for fever and chills.  Cardiovascular: Negative.  Negative for chest pain.  Gastrointestinal: Negative.  Negative for abdominal pain.  Genitourinary: Positive for dysuria, urgency, frequency and hematuria.  Musculoskeletal: Negative.   All other systems reviewed and are negative.   Allergies  Atorvastatin  Home Medications   Prior to Admission medications   Medication Sig Start Date End Date Taking? Authorizing Provider  aspirin EC 81 MG tablet Take 81 mg by mouth every other day.    Historical Provider, MD  cetirizine (ZYRTEC) 10 MG tablet Take 10 mg by mouth daily.      Historical  Provider, MD  citalopram (CELEXA) 20 MG tablet TAKE 1 TABLET DAILY 02/22/15   Venia Carbon, MD  glucose blood (ONE TOUCH TEST STRIPS) test strip Use as instructed to check blood sugar once daily dx: E11.65 01/11/15   Venia Carbon, MD  hyoscyamine (LEVBID) 0.375 MG 12 hr tablet TAKE 1 TABLET TWICE A DAY AS NEEDED 08/21/15   Venia Carbon, MD  lisinopril (PRINIVIL,ZESTRIL) 20 MG tablet TAKE 1 TABLET DAILY 08/21/15   Venia Carbon, MD  metFORMIN (GLUCOPHAGE) 1000 MG tablet TAKE 1 TABLET TWICE A DAY WITH MEALS 07/21/15   Venia Carbon, MD  Multiple Vitamin (ONE-A-DAY MENS) TABS Take by mouth.      Historical Provider, MD  nitroGLYCERIN (NITRODUR - DOSED IN MG/24 HR) 0.2 mg/hr patch APPLY 1/4 OF A PATCH TO THE AFFECTED AREA AND CHANGE EVERY 24 HOURS (RE: TENDINOPATHY) 08/22/14   Owens Loffler, MD  omeprazole (PRILOSEC) 20 MG capsule TAKE 1 CAPSULE DAILY 08/21/15   Venia Carbon, MD  [facility] DELICA LANCETS FINE MISC USE TO CHECK BLOOD SUGAR ONCE DAILY 06/27/15   Venia Carbon, MD   Meds Ordered and Administered this Visit  Medications - No data to display  BP 168/113 mmHg  Pulse 110  Temp(Src) 98.1 F (36.7 C) (Oral)  SpO2 96% No data found.   Physical Exam  Constitutional: He is oriented to person, place,  and time. He appears well-developed and well-nourished. No distress.  Abdominal: Soft. Bowel sounds are normal. He exhibits no mass. There is no tenderness. There is no rebound and no guarding.  Neurological: He is alert and oriented to person, place, and time.  Skin: Skin is warm and dry.  Nursing note and vitals reviewed.   ED Course  Procedures (including critical care time)  Labs Review Labs Reviewed - No data to display  Imaging Review No results found.   Visual Acuity Review  Right Eye Distance:   Left Eye Distance:   Bilateral Distance:    Right Eye Near:   Left Eye Near:    Bilateral Near:         MDM  No diagnosis found.  Meds ordered  this encounter  Medications  . metFORMIN (GLUCOPHAGE) 500 MG tablet    Sig: Take by mouth 2 (two) times daily with a meal.  . cephALEXin (KEFLEX) 500 MG capsule    Sig: Take 1 capsule (500 mg total) by mouth 4 (four) times daily. Take all of medicine and drink lots of fluids    Dispense:  20 capsule    Refill:  0      Billy Fischer, MD 09/03/15 [phone]

## 2015-09-03 NOTE — ED Notes (Signed)
Started with dysuria @ 1600 today.  Around 1730 noticed hematuria.  C/O polyuria and urinary urgency.  Denies abd, back, or flank pain.  Denies fevers.

## 2015-09-04 ENCOUNTER — Telehealth: Payer: Self-pay | Admitting: *Deleted

## 2015-09-04 NOTE — Telephone Encounter (Signed)
Called and left message asking pt how he's doing after his visit to the ER, asked pt to return my call.

## 2015-09-05 ENCOUNTER — Encounter: Payer: Self-pay | Admitting: Primary Care

## 2015-09-05 ENCOUNTER — Ambulatory Visit (INDEPENDENT_AMBULATORY_CARE_PROVIDER_SITE_OTHER): Payer: BLUE CROSS/BLUE SHIELD | Admitting: Primary Care

## 2015-09-05 VITALS — BP 146/94 | HR 97 | Temp 98.5°F | Ht 75.0 in | Wt 291.4 lb

## 2015-09-05 DIAGNOSIS — R319 Hematuria, unspecified: Secondary | ICD-10-CM

## 2015-09-05 DIAGNOSIS — N390 Urinary tract infection, site not specified: Secondary | ICD-10-CM

## 2015-09-05 LAB — POC URINALSYSI DIPSTICK (AUTOMATED)
Nitrite, UA: NEGATIVE
SPEC GRAV UA: 1.025
Urobilinogen, UA: 0.2
pH, UA: 6

## 2015-09-05 MED ORDER — CIPROFLOXACIN HCL 500 MG PO TABS: 500.0000 mg | ORAL_TABLET | Freq: Two times a day (BID) | ORAL | Status: DC

## 2015-09-05 NOTE — Addendum Note (Signed)
Addended by: Jacqualin Combes on: 09/05/2015 04:48 PM   Modules accepted: Orders, SmartSet

## 2015-09-05 NOTE — Progress Notes (Signed)
Subjective:    Patient ID: Charles Phelps, male    DOB: 05/18/1966, 50 y.o.   MRN: [id number]  HPI  Charles Phelps is a 50 year old male who presents today with a chief complaint of flank pain and fever. He was evaluated at Urgent Care on 09/03/15 for further evaluation of dysuria, frequency, hematuria, frequency.  He was diagnosed and treated for a urinary tract infection as his UA resulted with large amount of leukocytes, positive nitrites, and a large amount of blood. He was provided with a prescription for cephalexin 500 mg 10 day course.   Since his urgent care visit he developed a fever, body aches, and flank pain on Monday that has been constant since. His fevers have been running between 100 to 101. Denies urinary frequency, hematuria, and pelvic pressure. He has not had much water over the past several days as he's felt nauseated. He's been taking tylenol for fevers and body aches with temporary improvement.  Review of Systems  Constitutional: Positive for fever, chills and fatigue.  Genitourinary: Positive for flank pain. Negative for dysuria, urgency, frequency, hematuria and difficulty urinating.       Past Medical History  Diagnosis Date  . Allergic rhinitis   . Anxiety   . GERD (gastroesophageal reflux disease)   . Hyperlipidemia   . Elevated LFTs 2000    fatty liver  . Osteoarthritis   . Glucose intolerance (impaired glucose tolerance)   . Obstructive sleep apnea   . Hypertension   . IBS (irritable bowel syndrome)     Social History   Social History  . Marital Status: Married    Spouse Name: N/A  . Number of Children: 1  . Years of Education: N/A   Occupational History  . Industrial / Geologist, engineering     Social History Main Topics  . Smoking status: Never Smoker   . Smokeless tobacco: Current User  . Alcohol Use: Yes     Comment: Occasionally  . Drug Use: No  . Sexual Activity: Not on file   Other Topics Concern  . Not on file   Social History  Narrative    No past surgical history on file.  Family History  Problem Relation Age of Onset  . Hypertension Mother   . Diabetes Mother   . Heart disease Father     CABG at 108  . Cancer Father     lung  . Diabetes Sister   . Diabetes Brother     Allergies  Allergen Reactions  . Atorvastatin Other (See Comments)    Memory problems    Current Outpatient Prescriptions on File Prior to Visit  Medication Sig Dispense Refill  . aspirin EC 81 MG tablet Take 81 mg by mouth every other day.    . cephALEXin (KEFLEX) 500 MG capsule Take 1 capsule (500 mg total) by mouth 4 (four) times daily. Take all of medicine and drink lots of fluids 20 capsule 0  . cetirizine (ZYRTEC) 10 MG tablet Take 10 mg by mouth daily.      . citalopram (CELEXA) 20 MG tablet TAKE 1 TABLET DAILY 90 tablet 3  . glucose blood (ONE TOUCH TEST STRIPS) test strip Use as instructed to check blood sugar once daily dx: E11.65 100 each 3  . hyoscyamine (LEVBID) 0.375 MG 12 hr tablet TAKE 1 TABLET TWICE A DAY AS NEEDED 180 tablet 3  . lisinopril (PRINIVIL,ZESTRIL) 20 MG tablet TAKE 1 TABLET DAILY 90 tablet 3  .  metFORMIN (GLUCOPHAGE) 1000 MG tablet TAKE 1 TABLET TWICE A DAY WITH MEALS 180 tablet 3  . metFORMIN (GLUCOPHAGE) 500 MG tablet Take by mouth 2 (two) times daily with a meal.    . Multiple Vitamin (ONE-A-DAY MENS) TABS Take by mouth.      . nitroGLYCERIN (NITRODUR - DOSED IN MG/24 HR) 0.2 mg/hr patch APPLY 1/4 OF A PATCH TO THE AFFECTED AREA AND CHANGE EVERY 24 HOURS (RE: TENDINOPATHY) 30 patch 5  . omeprazole (PRILOSEC) 20 MG capsule TAKE 1 CAPSULE DAILY 90 capsule 3  . ONETOUCH DELICA LANCETS FINE MISC USE TO CHECK BLOOD SUGAR ONCE DAILY 200 each 0   No current facility-administered medications on file prior to visit.    BP 146/94 mmHg  Pulse 97  Temp(Src) 98.5 F (36.9 C) (Oral)  Ht 6\' 3"  (1.905 m)  Wt 291 lb 6.4 oz (132.178 kg)  BMI 36.42 kg/m2  SpO2 98%    Objective:   Physical Exam    Constitutional: He appears well-nourished.  Cardiovascular: Normal rate and regular rhythm.   Pulmonary/Chest: Effort normal and breath sounds normal.  Abdominal: Soft. Normal appearance and bowel sounds are normal. There is no tenderness. There is no CVA tenderness.  Skin: Skin is warm and dry.          Assessment & Plan:  Urinary tract infection:  Evaluated at UC on 09/03/15 and diagnosed with UTI. Placed on 10 day Keflex course. No culture seen on file. Fevers and flank pain since Monday, slight nausea. UA today: Trace leuks, negative nitirites, 3+ blood, 2+glucose (history of DM). Overall improvement from prior UA. Culture sent. Due to fevers and flank pain, am concerned for possible renal involvement will switch to Cipro course. Cipro BID x 7 days.  Strongly encouraged water intake and rest. Return precautions provided.

## 2015-09-05 NOTE — Patient Instructions (Signed)
Stop cephalexin antibiotics. Start ciprofloxacin antibiotics. Take 1 tablet by mouth twice daily for 7 days.  Increase consumption of water to ensure hydration. Continue tylenol or ibuprofen as needed for fevers and body aches.  Please call me if fevers do not resolve, pain becomes worse, your feel worse.  It was a pleasure meeting you!

## 2015-09-06 NOTE — Telephone Encounter (Signed)
Pt was seen 09/05/2015

## 2015-09-07 ENCOUNTER — Encounter: Payer: Self-pay | Admitting: Primary Care

## 2015-09-07 LAB — URINE CULTURE
COLONY COUNT: NO GROWTH
Organism ID, Bacteria: NO GROWTH

## 2015-09-07 NOTE — Telephone Encounter (Signed)
Pt called stating he saw kate 2/7 she gave him a different antibiotic.   Pt stated he has not sleep since 1:30pm.  He did go to work today but is afraid he will crash if he doesn't not get any sleep tonight Please advise pt what he needs to do  Pt would like a call back today

## 2015-09-07 NOTE — Telephone Encounter (Signed)
Spoke with patient regarding concerns. At this point the benefits of the antibiotic are more important, will discuss with PCP. He is to call with an update tomorrow morning. He will take one of the trazodone tablets he has prescribed.

## 2015-09-07 NOTE — Telephone Encounter (Signed)
Best number to call [phone]

## 2015-09-08 ENCOUNTER — Encounter: Payer: Self-pay | Admitting: Primary Care

## 2015-09-26 ENCOUNTER — Encounter: Payer: Self-pay | Admitting: Pulmonary Disease

## 2015-09-26 ENCOUNTER — Ambulatory Visit (INDEPENDENT_AMBULATORY_CARE_PROVIDER_SITE_OTHER): Payer: BLUE CROSS/BLUE SHIELD | Admitting: Pulmonary Disease

## 2015-09-26 VITALS — BP 110/82 | HR 78 | Ht 75.0 in | Wt 295.4 lb

## 2015-09-26 DIAGNOSIS — G4733 Obstructive sleep apnea (adult) (pediatric): Secondary | ICD-10-CM

## 2015-09-26 DIAGNOSIS — J301 Allergic rhinitis due to pollen: Secondary | ICD-10-CM

## 2015-09-26 NOTE — Patient Instructions (Signed)
We will review report CPAP supplies will be renewed x 1 year

## 2015-09-26 NOTE — Progress Notes (Signed)
   Subjective:    Patient ID: MANLY GLENN, male    DOB: 1966-02-04, 50 y.o.   MRN: [id number]  HPI  Chief Complaint  Patient presents with  . Follow-up    patient doing well on CPAP.  did not bring SD card, no download available.  needs new supplies.      [place] pt, annual FU Received new CPAP 08/2014 ? Auto settings  C/o nasal pressure No dryness  nasal pillows ok Getting supplies on time     NPSG 2005:  AHI 86/hr. Review of Systems neg for any significant sore throat, dysphagia, itching, sneezing, nasal congestion or excess/ purulent secretions, fever, chills, sweats, unintended wt loss, pleuritic or exertional cp,  Also denies presyncope, palpitations, heartburn, abdominal pain, nausea, vomiting, diarrhea or change in bowel or urinary habits, dysuria,hematuria, rash, arthralgias, visual complaints, headache     Objective:   Physical Exam  Gen. Pleasant, obese, in no distress ENT - no lesions, no post nasal drip Neck: No JVD, no thyromegaly, no carotid bruits Lungs: no use of accessory muscles, no dullness to percussion, decreased without rales or rhonchi  Cardiovascular: Rhythm regular, heart sounds  normal, no murmurs or gallops, no peripheral edema Musculoskeletal: No deformities, no cyanosis or clubbing , no tremors       Assessment & Plan:

## 2015-09-26 NOTE — Assessment & Plan Note (Signed)
Ct zyrtec

## 2015-09-26 NOTE — Assessment & Plan Note (Signed)
We will review report CPAP supplies will be renewed x 1 year  Weight loss encouraged, compliance with goal of at least 6 hrs every night is the expectation. Advised against medications with sedative side effects Cautioned against driving when sleepy - understanding that sleepiness will vary on a day to day basis

## 2015-09-27 NOTE — Addendum Note (Signed)
Addended by: Mathis Dad on: 09/27/2015 10:16 AM   Modules accepted: Orders

## 2015-11-02 ENCOUNTER — Telehealth: Payer: Self-pay | Admitting: Pulmonary Disease

## 2015-11-02 NOTE — Telephone Encounter (Signed)
Per Dr. Elsworth Soho:  Compliance report dated 10/26/15 shows on auto cpap Avg 12cm is very effective, good usuage. Continue on same pressure settings.

## 2015-11-07 NOTE — Telephone Encounter (Signed)
Left message for patient to call back  

## 2015-11-09 NOTE — Telephone Encounter (Signed)
Patient aware. Nothing further needed. 

## 2015-11-17 ENCOUNTER — Encounter: Payer: Self-pay | Admitting: Pulmonary Disease

## 2015-12-24 ENCOUNTER — Other Ambulatory Visit: Payer: Self-pay | Admitting: Internal Medicine

## 2016-01-19 ENCOUNTER — Ambulatory Visit: Payer: BLUE CROSS/BLUE SHIELD | Admitting: Internal Medicine

## 2016-01-26 ENCOUNTER — Ambulatory Visit (INDEPENDENT_AMBULATORY_CARE_PROVIDER_SITE_OTHER): Payer: BLUE CROSS/BLUE SHIELD | Admitting: Internal Medicine

## 2016-01-26 ENCOUNTER — Encounter: Payer: Self-pay | Admitting: Internal Medicine

## 2016-01-26 VITALS — BP 136/100 | HR 68 | Temp 98.2°F | Wt 293.0 lb

## 2016-01-26 DIAGNOSIS — E119 Type 2 diabetes mellitus without complications: Secondary | ICD-10-CM

## 2016-01-26 DIAGNOSIS — F39 Unspecified mood [affective] disorder: Secondary | ICD-10-CM

## 2016-01-26 DIAGNOSIS — J3489 Other specified disorders of nose and nasal sinuses: Secondary | ICD-10-CM

## 2016-01-26 LAB — HEMOGLOBIN A1C: HEMOGLOBIN A1C: 7.4 % — AB (ref 4.6–6.5)

## 2016-01-26 MED ORDER — CEPHALEXIN 500 MG PO CAPS: 500.0000 mg | ORAL_CAPSULE | Freq: Three times a day (TID) | ORAL | Status: DC

## 2016-01-26 NOTE — Assessment & Plan Note (Signed)
Generally okay

## 2016-01-26 NOTE — Patient Instructions (Signed)
Please try the antibiotic for 5 days. If that spot on your nose doesn't get better, you need to see a dermatologist.

## 2016-01-26 NOTE — Progress Notes (Signed)
Subjective:    Patient ID: Charles Phelps, male    DOB: 12/27/1965, 50 y.o.   MRN: [id number]  HPI Here for follow up of diabetes and other chronic health conditions  Generally doing well--other than his 50 year old waking him at 3:30AM Does have a sore on the inside of his right nare Relates to his nasal pillows Using neosporin on it and nasal piece Goes back a month or so Very tender--wonders about an ingrown hair  Not checking sugars Has been trying to be careful No hypoglycemics No foot numbness, sores or pain  Mood generally good Nothing unusual  Current Outpatient Prescriptions on File Prior to Visit  Medication Sig Dispense Refill  . aspirin EC 81 MG tablet Take 81 mg by mouth every other day.    . cetirizine (ZYRTEC) 10 MG tablet Take 10 mg by mouth daily.      . citalopram (CELEXA) 20 MG tablet TAKE 1 TABLET DAILY 90 tablet 3  . hyoscyamine (LEVBID) 0.375 MG 12 hr tablet TAKE 1 TABLET TWICE A DAY AS NEEDED 180 tablet 3  . lisinopril (PRINIVIL,ZESTRIL) 20 MG tablet TAKE 1 TABLET DAILY 90 tablet 3  . metFORMIN (GLUCOPHAGE) 1000 MG tablet TAKE 1 TABLET TWICE A DAY WITH MEALS 180 tablet 3  . metFORMIN (GLUCOPHAGE) 500 MG tablet Take by mouth 2 (two) times daily with a meal.    . Multiple Vitamin (ONE-A-DAY MENS) TABS Take by mouth.      Marland Kitchen omeprazole (PRILOSEC) 20 MG capsule TAKE 1 CAPSULE DAILY 90 capsule 3  . ONE TOUCH ULTRA TEST test strip USE AS INSTRUCTED TO CHECK BLOOD SUGAR ONCE DAILY 100 each 2  . ONETOUCH DELICA LANCETS FINE MISC USE TO CHECK BLOOD SUGAR ONCE DAILY 200 each 3   No current facility-administered medications on file prior to visit.    Allergies  Allergen Reactions  . Atorvastatin Other (See Comments)    Memory problems    Past Medical History  Diagnosis Date  . Allergic rhinitis   . Anxiety   . GERD (gastroesophageal reflux disease)   . Hyperlipidemia   . Elevated LFTs 2000    fatty liver  . Osteoarthritis   . Glucose intolerance  (impaired glucose tolerance)   . Obstructive sleep apnea   . Hypertension   . IBS (irritable bowel syndrome)     No past surgical history on file.  Family History  Problem Relation Age of Onset  . Hypertension Mother   . Diabetes Mother   . Heart disease Father     CABG at 9  . Cancer Father     lung  . Diabetes Sister   . Diabetes Brother     Social History   Social History  . Marital Status: Married    Spouse Name: N/A  . Number of Children: 1  . Years of Education: N/A   Occupational History  . Industrial / Geologist, engineering     Social History Main Topics  . Smoking status: Never Smoker   . Smokeless tobacco: Current User  . Alcohol Use: Yes     Comment: Occasionally  . Drug Use: No  . Sexual Activity: Not on file   Other Topics Concern  . Not on file   Social History Narrative   Review of Systems Occasional constipation--chocolate will help Sleeps okay with CPAP Appetite is good Tendonitis is better--no longer using nitro patch    Objective:   Physical Exam  Constitutional: He appears well-developed and  well-nourished. No distress.  HENT:  Raised inflamed lesion at corner of inside corner of right nare  Neck: Normal range of motion. Neck supple. No thyromegaly present.  Cardiovascular: Normal rate, regular rhythm, normal heart sounds and intact distal pulses.  Exam reveals no gallop.   No murmur heard. Pulmonary/Chest: Effort normal and breath sounds normal. No respiratory distress. He has no wheezes. He has no rales.  Musculoskeletal: He exhibits no edema.  Lymphadenopathy:    He has no cervical adenopathy.  Skin:  Slight callous on both plantar great toes  Psychiatric: He has a normal mood and affect. His behavior is normal.          Assessment & Plan:

## 2016-01-26 NOTE — Assessment & Plan Note (Signed)
Control is marginal If still over 8%--- will add glipizide

## 2016-01-26 NOTE — Progress Notes (Signed)
Pre visit review using our clinic review tool, if applicable. No additional management support is needed unless otherwise documented below in the visit note. 

## 2016-01-26 NOTE — Assessment & Plan Note (Signed)
This is suspicious but some inflammation Will try keflex for 5 days---if persists, it is time for dermatology

## 2016-02-17 ENCOUNTER — Other Ambulatory Visit: Payer: Self-pay | Admitting: Internal Medicine

## 2016-02-28 DIAGNOSIS — D485 Neoplasm of uncertain behavior of skin: Secondary | ICD-10-CM | POA: Diagnosis not present

## 2016-02-28 DIAGNOSIS — B078 Other viral warts: Secondary | ICD-10-CM | POA: Diagnosis not present

## 2016-03-06 DIAGNOSIS — G4733 Obstructive sleep apnea (adult) (pediatric): Secondary | ICD-10-CM | POA: Diagnosis not present

## 2016-05-20 DIAGNOSIS — G4733 Obstructive sleep apnea (adult) (pediatric): Secondary | ICD-10-CM | POA: Diagnosis not present

## 2016-06-03 ENCOUNTER — Encounter: Payer: Self-pay | Admitting: Internal Medicine

## 2016-06-04 ENCOUNTER — Telehealth: Payer: Self-pay

## 2016-06-04 DIAGNOSIS — Z125 Encounter for screening for malignant neoplasm of prostate: Secondary | ICD-10-CM

## 2016-06-04 DIAGNOSIS — E119 Type 2 diabetes mellitus without complications: Secondary | ICD-10-CM

## 2016-06-04 NOTE — Telephone Encounter (Signed)
Looks like he needs a Lipid Panel, also. The last one was Sept 2016.

## 2016-06-04 NOTE — Telephone Encounter (Signed)
Pt brought in [employer] forms that need to be signed. He also needs Nicotine Labs. I have advised the pt of this. Please put in orders. He had them done last year about this time, also.

## 2016-06-04 NOTE — Telephone Encounter (Signed)
PT dropped off forms to be filled out. Please call him when they are ready. Asked if they could be back by Friday and I told him we would try our best. Form placed in [facility].

## 2016-06-05 NOTE — Telephone Encounter (Signed)
Thank you. I have sent him a [employer]

## 2016-06-05 NOTE — Telephone Encounter (Signed)
Future orders placed. Let him know these will take the place of labs at his upcoming appointment

## 2016-06-06 ENCOUNTER — Other Ambulatory Visit (INDEPENDENT_AMBULATORY_CARE_PROVIDER_SITE_OTHER): Payer: BLUE CROSS/BLUE SHIELD

## 2016-06-06 DIAGNOSIS — E119 Type 2 diabetes mellitus without complications: Secondary | ICD-10-CM | POA: Diagnosis not present

## 2016-06-06 DIAGNOSIS — R7989 Other specified abnormal findings of blood chemistry: Secondary | ICD-10-CM | POA: Diagnosis not present

## 2016-06-06 DIAGNOSIS — Z125 Encounter for screening for malignant neoplasm of prostate: Secondary | ICD-10-CM

## 2016-06-06 LAB — PSA: PSA: 0.97 ng/mL (ref 0.10–4.00)

## 2016-06-06 LAB — LIPID PANEL
CHOL/HDL RATIO: 5
CHOLESTEROL: 226 mg/dL — AB (ref 0–200)
HDL: 43.3 mg/dL (ref >39.00)
NonHDL: 182.92
Triglycerides: 237 mg/dL — ABNORMAL HIGH (ref 0.0–149.0)
VLDL: 47.4 mg/dL — AB (ref 0.0–40.0)

## 2016-06-06 LAB — COMPREHENSIVE METABOLIC PANEL
ALBUMIN: 4.4 g/dL (ref 3.5–5.2)
ALK PHOS: 93 U/L (ref 39–117)
ALT: 93 U/L — ABNORMAL HIGH (ref 0–53)
AST: 65 U/L — ABNORMAL HIGH (ref 0–37)
BUN: 14 mg/dL (ref 6–23)
CO2: 31 mEq/L (ref 19–32)
Calcium: 9.5 mg/dL (ref 8.4–10.5)
Chloride: 100 mEq/L (ref 96–112)
Creatinine, Ser: 1.1 mg/dL (ref 0.40–1.50)
GFR: 74.69 mL/min (ref >60.00)
Glucose, Bld: 204 mg/dL — ABNORMAL HIGH (ref 70–99)
POTASSIUM: 4.2 meq/L (ref 3.5–5.1)
Sodium: 139 mEq/L (ref 135–145)
TOTAL PROTEIN: 6.9 g/dL (ref 6.0–8.3)
Total Bilirubin: 0.9 mg/dL (ref 0.2–1.2)

## 2016-06-06 LAB — CBC WITH DIFFERENTIAL/PLATELET
Basophils Absolute: 0.1 10*3/uL (ref 0.0–0.1)
Basophils Relative: 1 % (ref 0.0–3.0)
EOS PCT: 2 % (ref 0.0–5.0)
Eosinophils Absolute: 0.1 10*3/uL (ref 0.0–0.7)
HCT: 42.2 % (ref 39.0–52.0)
HEMOGLOBIN: 14.6 g/dL (ref 13.0–17.0)
Lymphocytes Relative: 23.6 % (ref 12.0–46.0)
Lymphs Abs: 1.3 10*3/uL (ref 0.7–4.0)
MCHC: 34.7 g/dL (ref 30.0–36.0)
MCV: 82.7 fl (ref 78.0–100.0)
MONO ABS: 0.4 10*3/uL (ref 0.1–1.0)
MONOS PCT: 6.6 % (ref 3.0–12.0)
Neutro Abs: 3.8 10*3/uL (ref 1.4–7.7)
Neutrophils Relative %: 66.8 % (ref 43.0–77.0)
Platelets: 157 10*3/uL (ref 150.0–400.0)
RBC: 5.1 Mil/uL (ref 4.22–5.81)
RDW: 14.1 % (ref 11.5–15.5)
WBC: 5.7 10*3/uL (ref 4.0–10.5)

## 2016-06-06 LAB — LDL CHOLESTEROL, DIRECT: Direct LDL: 154 mg/dL

## 2016-06-06 LAB — HEMOGLOBIN A1C: Hgb A1c MFr Bld: 7.8 % — ABNORMAL HIGH (ref 4.6–6.5)

## 2016-06-07 ENCOUNTER — Encounter: Payer: Self-pay | Admitting: Internal Medicine

## 2016-06-07 LAB — NICOTINE/COTININE METABOLITES: COTININE: NEGATIVE

## 2016-06-28 ENCOUNTER — Encounter: Payer: Self-pay | Admitting: Pulmonary Disease

## 2016-06-28 ENCOUNTER — Ambulatory Visit (INDEPENDENT_AMBULATORY_CARE_PROVIDER_SITE_OTHER): Payer: BLUE CROSS/BLUE SHIELD | Admitting: Adult Health

## 2016-06-28 ENCOUNTER — Encounter: Payer: Self-pay | Admitting: Adult Health

## 2016-06-28 DIAGNOSIS — J3089 Other allergic rhinitis: Secondary | ICD-10-CM | POA: Diagnosis not present

## 2016-06-28 DIAGNOSIS — G4733 Obstructive sleep apnea (adult) (pediatric): Secondary | ICD-10-CM

## 2016-06-28 MED ORDER — AZITHROMYCIN 250 MG PO TABS: ORAL_TABLET | ORAL | 0 refills | Status: AC

## 2016-06-28 NOTE — Progress Notes (Signed)
Subjective:    Patient ID: Charles Phelps, male    DOB: Jan 25, 1966, 50 y.o.   MRN: [id number]  HPI 50 yo male followed for severe OSA on CPAP At bedtime  .   TEST  NPSG 2005:  AHI 86/hr.  06/28/16  Acute OV : OSA /AR  Pt presents for an acute office visit . Complains that nasal drainage and stuffy nose. Says he feels CPAP machine is not working last 2 night . Does feel pressure coming thru but when he put the CPAP mask on it he feel he is not getting adequate pressure. He wears CPAP every night and does not like to be without it. Machine is less <2 yrs old.  Download shows AHI 1.2 , execellent compliance with 100% usage at 8 hr each night and no leaks. +  He denies fever, chest pain, orthopnea, discolored mucus .does have clear nasal drainage.    Past Medical History:  Diagnosis Date  . Allergic rhinitis   . Anxiety   . Elevated LFTs 2000   fatty liver  . GERD (gastroesophageal reflux disease)   . Glucose intolerance (impaired glucose tolerance)   . Hyperlipidemia   . Hypertension   . IBS (irritable bowel syndrome)   . Obstructive sleep apnea   . Osteoarthritis    Current Outpatient Prescriptions on File Prior to Visit  Medication Sig Dispense Refill  . aspirin EC 81 MG tablet Take 81 mg by mouth every other day.    . cetirizine (ZYRTEC) 10 MG tablet Take 10 mg by mouth daily.      . citalopram (CELEXA) 20 MG tablet TAKE 1 TABLET DAILY 90 tablet 1  . hyoscyamine (LEVBID) 0.375 MG 12 hr tablet TAKE 1 TABLET TWICE A DAY AS NEEDED 180 tablet 3  . lisinopril (PRINIVIL,ZESTRIL) 20 MG tablet TAKE 1 TABLET DAILY 90 tablet 3  . metFORMIN (GLUCOPHAGE) 1000 MG tablet TAKE 1 TABLET TWICE A DAY WITH MEALS 180 tablet 3  . Multiple Vitamin (ONE-A-DAY MENS) TABS Take by mouth.      Marland Kitchen omeprazole (PRILOSEC) 20 MG capsule TAKE 1 CAPSULE DAILY 90 capsule 3  . ONE TOUCH ULTRA TEST test strip USE AS INSTRUCTED TO CHECK BLOOD SUGAR ONCE DAILY 100 each 2  . ONETOUCH DELICA LANCETS FINE MISC  USE TO CHECK BLOOD SUGAR ONCE DAILY 200 each 3  . metFORMIN (GLUCOPHAGE) 500 MG tablet Take by mouth 2 (two) times daily with a meal.     No current facility-administered medications on file prior to visit.      Review of Systems Constitutional:   No  weight loss, night sweats,  Fevers, chills, fatigue, or  lassitude.  HEENT:   No headaches,  Difficulty swallowing,  Tooth/dental problems, or  Sore throat,                No sneezing, itching, ear ache,  +nasal congestion, post nasal drip,   CV:  No chest pain,  Orthopnea, PND, swelling in lower extremities, anasarca, dizziness, palpitations, syncope.   GI  No heartburn, indigestion, abdominal pain, nausea, vomiting, diarrhea, change in bowel habits, loss of appetite, bloody stools.   Resp: .  No chest wall deformity  Skin: no rash or lesions.  GU: no dysuria, change in color of urine, no urgency or frequency.  No flank pain, no hematuria   MS:  No joint pain or swelling.  No decreased range of motion.  No back pain.  Psych:  No change in mood  or affect. No depression or anxiety.  No memory loss.         Objective:   Physical Exam Vitals:   06/28/16 1507  Pulse: 78  Temp: 97.7 F (36.5 C)  TempSrc: Oral  SpO2: 98%  Weight: 292 lb 12.8 oz (132.8 kg)  Height: 6\' 3"  (1.905 m)   GEN: A/Ox3; pleasant , NAD, well nourished    HEENT:  [place]/AT,  EACs-clear, TMs-wnl, NOSE-clear drainage , THROAT-clear, no lesions, no postnasal drip or exudate noted.  Class 2-3 MP airway   NECK:  Supple w/ fair ROM; no JVD; normal carotid impulses w/o bruits; no thyromegaly or nodules palpated; no lymphadenopathy.    RESP  Clear  P & A; w/o, wheezes/ rales/ or rhonchi. no accessory muscle use, no dullness to percussion  CARD:  RRR, no m/r/g  , no peripheral edema, pulses intact, no cyanosis or clubbing.  GI:   Soft & nt; nml bowel sounds; no organomegaly or masses detected.   Musco: Warm bil, no deformities or joint swelling noted.    Neuro: alert, no focal deficits noted.    Skin: Warm, no lesions or rashes  Yaqueline Gutter NP-C  [facility]  06/28/16       Assessment & Plan:

## 2016-06-28 NOTE — Telephone Encounter (Signed)
Pt calling back [phone]

## 2016-06-28 NOTE — Patient Instructions (Signed)
Saline nasal spray As needed   Afrin 2 puffs Twice daily  X 3 days  Mucinex DM Twice daily  As needed  Cough/congestion  Claritin 10mg  daily As needed  Drainage.  Zpack to have on hold if symptoms worsen with discolored mucus .  Continue on CPAP At bedtime  -if still not working right take by DME company to have checked.  Please contact office for sooner follow up if symptoms do not improve or worsen or seek emergency care

## 2016-06-28 NOTE — Telephone Encounter (Signed)
Pt. Has an acute visit with TP this afternoon, Tammy Davis approved me scheduling the pt.

## 2016-07-01 NOTE — Assessment & Plan Note (Signed)
Good control and compliance of CPAP .  Suspect nasal congestion /Rhinitis flare is contributing to recent CPAP intolerance however if continues after Rhinitis tx , will need to take CPAP to DME for service check.   Plan  Patient Instructions  Saline nasal spray As needed   Afrin 2 puffs Twice daily  X 3 days  Mucinex DM Twice daily  As needed  Cough/congestion  Claritin 10mg  daily As needed  Drainage.  Zpack to have on hold if symptoms worsen with discolored mucus .  Continue on CPAP At bedtime  -if still not working right take by DME company to have checked.  Please contact office for sooner follow up if symptoms do not improve or worsen or seek emergency care

## 2016-07-01 NOTE — Assessment & Plan Note (Signed)
AR/URI suspect this is viral in nature   Plan  Patient Instructions  Saline nasal spray As needed   Afrin 2 puffs Twice daily  X 3 days  Mucinex DM Twice daily  As needed  Cough/congestion  Claritin 10mg  daily As needed  Drainage.  Zpack to have on hold if symptoms worsen with discolored mucus .  Continue on CPAP At bedtime  -if still not working right take by DME company to have checked.  Please contact office for sooner follow up if symptoms do not improve or worsen or seek emergency care

## 2016-07-15 ENCOUNTER — Other Ambulatory Visit: Payer: Self-pay | Admitting: Internal Medicine

## 2016-07-26 ENCOUNTER — Encounter: Payer: BLUE CROSS/BLUE SHIELD | Admitting: Internal Medicine

## 2016-08-05 DIAGNOSIS — G4733 Obstructive sleep apnea (adult) (pediatric): Secondary | ICD-10-CM | POA: Diagnosis not present

## 2016-08-17 ENCOUNTER — Other Ambulatory Visit: Payer: Self-pay | Admitting: Internal Medicine

## 2016-09-02 DIAGNOSIS — L905 Scar conditions and fibrosis of skin: Secondary | ICD-10-CM | POA: Diagnosis not present

## 2016-09-02 DIAGNOSIS — L821 Other seborrheic keratosis: Secondary | ICD-10-CM | POA: Diagnosis not present

## 2016-09-02 DIAGNOSIS — L281 Prurigo nodularis: Secondary | ICD-10-CM | POA: Diagnosis not present

## 2016-09-02 DIAGNOSIS — D485 Neoplasm of uncertain behavior of skin: Secondary | ICD-10-CM | POA: Diagnosis not present

## 2016-09-06 DIAGNOSIS — E119 Type 2 diabetes mellitus without complications: Secondary | ICD-10-CM | POA: Diagnosis not present

## 2016-09-21 ENCOUNTER — Other Ambulatory Visit: Payer: Self-pay | Admitting: Internal Medicine

## 2016-09-24 ENCOUNTER — Ambulatory Visit (INDEPENDENT_AMBULATORY_CARE_PROVIDER_SITE_OTHER): Payer: BLUE CROSS/BLUE SHIELD | Admitting: Internal Medicine

## 2016-09-24 ENCOUNTER — Encounter: Payer: Self-pay | Admitting: Internal Medicine

## 2016-09-24 VITALS — BP 136/84 | HR 79 | Temp 97.9°F | Wt 298.0 lb

## 2016-09-24 DIAGNOSIS — R109 Unspecified abdominal pain: Secondary | ICD-10-CM

## 2016-09-24 DIAGNOSIS — M549 Dorsalgia, unspecified: Secondary | ICD-10-CM | POA: Diagnosis not present

## 2016-09-24 LAB — POC URINALSYSI DIPSTICK (AUTOMATED)
BILIRUBIN UA: NEGATIVE
Glucose, UA: NEGATIVE
KETONES UA: NEGATIVE
Leukocytes, UA: NEGATIVE
NITRITE UA: NEGATIVE
PH UA: 6
RBC UA: NEGATIVE
Urobilinogen, UA: NEGATIVE

## 2016-09-24 NOTE — Progress Notes (Signed)
Subjective:    Patient ID: Charles Phelps, male    DOB: [birth year], 51 y.o.   MRN: [id number]  HPI  Pt presents to the clinic today with c/o bilateral flank pain. This started 4 days ago. The pain seems worse with sitting and bending over. He denies urgency frequency or dysuria. He reports his wife tells him that he has a urine odor. He originally thought this might be muscular from playing golf but denies any injury to the area. He has not take anything OTC for this.   Review of Systems      Past Medical History:  Diagnosis Date  . Allergic rhinitis   . Anxiety   . Elevated LFTs 2000   fatty liver  . GERD (gastroesophageal reflux disease)   . Glucose intolerance (impaired glucose tolerance)   . Hyperlipidemia   . Hypertension   . IBS (irritable bowel syndrome)   . Obstructive sleep apnea   . Osteoarthritis     Current Outpatient Prescriptions  Medication Sig Dispense Refill  . aspirin EC 81 MG tablet Take 81 mg by mouth every other day.    . cetirizine (ZYRTEC) 10 MG tablet Take 10 mg by mouth daily.      . citalopram (CELEXA) 20 MG tablet TAKE 1 TABLET DAILY 90 tablet 1  . hyoscyamine (LEVBID) 0.375 MG 12 hr tablet TAKE 1 TABLET TWICE A DAY AS NEEDED 180 tablet 1  . lisinopril (PRINIVIL,ZESTRIL) 20 MG tablet TAKE 1 TABLET DAILY 90 tablet 1  . metFORMIN (GLUCOPHAGE) 1000 MG tablet TAKE 1 TABLET TWICE A DAY WITH MEALS 180 tablet 3  . metFORMIN (GLUCOPHAGE) 500 MG tablet Take by mouth 2 (two) times daily with a meal.    . Multiple Vitamin (ONE-A-DAY MENS) TABS Take by mouth.      Marland Kitchen omeprazole (PRILOSEC) 20 MG capsule TAKE 1 CAPSULE DAILY 90 capsule 1  . ONE TOUCH ULTRA TEST test strip USE AS INSTRUCTED TO CHECK BLOOD SUGAR ONCE DAILY 100 each 3  . ONETOUCH DELICA LANCETS FINE MISC USE TO CHECK BLOOD SUGAR ONCE DAILY 200 each 3   No current facility-administered medications for this visit.     Allergies  Allergen Reactions  . Atorvastatin Other (See Comments)   Memory problems    Family History  Problem Relation Age of Onset  . Hypertension Mother   . Diabetes Mother   . Heart disease Father     CABG at 3  . Cancer Father     lung  . Diabetes Sister   . Diabetes Brother     Social History   Social History  . Marital status: Married    Spouse name: N/A  . Number of children: 1  . Years of education: N/A   Occupational History  . Industrial / Occupational hygienist   Social History Main Topics  . Smoking status: Never Smoker  . Smokeless tobacco: Current User  . Alcohol use Yes     Comment: Occasionally  . Drug use: No  . Sexual activity: Not on file   Other Topics Concern  . Not on file   Social History Narrative  . No narrative on file     Constitutional: Denies fever, malaise, fatigue, headache or abrupt weight changes.  Gastrointestinal: Denies abdominal pain, bloating, constipation, diarrhea or blood in the stool.  GU: Pt reports bilateral flank pain. Denies urgency, frequency, pain with urination, burning sensation, blood in urine, odor or discharge. Musculoskeletal: Denies decrease in range  of motion, difficulty with gait, muscle pain or joint pain and swelling.    No other specific complaints in a complete review of systems (except as listed in HPI above).  Objective:   Physical Exam   BP 136/84   Pulse 79   Temp 97.9 F (36.6 C) (Oral)   Wt 298 lb (135.2 kg)   SpO2 98%   BMI 37.25 kg/m  Wt Readings from Last 3 Encounters:  09/24/16 298 lb (135.2 kg)  06/28/16 292 lb 12.8 oz (132.8 kg)  01/26/16 293 lb (132.9 kg)    General: Appears his stated age, obese in NAD. Abdomen: Soft and nontender. No CVA tenderness noted. Musculoskeletal: Pain with flexion and rotation. Normal extension. No bony tenderness noted over the spine.   BMET    Component Value Date/Time   NA 139 06/06/2016 0849   K 4.2 06/06/2016 0849   CL 100 06/06/2016 0849   CO2 31 06/06/2016 0849   GLUCOSE 204 (H) 06/06/2016  0849   BUN 14 06/06/2016 0849   CREATININE 1.10 06/06/2016 0849   CALCIUM 9.5 06/06/2016 0849   GFRNONAA 69.44 10/27/2009 0852   GFRAA 85 02/23/2007 1151    Lipid Panel     Component Value Date/Time   CHOL 226 (H) 06/06/2016 0849   TRIG 237.0 (H) 06/06/2016 0849   HDL 43.30 06/06/2016 0849   CHOLHDL 5 06/06/2016 0849   VLDL 47.4 (H) 06/06/2016 0849   LDLCALC 58 08/12/2013 0832    CBC    Component Value Date/Time   WBC 5.7 06/06/2016 0849   RBC 5.10 06/06/2016 0849   HGB 14.6 06/06/2016 0849   HCT 42.2 06/06/2016 0849   PLT 157.0 06/06/2016 0849   MCV 82.7 06/06/2016 0849   MCHC 34.7 06/06/2016 0849   RDW 14.1 06/06/2016 0849   LYMPHSABS 1.3 06/06/2016 0849   MONOABS 0.4 06/06/2016 0849   EOSABS 0.1 06/06/2016 0849   BASOSABS 0.1 06/06/2016 0849    Hgb A1C Lab Results  Component Value Date   HGBA1C 7.8 (H) 06/06/2016           Assessment & Plan:   Musculoskeletal back pain:  Urinalysis: normal Will send urine culture Advised him to try Ibuprofen 600-800 mg every 8 hours prn Stretching exercises given Heat and massage may be helpful  RTC as needed or if symptoms persist or worsen Alya Smaltz, NP

## 2016-09-24 NOTE — Patient Instructions (Signed)

## 2016-09-24 NOTE — Addendum Note (Signed)
Addended by: Lurlean Nanny on: 09/24/2016 03:59 PM   Modules accepted: Orders

## 2016-09-25 LAB — URINE CULTURE: ORGANISM ID, BACTERIA: NO GROWTH

## 2016-09-29 ENCOUNTER — Encounter: Payer: Self-pay | Admitting: Pulmonary Disease

## 2016-09-30 ENCOUNTER — Encounter: Payer: Self-pay | Admitting: Pulmonary Disease

## 2016-09-30 ENCOUNTER — Ambulatory Visit (INDEPENDENT_AMBULATORY_CARE_PROVIDER_SITE_OTHER): Payer: BLUE CROSS/BLUE SHIELD | Admitting: Pulmonary Disease

## 2016-09-30 DIAGNOSIS — G4733 Obstructive sleep apnea (adult) (pediatric): Secondary | ICD-10-CM | POA: Diagnosis not present

## 2016-09-30 DIAGNOSIS — J301 Allergic rhinitis due to pollen: Secondary | ICD-10-CM

## 2016-09-30 NOTE — Addendum Note (Signed)
Addended by: Valerie Salts on: 09/30/2016 12:16 PM   Modules accepted: Orders

## 2016-09-30 NOTE — Progress Notes (Signed)
   Subjective:    Patient ID: Charles Phelps, male    DOB: April 22, 1966, 51 y.o.   MRN: [id number]  HPI  Annual FU Of OSA Received new CPAP 08/2014  He is maintained on auto CPAP with nasal pillows and reports good compliance with this. He had good improvement in his daytime somnolence and fatigue His weight has been unchanged. He had a nasal wart removed and this recurred and he wonders if nasal pillows may have caused irritation. He sleeps on his belly and prefers nasal pillows and is resistant to changing to a different from the mask interface   He denies any change in his sleep habits, no snoring has been noted by wife, the daytime somnolence or fatigue  Download was reviewed which shows good control of events with average pressure of 12 cm, he spent an auto settings 5-20 cm, excellent compliance  NPSG 2005:  AHI 86/hr.  Review of Systems Patient denies significant dyspnea,cough, hemoptysis,  chest pain, palpitations, pedal edema, orthopnea, paroxysmal nocturnal dyspnea, lightheadedness, nausea, vomiting, abdominal or  leg pains      Objective:   Physical Exam   Gen. Pleasant, well-nourished, in no distress ENT - excised skin on nostril rt, no post nasal drip Neck: No JVD, no thyromegaly, no carotid bruits Lungs: no use of accessory muscles, no dullness to percussion, clear without rales or rhonchi  Cardiovascular: Rhythm regular, heart sounds  normal, no murmurs or gallops, no peripheral edema Musculoskeletal: No deformities, no cyanosis or clubbing         Assessment & Plan:

## 2016-09-30 NOTE — Patient Instructions (Signed)
We will change her auto settings to 5-13 cm Prescription will be sent to [facility]  Call us if you were to switch to a nasal mask-we can also send you for mask fitting session of required

## 2016-09-30 NOTE — Assessment & Plan Note (Signed)
Ct [facility]

## 2016-09-30 NOTE — Assessment & Plan Note (Signed)
We will change her auto settings to 5-13 cm Prescription will be sent to [facility]  Call us if you were to switch to a nasal mask-we can also send you for mask fitting session of required  Weight loss encouraged, compliance with goal of at least 4-6 hrs every night is the expectation. Advised against medications with sedative side effects Cautioned against driving when sleepy - understanding that sleepiness will vary on a day to day basis

## 2016-10-14 DIAGNOSIS — L281 Prurigo nodularis: Secondary | ICD-10-CM | POA: Diagnosis not present

## 2016-11-04 DIAGNOSIS — G4733 Obstructive sleep apnea (adult) (pediatric): Secondary | ICD-10-CM | POA: Diagnosis not present

## 2016-11-11 ENCOUNTER — Encounter: Payer: BLUE CROSS/BLUE SHIELD | Admitting: Internal Medicine

## 2016-11-23 ENCOUNTER — Emergency Department (HOSPITAL_COMMUNITY)
Admission: EM | Admit: 2016-11-23 | Discharge: 2016-11-24 | Disposition: A | Discharge status: Home / Self Care | Payer: BLUE CROSS/BLUE SHIELD | Attending: Emergency Medicine | Admitting: Emergency Medicine

## 2016-11-23 DIAGNOSIS — Z79899 Other long term (current) drug therapy: Secondary | ICD-10-CM | POA: Insufficient documentation

## 2016-11-23 DIAGNOSIS — R000 Tachycardia, unspecified: Secondary | ICD-10-CM | POA: Diagnosis not present

## 2016-11-23 DIAGNOSIS — I4891 Unspecified atrial fibrillation: Secondary | ICD-10-CM

## 2016-11-23 DIAGNOSIS — Z7982 Long term (current) use of aspirin: Secondary | ICD-10-CM | POA: Insufficient documentation

## 2016-11-23 DIAGNOSIS — I10 Essential (primary) hypertension: Secondary | ICD-10-CM | POA: Diagnosis not present

## 2016-11-23 DIAGNOSIS — Z7901 Long term (current) use of anticoagulants: Secondary | ICD-10-CM | POA: Diagnosis not present

## 2016-11-23 DIAGNOSIS — R079 Chest pain, unspecified: Secondary | ICD-10-CM | POA: Diagnosis not present

## 2016-11-23 NOTE — ED Triage Notes (Signed)
The pt arrived by gems from home he played golf all day today and was drinking beer while playing  He was at home around 2230 and he had a pain in his chest anterior and posterior and his heart was beating real fast.  He went to the fire station and he was found to be in af.  Ems was called  Iv started and with a val salva maneuver he converted to a nsr.  At present he has no chest pain  nsr on the monitor.  Alert oriented skin warm and dry

## 2016-11-24 ENCOUNTER — Emergency Department (HOSPITAL_COMMUNITY): Payer: BLUE CROSS/BLUE SHIELD

## 2016-11-24 ENCOUNTER — Encounter (HOSPITAL_COMMUNITY): Payer: Self-pay | Admitting: *Deleted

## 2016-11-24 DIAGNOSIS — R079 Chest pain, unspecified: Secondary | ICD-10-CM | POA: Diagnosis not present

## 2016-11-24 LAB — BASIC METABOLIC PANEL
Anion gap: 13 (ref 5–15)
BUN: 15 mg/dL (ref 6–20)
CHLORIDE: 103 mmol/L (ref 101–111)
CO2: 23 mmol/L (ref 22–32)
CREATININE: 1.3 mg/dL — AB (ref 0.61–1.24)
Calcium: 9.3 mg/dL (ref 8.9–10.3)
GFR calc Af Amer: >60 mL/min (ref >60)
GFR calc non Af Amer: >60 mL/min (ref >60)
Glucose, Bld: 334 mg/dL — ABNORMAL HIGH (ref 65–99)
POTASSIUM: 3.6 mmol/L (ref 3.5–5.1)
Sodium: 139 mmol/L (ref 135–145)

## 2016-11-24 LAB — CBC
HEMATOCRIT: 40.9 % (ref 39.0–52.0)
Hemoglobin: 14.4 g/dL (ref 13.0–17.0)
MCH: 28.5 pg (ref 26.0–34.0)
MCHC: 35.2 g/dL (ref 30.0–36.0)
MCV: 81 fL (ref 78.0–100.0)
PLATELETS: 168 10*3/uL (ref 150–400)
RBC: 5.05 MIL/uL (ref 4.22–5.81)
RDW: 14 % (ref 11.5–15.5)
WBC: 7.3 10*3/uL (ref 4.0–10.5)

## 2016-11-24 LAB — CBG MONITORING, ED: Glucose-Capillary: 323 mg/dL — ABNORMAL HIGH (ref 65–99)

## 2016-11-24 LAB — MAGNESIUM: Magnesium: 2 mg/dL (ref 1.7–2.4)

## 2016-11-24 MED ORDER — RIVAROXABAN 20 MG PO TABS: 20.0000 mg | ORAL_TABLET | Freq: Every day | ORAL | 0 refills | Status: DC

## 2016-11-24 MED ORDER — RIVAROXABAN 20 MG PO TABS
20.0000 mg | ORAL_TABLET | Freq: Once | ORAL | Status: AC
  Administered 2016-11-24: 20 mg via ORAL
  Filled 2016-11-24: qty 1

## 2016-11-24 MED ORDER — SODIUM CHLORIDE 0.9 % IV BOLUS (SEPSIS)
1000.0000 mL | Freq: Once | INTRAVENOUS | Status: AC
  Administered 2016-11-24: 1000 mL via INTRAVENOUS

## 2016-11-24 NOTE — ED Provider Notes (Signed)
[facility] DEPT Provider Note   CSN: [id number] Arrival date & time: 11/23/16  2358  By signing my name below, I, Sherilynn Knight and Hansel Feinstein, attest that this documentation has been prepared under the direction and in the presence of Everlene Balls, MD. Electronically Signed: Marcello Moores and Hansel Feinstein, ED Scribe. 11/24/16. 1:27 AM.   History   Chief Complaint Chief Complaint  Patient presents with  . Tachycardia     The history is provided by the patient and the spouse. No language interpreter was used.   HPI Comments: Charles Phelps is a 51 y.o. male, BIB EMS, who presents to the Emergency Department complaining of palpitations that began at 10:30pm yesterday evening and resolved within 10 minutes. Pt reports that he was watching TV, and suddenly felt like he got a cramp in his chest and back that was accompanied by the palpitations. Per pt, wife reports high blood pressure (via blood pressure monitor) and fast heart beat during the episode. Pt states he went to the fire department for evaluation, who told him he was in Afib, and his symptoms resolved after valsalva maneuver. Per wife, pt reports playing golf earlier that day and drinking "a few" beers as well as caffeinated beverages at dinner.  Currently he reports feeling normal. Pt additionally complains of increased frequency. Pt has a PMHx of HTN and DM and a FMHx heart problems on his father's side. Pt denies PMHx of atrial fibrillation, MI and blood clots. Pt denies SOB, difficulty breathing, vomiting, diaphoresis.   Past Medical History:  Diagnosis Date  . Allergic rhinitis   . Anxiety   . Elevated LFTs 2000   fatty liver  . GERD (gastroesophageal reflux disease)   . Glucose intolerance (impaired glucose tolerance)   . Hyperlipidemia   . Hypertension   . IBS (irritable bowel syndrome)   . Obstructive sleep apnea   . Osteoarthritis     Patient Active Problem List   Diagnosis Date Noted  . Nasal lesion  01/26/2016  . OSA (obstructive sleep apnea) 08/29/2014  . Gout, unspecified 09/03/2011  . Diabetes mellitus type 2, controlled, without complications ([place]) 78/29/5621  . Routine general medical examination at a health care facility 02/01/2011  . IBS 02/27/2009  . Essential hypertension, benign 01/27/2009  . OSTEOARTHRITIS 02/21/2007  . Hyperlipemia 02/17/2007  . Mood disorder ([place]) 02/17/2007  . Allergic rhinitis 02/17/2007  . GERD 02/17/2007  . ARTHRITIS, LEFT HIP 02/17/2007    History reviewed. No pertinent surgical history.     Home Medications    Prior to Admission medications   Medication Sig Start Date End Date Taking? Authorizing Provider  aspirin EC 81 MG tablet Take 81 mg by mouth every other day.    Historical Provider, MD  cetirizine (ZYRTEC) 10 MG tablet Take 10 mg by mouth daily.      Historical Provider, MD  citalopram (CELEXA) 20 MG tablet TAKE 1 TABLET DAILY 08/19/16   Venia Carbon, MD  hyoscyamine (LEVBID) 0.375 MG 12 hr tablet TAKE 1 TABLET TWICE A DAY AS NEEDED 08/19/16   Venia Carbon, MD  lisinopril (PRINIVIL,ZESTRIL) 20 MG tablet TAKE 1 TABLET DAILY 08/19/16   Venia Carbon, MD  metFORMIN (GLUCOPHAGE) 1000 MG tablet TAKE 1 TABLET TWICE A DAY WITH MEALS 07/15/16   Venia Carbon, MD  metFORMIN (GLUCOPHAGE) 500 MG tablet Take by mouth 2 (two) times daily with a meal.    Historical Provider, MD  Multiple Vitamin (ONE-A-DAY MENS) TABS Take by  mouth.      Historical Provider, MD  omeprazole (PRILOSEC) 20 MG capsule TAKE 1 CAPSULE DAILY 08/19/16   Venia Carbon, MD  ONE [facility] ULTRA TEST test strip USE AS INSTRUCTED TO CHECK BLOOD SUGAR ONCE DAILY 09/23/16   Venia Carbon, MD  [facility] DELICA LANCETS FINE MISC USE TO CHECK BLOOD SUGAR ONCE DAILY 12/26/15   Venia Carbon, MD  rivaroxaban (XARELTO) 20 MG TABS tablet Take 1 tablet (20 mg total) by mouth daily with supper. 11/24/16   Everlene Balls, MD    Family History Family History  Problem Relation  Age of Onset  . Hypertension Mother   . Diabetes Mother   . Heart disease Father     CABG at 61  . Cancer Father     lung  . Diabetes Sister   . Diabetes Brother     Social History Social History  Substance Use Topics  . Smoking status: Never Smoker  . Smokeless tobacco: Current User  . Alcohol use Yes     Comment: Occasionally     Allergies   Atorvastatin   Review of Systems Review of Systems  10 Systems reviewed and are negative for acute change except as noted in the HPI.  Physical Exam Updated Vital Signs BP (!) 174/92   Pulse 72   Temp 98.3 F (36.8 C) (Oral)   Resp 17   Ht 6\' 3"  (1.905 m)   Wt 295 lb (133.8 kg)   SpO2 99%   BMI 36.87 kg/m   Physical Exam  Constitutional: He is oriented to person, place, and time. Vital signs are normal. He appears well-developed and well-nourished.  Non-toxic appearance. He does not appear ill. No distress.  HENT:  Head: Normocephalic and atraumatic.  Nose: Nose normal.  Mouth/Throat: Oropharynx is clear and moist. No oropharyngeal exudate.  Eyes: Conjunctivae and EOM are normal. Pupils are equal, round, and reactive to light. No scleral icterus.  Neck: Normal range of motion. Neck supple. No tracheal deviation, no edema, no erythema and normal range of motion present. No thyroid mass and no thyromegaly present.  Cardiovascular: Normal rate, regular rhythm, S1 normal, S2 normal, normal heart sounds, intact distal pulses and normal pulses.  Exam reveals no gallop and no friction rub.   No murmur heard. Pulmonary/Chest: Effort normal and breath sounds normal. No respiratory distress. He has no wheezes. He has no rhonchi. He has no rales.  Abdominal: Soft. Normal appearance and bowel sounds are normal. He exhibits no distension, no ascites and no mass. There is no hepatosplenomegaly. There is no tenderness. There is no rebound, no guarding and no CVA tenderness.  Musculoskeletal: Normal range of motion. He exhibits no edema or  tenderness.  Lymphadenopathy:    He has no cervical adenopathy.  Neurological: He is alert and oriented to person, place, and time. He has normal strength. No cranial nerve deficit or sensory deficit.  Skin: Skin is warm, dry and intact. No petechiae and no rash noted. He is not diaphoretic. No erythema. No pallor.  Nursing note and vitals reviewed.    ED Treatments / Results  DIAGNOSTIC STUDIES: Oxygen Saturation is 97% on RA, normal by my interpretation.   COORDINATION OF CARE: 1:15 AM-Discussed next steps with pt. Pt verbalized understanding and is agreeable with the plan.   Labs (all labs ordered are listed, but only abnormal results are displayed) Labs Reviewed  BASIC METABOLIC PANEL - Abnormal; Notable for the following:  Result Value   Glucose, Bld 334 (*)    Creatinine, Ser 1.30 (*)    All other components within normal limits  CBG MONITORING, ED - Abnormal; Notable for the following:    Glucose-Capillary 323 (*)    All other components within normal limits  CBC  MAGNESIUM    EKG  EKG Interpretation  Date/Time:  Saturday November 23 2016 23:58:21 EDT Ventricular Rate:  94 PR Interval:    QRS Duration: 95 QT Interval:  366 QTC Calculation: 458 R Axis:   47 Text Interpretation:  Sinus rhythm No old tracing to compare Confirmed by Glynn Octave [phone]) on 11/24/2016 12:05:09 AM       Radiology Dg Chest 2 View  Result Date: 11/24/2016 CLINICAL DATA:  51 year old male with chest pain. EXAM: CHEST  2 VIEW COMPARISON:  Chest CT dated 07/20/2014 FINDINGS: The heart size and mediastinal contours are within normal limits. Both lungs are clear. The visualized skeletal structures are unremarkable. IMPRESSION: No active cardiopulmonary disease. Electronically Signed   By: Anner Crete M.D.   On: 11/24/2016 01:31    Procedures Procedures (including critical care time)  Medications Ordered in ED Medications  rivaroxaban (XARELTO) tablet 20 mg (not  administered)  sodium chloride 0.9 % bolus 1,000 mL (0 mLs Intravenous Stopped 11/24/16 0222)     Initial Impression / Assessment and Plan / ED Course  I have reviewed the triage vital signs and the nursing notes.  Pertinent labs & imaging results that were available during my care of the patient were reviewed by me and considered in my medical decision making (see chart for details).     Patient presents to the ED for palpitations and irreg HR.  EMS EKG reveals a fib.  It has since resolved and he feels back to normal.  Per our cone protocol, patient will require anticoag.  Will give xarelto and Rx.  Labs are unremarkable. He was advised to stop his aspirin until cardiology follow up. HE appears well and in NAD. VS remain within his normal limits and he is safe for DC.  Final Clinical Impressions(s) / ED Diagnoses   Final diagnoses:  Atrial fibrillation with RVR (HCC)    New Prescriptions New Prescriptions   RIVAROXABAN (XARELTO) 20 MG TABS TABLET    Take 1 tablet (20 mg total) by mouth daily with supper.     I personally performed the services described in this documentation, which was scribed in my presence. The recorded information has been reviewed and is accurate.       Everlene Balls, MD 11/24/16 [phone]

## 2016-11-24 NOTE — ED Notes (Signed)
Patient transported to X-ray 

## 2016-11-24 NOTE — ED Notes (Signed)
Delay in lab draw,  Pt in xray. 

## 2016-11-25 ENCOUNTER — Telehealth (HOSPITAL_COMMUNITY): Payer: Self-pay | Admitting: *Deleted

## 2016-11-25 NOTE — Telephone Encounter (Signed)
Pt in ED over the weekend with afib.  LMOM for pt to clbk to sched appt for follow up.  LM on both cell and home.

## 2016-11-26 ENCOUNTER — Telehealth: Payer: Self-pay

## 2016-11-26 NOTE — Telephone Encounter (Signed)
Lm on pts vm requesting a call back  ----- Message -----  From: Pilar Grammes, CMA  Sent: 11/26/2016  7:55 AM  To: Modena Nunnery, CMA        ----- Message -----  From: Venia Carbon, MD  Sent: 11/25/2016  5:35 PM  To: Pilar Grammes, CMA    Please check on him  He needs appt---I can add on if needed at 12 Tuesday  He will also need cardiology --but I don't know if they will get him in as fast  ----- Message -----  From: Tori Milks, RN  Sent: 11/24/2016  3:16 AM  To: Venia Carbon, MD

## 2016-11-27 DIAGNOSIS — I10 Essential (primary) hypertension: Secondary | ICD-10-CM | POA: Diagnosis not present

## 2016-11-27 DIAGNOSIS — E119 Type 2 diabetes mellitus without complications: Secondary | ICD-10-CM | POA: Diagnosis not present

## 2016-11-27 DIAGNOSIS — R220 Localized swelling, mass and lump, head: Secondary | ICD-10-CM | POA: Insufficient documentation

## 2016-11-27 DIAGNOSIS — R221 Localized swelling, mass and lump, neck: Secondary | ICD-10-CM | POA: Diagnosis not present

## 2016-11-27 DIAGNOSIS — Z79899 Other long term (current) drug therapy: Secondary | ICD-10-CM | POA: Diagnosis not present

## 2016-11-27 NOTE — ED Triage Notes (Signed)
Pt states he noticed swelling to left cheek face area; pt states wife thinks he is having allergic reaction to xrelto; pt just started med on Sunday; Pt was dx with Afib on Saturday and placed on xrelto; pt states hx of HTN; pt denies pain on arrival. Pt a&ox 4 on arrival. Pt denies any SOB at triage.

## 2016-11-28 ENCOUNTER — Encounter (HOSPITAL_COMMUNITY): Payer: Self-pay | Admitting: Nurse Practitioner

## 2016-11-28 ENCOUNTER — Ambulatory Visit (HOSPITAL_COMMUNITY)
Admission: RE | Admit: 2016-11-28 | Discharge: 2016-11-28 | Disposition: A | Discharge status: Home / Self Care | Payer: BLUE CROSS/BLUE SHIELD | Source: Ambulatory Visit | Attending: Nurse Practitioner | Admitting: Nurse Practitioner

## 2016-11-28 ENCOUNTER — Emergency Department (HOSPITAL_COMMUNITY)
Admission: EM | Admit: 2016-11-28 | Discharge: 2016-11-28 | Disposition: A | Discharge status: Home / Self Care | Payer: BLUE CROSS/BLUE SHIELD | Attending: Emergency Medicine | Admitting: Emergency Medicine

## 2016-11-28 VITALS — BP 154/102 | HR 76 | Ht 75.0 in | Wt 288.0 lb

## 2016-11-28 DIAGNOSIS — Z7984 Long term (current) use of oral hypoglycemic drugs: Secondary | ICD-10-CM | POA: Diagnosis not present

## 2016-11-28 DIAGNOSIS — Z79899 Other long term (current) drug therapy: Secondary | ICD-10-CM | POA: Diagnosis not present

## 2016-11-28 DIAGNOSIS — I480 Paroxysmal atrial fibrillation: Secondary | ICD-10-CM

## 2016-11-28 DIAGNOSIS — I10 Essential (primary) hypertension: Secondary | ICD-10-CM | POA: Insufficient documentation

## 2016-11-28 DIAGNOSIS — G4733 Obstructive sleep apnea (adult) (pediatric): Secondary | ICD-10-CM | POA: Diagnosis not present

## 2016-11-28 DIAGNOSIS — I4891 Unspecified atrial fibrillation: Secondary | ICD-10-CM | POA: Diagnosis present

## 2016-11-28 DIAGNOSIS — R609 Edema, unspecified: Secondary | ICD-10-CM

## 2016-11-28 DIAGNOSIS — K219 Gastro-esophageal reflux disease without esophagitis: Secondary | ICD-10-CM | POA: Diagnosis not present

## 2016-11-28 DIAGNOSIS — E119 Type 2 diabetes mellitus without complications: Secondary | ICD-10-CM | POA: Insufficient documentation

## 2016-11-28 DIAGNOSIS — Z7901 Long term (current) use of anticoagulants: Secondary | ICD-10-CM | POA: Diagnosis not present

## 2016-11-28 DIAGNOSIS — E785 Hyperlipidemia, unspecified: Secondary | ICD-10-CM | POA: Insufficient documentation

## 2016-11-28 DIAGNOSIS — F419 Anxiety disorder, unspecified: Secondary | ICD-10-CM | POA: Insufficient documentation

## 2016-11-28 MED ORDER — DILTIAZEM HCL ER COATED BEADS 120 MG PO CP24: 120.0000 mg | ORAL_CAPSULE | ORAL | 3 refills | Status: DC

## 2016-11-28 NOTE — Patient Instructions (Signed)
Your physician has recommended you make the following change in your medication:  1)Cardizem 120mg  once a day in the evening.

## 2016-11-28 NOTE — Discharge Instructions (Signed)
Use moist heat on the sore/swollen area, on the left cheek ,3 or 4 times a day.  Take Tylenol if needed for pain.  Return here if needed, for problems.  Follow-up with the [facility] fib clinic, tomorrow, as scheduled  Follow-up with your PCP for blood pressure check in 1 week.

## 2016-11-28 NOTE — Progress Notes (Signed)
Primary Care Physician: Viviana Simpler, MD Referring Physician: [facility] ER f/u   Charles Phelps is a 51 y.o. male with a h/o HTN, DM,OSA treated with cpap, that was seen in there ER 4/28 for first onset afib. He was at home when he noted elevated HR and went to the fire station near his house. Afib was documented on monitor strip. He was told to blow the plunger out of syringe and the heart stopped racing. He was then transported to the ER and  was placed on xarlto for chadsvasc score of 2 and told to f/u here.  He states that he had played golf that day,drank a few beers and had [place] food for dinner. He has been having issues with elevated blood pressure. He feels a lot of stress from his job. He drinks small amount of caffeine. Religiously wears cpap.Is obese and does not have regular exercise. He has been having some H/A's that he feels may be from poorly controlled afib.  Today, he denies symptoms of palpitations, chest pain, shortness of breath, orthopnea, PND, lower extremity edema, dizziness, presyncope, syncope, or neurologic sequela. The patient is tolerating medications without difficulties and is otherwise without complaint today.   Past Medical History:  Diagnosis Date  . Allergic rhinitis   . Anxiety   . Elevated LFTs 2000   fatty liver  . GERD (gastroesophageal reflux disease)   . Glucose intolerance (impaired glucose tolerance)   . Hyperlipidemia   . Hypertension   . IBS (irritable bowel syndrome)   . Obstructive sleep apnea   . Osteoarthritis    No past surgical history on file.  Current Outpatient Prescriptions  Medication Sig Dispense Refill  . cetirizine (ZYRTEC) 10 MG tablet Take 10 mg by mouth daily.      . citalopram (CELEXA) 20 MG tablet TAKE 1 TABLET DAILY 90 tablet 1  . hyoscyamine (LEVBID) 0.375 MG 12 hr tablet TAKE 1 TABLET TWICE A DAY AS NEEDED 180 tablet 1  . lisinopril (PRINIVIL,ZESTRIL) 20 MG tablet TAKE 1 TABLET DAILY 90 tablet 1  . metFORMIN  (GLUCOPHAGE) 1000 MG tablet TAKE 1 TABLET TWICE A DAY WITH MEALS 180 tablet 3  . Misc Natural Products (BLACK CHERRY CONCENTRATE PO) Take by mouth.    . Multiple Vitamin (MULTIVITAMIN WITH MINERALS) TABS tablet Take 1 tablet by mouth daily.    Marland Kitchen omeprazole (PRILOSEC) 20 MG capsule TAKE 1 CAPSULE DAILY 90 capsule 1  . ONE TOUCH ULTRA TEST test strip USE AS INSTRUCTED TO CHECK BLOOD SUGAR ONCE DAILY 100 each 3  . ONETOUCH DELICA LANCETS FINE MISC USE TO CHECK BLOOD SUGAR ONCE DAILY 200 each 3  . rivaroxaban (XARELTO) 20 MG TABS tablet Take 1 tablet (20 mg total) by mouth daily with supper. 30 tablet 0  . diltiazem (CARDIZEM CD) 120 MG 24 hr capsule Take 1 capsule (120 mg total) by mouth daily after supper. 30 capsule 3   No current facility-administered medications for this encounter.     Allergies  Allergen Reactions  . Atorvastatin Other (See Comments)    Memory problems    Social History   Social History  . Marital status: Married    Spouse name: N/A  . Number of children: 1  . Years of education: N/A   Occupational History  . Industrial / Occupational hygienist   Social History Main Topics  . Smoking status: Never Smoker  . Smokeless tobacco: Current User  . Alcohol use Yes  Comment: Occasionally  . Drug use: No  . Sexual activity: Not on file   Other Topics Concern  . Not on file   Social History Narrative  . No narrative on file    Family History  Problem Relation Age of Onset  . Hypertension Mother   . Diabetes Mother   . Heart disease Father     CABG at 95  . Cancer Father     lung  . Diabetes Sister   . Diabetes Brother     ROS- All systems are reviewed and negative except as per the HPI above  Physical Exam: Vitals:   11/28/16 1423  BP: (!) 154/102  Pulse: 76  Weight: 288 lb (130.6 kg)  Height: 6\' 3"  (1.905 m)   Wt Readings from Last 3 Encounters:  11/28/16 288 lb (130.6 kg)  11/24/16 295 lb (133.8 kg)  09/30/16 297 lb 6.4 oz (134.9  kg)    Labs: Lab Results  Component Value Date   NA 139 11/24/2016   K 3.6 11/24/2016   CL 103 11/24/2016   CO2 23 11/24/2016   GLUCOSE 334 (H) 11/24/2016   BUN 15 11/24/2016   CREATININE 1.30 (H) 11/24/2016   CALCIUM 9.3 11/24/2016   PHOS 4.1 10/27/2009   MG 2.0 11/24/2016   No results found for: INR Lab Results  Component Value Date   CHOL 226 (H) 06/06/2016   HDL 43.30 06/06/2016   LDLCALC 58 08/12/2013   TRIG 237.0 (H) 06/06/2016     GEN- The patient is well appearing, alert and oriented x 3 today.   Head- normocephalic, atraumatic Eyes-  Sclera clear, conjunctiva pink Ears- hearing intact Oropharynx- clear Neck- supple, no JVP Lymph- no cervical lymphadenopathy Lungs- Clear to ausculation bilaterally, normal work of breathing Heart- Regular rate and rhythm, no murmurs, rubs or gallops, PMI not laterally displaced GI- soft, NT, ND, + BS Extremities- no clubbing, cyanosis, or edema MS- no significant deformity or atrophy Skin- no rash or lesion Psych- euthymic mood, full affect Neuro- strength and sensation are intact  EKG- NSR at 76 bpm, Pr int 166 ms, qrs int 90 ms, qtc 447 ms EPic records reviewed    Assessment and Plan: 1. New onset afib General education re afib Start cardizem 120 mg a day to discourage Afib as well as treat BP Bleeding precautions discussed, avoid NSAID's Continue xarelto 20 mg daily for chadsvasc score of 2 Echo   2. Lifestyle issues Decrease alcohol intake to no more than 2 drinks a week Continue cpap Weight loss encouraged Regular exercise encouraged  3. HTN Watch salt intake  Weight loss Hopefully cardizem 120 mg a day in addition to lisinopril 20 mg a day will better manage BP F/u with PCP if  BP still not in range in 1-2 weeks  Charles Phelps, [facility] [address] [place], [place] [address] [phone]

## 2016-11-28 NOTE — ED Provider Notes (Signed)
[facility] DEPT Provider Note   CSN: [id number] Arrival date & time: 11/27/16  2135   By signing my name below, I, Charolotte Eke, attest that this documentation has been prepared under the direction and in the presence of Daleen Bo, MD. Electronically Signed: Charolotte Eke, Scribe. 11/28/16. 1:45 AM.   History   Chief Complaint Chief Complaint  Patient presents with  . Facial Swelling  . Allergic Reaction    HPI Charles Phelps is a 51 y.o. male with h/o HTN who presents to the Emergency Department complaining of a reaction that started 4 days ago after being started on xrelto. The reaction includes facial swelling and ear pain. Pt was diagnosed with A fib 3 days ago. Pt got cramps in both feet, and in his chest and back. Pt's wife took BP and reported that it was really high. Pt then went to fire station and was told he was in A fib. Pt "blew in a syringe and fixed his a fib" in the ED. He denies nasal congestion, trouble swallowing, chest pain, fever.    The history is provided by the patient. No language interpreter was used.    Past Medical History:  Diagnosis Date  . Allergic rhinitis   . Anxiety   . Elevated LFTs 2000   fatty liver  . GERD (gastroesophageal reflux disease)   . Glucose intolerance (impaired glucose tolerance)   . Hyperlipidemia   . Hypertension   . IBS (irritable bowel syndrome)   . Obstructive sleep apnea   . Osteoarthritis     Patient Active Problem List   Diagnosis Date Noted  . Nasal lesion 01/26/2016  . OSA (obstructive sleep apnea) 08/29/2014  . Gout, unspecified 09/03/2011  . Diabetes mellitus type 2, controlled, without complications ([place]) 85/27/7824  . Routine general medical examination at a health care facility 02/01/2011  . IBS 02/27/2009  . Essential hypertension, benign 01/27/2009  . OSTEOARTHRITIS 02/21/2007  . Hyperlipemia 02/17/2007  . Mood disorder ([place]) 02/17/2007  . Allergic rhinitis 02/17/2007  . GERD 02/17/2007  .  ARTHRITIS, LEFT HIP 02/17/2007    No past surgical history on file.     Home Medications    Prior to Admission medications   Medication Sig Start Date End Date Taking? Authorizing Provider  cetirizine (ZYRTEC) 10 MG tablet Take 10 mg by mouth daily.     Yes Historical Provider, MD  citalopram (CELEXA) 20 MG tablet TAKE 1 TABLET DAILY 08/19/16  Yes Venia Carbon, MD  lisinopril (PRINIVIL,ZESTRIL) 20 MG tablet TAKE 1 TABLET DAILY 08/19/16  Yes Venia Carbon, MD  metFORMIN (GLUCOPHAGE) 1000 MG tablet TAKE 1 TABLET TWICE A DAY WITH MEALS 07/15/16  Yes Venia Carbon, MD  Multiple Vitamin (MULTIVITAMIN WITH MINERALS) TABS tablet Take 1 tablet by mouth daily.   Yes Historical Provider, MD  omeprazole (PRILOSEC) 20 MG capsule TAKE 1 CAPSULE DAILY 08/19/16  Yes Venia Carbon, MD  rivaroxaban (XARELTO) 20 MG TABS tablet Take 1 tablet (20 mg total) by mouth daily with supper. 11/24/16  Yes Everlene Balls, MD  hyoscyamine (LEVBID) 0.375 MG 12 hr tablet TAKE 1 TABLET TWICE A DAY AS NEEDED Patient not taking: Reported on 11/28/2016 08/19/16   Venia Carbon, MD  ONE TOUCH ULTRA TEST test strip USE AS INSTRUCTED TO CHECK BLOOD SUGAR ONCE DAILY 09/23/16   Venia Carbon, MD  [facility] DELICA LANCETS FINE MISC USE TO CHECK BLOOD SUGAR ONCE DAILY 12/26/15   Venia Carbon, MD  Family History Family History  Problem Relation Age of Onset  . Hypertension Mother   . Diabetes Mother   . Heart disease Father     CABG at 34  . Cancer Father     lung  . Diabetes Sister   . Diabetes Brother     Social History Social History  Substance Use Topics  . Smoking status: Never Smoker  . Smokeless tobacco: Current User  . Alcohol use Yes     Comment: Occasionally     Allergies   Atorvastatin   Review of Systems Review of Systems  All other systems reviewed and are negative.    Physical Exam Updated Vital Signs BP (!) 178/97   Pulse 63   Resp 20   SpO2 98%   Physical Exam    Constitutional: He is oriented to person, place, and time. He appears well-developed and well-nourished.  HENT:  Head: Normocephalic and atraumatic.  Right Ear: External ear normal.  Left Ear: External ear normal.  Slightly enlarged left parotid gland. No tenderness or erythema overlying. No mass, left buccle mucosa. Normal TM's bilaterally.  Eyes: Conjunctivae and EOM are normal. Pupils are equal, round, and reactive to light.  Neck: Normal range of motion and phonation normal. Neck supple.  Cardiovascular: Normal rate, regular rhythm and normal heart sounds.   Pulmonary/Chest: Effort normal and breath sounds normal. He exhibits no bony tenderness.  Musculoskeletal: Normal range of motion.  Neurological: He is alert and oriented to person, place, and time. No cranial nerve deficit or sensory deficit. He exhibits normal muscle tone. Coordination normal.  Skin: Skin is warm, dry and intact.  Psychiatric: He has a normal mood and affect. His behavior is normal. Judgment and thought content normal.  Nursing note and vitals reviewed.    ED Treatments / Results   DIAGNOSTIC STUDIES: Oxygen Saturation is 98% on room air, normal by my interpretation.    COORDINATION OF CARE: 1:43 AM Discussed treatment plan with pt at bedside and pt agreed to plan.    Labs (all labs ordered are listed, but only abnormal results are displayed) Labs Reviewed - No data to display  EKG  EKG Interpretation None       Radiology No results found.  Procedures Procedures (including critical care time)  Medications Ordered in ED Medications - No data to display   Initial Impression / Assessment and Plan / ED Course  I have reviewed the triage vital signs and the nursing notes.  Pertinent labs & imaging results that were available during my care of the patient were reviewed by me and considered in my medical decision making (see chart for details).     Medications - No data to  display  Patient Vitals for the past 24 hrs:  BP Pulse Resp SpO2  11/28/16 0230 (!) 178/97 63 - 98 %  11/28/16 0200 (!) 182/98 (!) 58 - 97 %  11/28/16 0145 (!) 181/99 62 - 98 %  11/27/16 2148 (!) 179/90 64 20 98 %    At D/C Reevaluation with update and discussion. After initial assessment and treatment, an updated evaluation reveals no change in clinical status.  Findings discussed with the patient and all questions were answered. Mordechai Matuszak L   Final Clinical Impressions(s) / ED Diagnoses   Final diagnoses:  Parotid swelling   Nonspecific parotid swelling.  No evidence for allergic reaction to Xarelto.  Doubt serious bacterial infection, fracture or soft tissue infection.  Nursing Notes Reviewed/ Care Coordinated Applicable Imaging  Reviewed Interpretation of Laboratory Data incorporated into ED treatment  The patient appears reasonably screened and/or stabilized for discharge and I doubt any other medical condition or other [facility] requiring further screening, evaluation, or treatment in the ED at this time prior to discharge.  Plan: Home Medications-Tylenol for pain.; Home Treatments-warm compress to affected area; return here if the recommended treatment, does not improve the symptoms; Recommended follow up-PCP, as needed    New Prescriptions Discharge Medication List as of 11/28/2016  3:16 AM     I personally performed the services described in this documentation, which was scribed in my presence. The recorded information has been reviewed and is accurate.     Daleen Bo, MD 11/28/16 [phone]

## 2016-11-29 ENCOUNTER — Ambulatory Visit: Payer: BLUE CROSS/BLUE SHIELD | Admitting: Family Medicine

## 2016-12-03 ENCOUNTER — Ambulatory Visit (HOSPITAL_COMMUNITY)
Admission: RE | Admit: 2016-12-03 | Discharge: 2016-12-03 | Disposition: A | Discharge status: Home / Self Care | Payer: BLUE CROSS/BLUE SHIELD | Source: Ambulatory Visit | Attending: Nurse Practitioner | Admitting: Nurse Practitioner

## 2016-12-03 DIAGNOSIS — E785 Hyperlipidemia, unspecified: Secondary | ICD-10-CM | POA: Diagnosis not present

## 2016-12-03 DIAGNOSIS — I10 Essential (primary) hypertension: Secondary | ICD-10-CM | POA: Diagnosis not present

## 2016-12-03 DIAGNOSIS — I480 Paroxysmal atrial fibrillation: Secondary | ICD-10-CM

## 2016-12-03 DIAGNOSIS — E119 Type 2 diabetes mellitus without complications: Secondary | ICD-10-CM | POA: Insufficient documentation

## 2016-12-03 NOTE — Progress Notes (Signed)
  Echocardiogram 2D Echocardiogram has been performed.  Sabrin Dunlevy T Keidra Withers 12/03/2016, 2:51 PM

## 2016-12-19 ENCOUNTER — Encounter (HOSPITAL_COMMUNITY): Payer: Self-pay | Admitting: Nurse Practitioner

## 2016-12-19 ENCOUNTER — Ambulatory Visit (HOSPITAL_COMMUNITY)
Admission: RE | Admit: 2016-12-19 | Discharge: 2016-12-19 | Disposition: A | Discharge status: Home / Self Care | Payer: BLUE CROSS/BLUE SHIELD | Source: Ambulatory Visit | Attending: Nurse Practitioner | Admitting: Nurse Practitioner

## 2016-12-19 VITALS — BP 150/94 | HR 68 | Ht 75.0 in | Wt 290.8 lb

## 2016-12-19 DIAGNOSIS — E785 Hyperlipidemia, unspecified: Secondary | ICD-10-CM | POA: Diagnosis not present

## 2016-12-19 DIAGNOSIS — Z7984 Long term (current) use of oral hypoglycemic drugs: Secondary | ICD-10-CM | POA: Diagnosis not present

## 2016-12-19 DIAGNOSIS — R9431 Abnormal electrocardiogram [ECG] [EKG]: Secondary | ICD-10-CM | POA: Insufficient documentation

## 2016-12-19 DIAGNOSIS — Z7901 Long term (current) use of anticoagulants: Secondary | ICD-10-CM | POA: Diagnosis not present

## 2016-12-19 DIAGNOSIS — I480 Paroxysmal atrial fibrillation: Secondary | ICD-10-CM | POA: Diagnosis not present

## 2016-12-19 DIAGNOSIS — K219 Gastro-esophageal reflux disease without esophagitis: Secondary | ICD-10-CM | POA: Insufficient documentation

## 2016-12-19 DIAGNOSIS — Z79899 Other long term (current) drug therapy: Secondary | ICD-10-CM | POA: Diagnosis not present

## 2016-12-19 DIAGNOSIS — I10 Essential (primary) hypertension: Secondary | ICD-10-CM | POA: Diagnosis not present

## 2016-12-19 DIAGNOSIS — I4891 Unspecified atrial fibrillation: Secondary | ICD-10-CM | POA: Diagnosis present

## 2016-12-19 DIAGNOSIS — E119 Type 2 diabetes mellitus without complications: Secondary | ICD-10-CM | POA: Diagnosis not present

## 2016-12-19 DIAGNOSIS — G4733 Obstructive sleep apnea (adult) (pediatric): Secondary | ICD-10-CM | POA: Diagnosis not present

## 2016-12-19 MED ORDER — RIVAROXABAN 20 MG PO TABS: 20.0000 mg | ORAL_TABLET | Freq: Every day | ORAL | 2 refills | Status: DC

## 2016-12-19 MED ORDER — RIVAROXABAN 20 MG PO TABS: 20.0000 mg | ORAL_TABLET | Freq: Every day | ORAL | 3 refills | Status: DC

## 2016-12-19 NOTE — Progress Notes (Signed)
Primary Care Physician: Venia Carbon, MD Referring Physician: [facility] ER f/u   Charles Phelps is a 51 y.o. male with a h/o HTN, DM,OSA treated with cpap, that was seen in there ER 4/28 for first onset afib. He was at home when he noted elevated HR and went to the fire station near his house. Afib was documented on monitor strip. He was told to blow the plunger out of syringe and the heart stopped racing. He was then transported to the ER and  was placed on xarlto for chadsvasc score of 2(htn/DM) and told to f/u here.  He states that he had played golf that day,drank a few beers and had [place] food for dinner. He has been having issues with elevated blood pressure. He feels a lot of stress from his job. He drinks small amount of caffeine. Religiously wears cpap.Is obese and does not have regular exercise. He has been having some H/A's that he feels may be from poorly controlled afib.  Returns to afib clinic 5/24, he has not had any further afib. He is taking xarelto without bleeding issues. No issues with daily cardizem. Has not had any luck with weight loss and has gained 5 lbs. Wife states that he makes poor food choices. Echo done and shows some mild focal basal septal  Hypertrophy. BP rechecked at 136/74.  Today, he denies symptoms of palpitations, chest pain, shortness of breath, orthopnea, PND, lower extremity edema, dizziness, presyncope, syncope, or neurologic sequela. The patient is tolerating medications without difficulties and is otherwise without complaint today.   Past Medical History:  Diagnosis Date  . Allergic rhinitis   . Anxiety   . Elevated LFTs 2000   fatty liver  . GERD (gastroesophageal reflux disease)   . Glucose intolerance (impaired glucose tolerance)   . Hyperlipidemia   . Hypertension   . IBS (irritable bowel syndrome)   . Obstructive sleep apnea   . Osteoarthritis    No past surgical history on file.  Current Outpatient Prescriptions  Medication Sig  Dispense Refill  . cetirizine (ZYRTEC) 10 MG tablet Take 10 mg by mouth daily.      . citalopram (CELEXA) 20 MG tablet TAKE 1 TABLET DAILY 90 tablet 1  . diltiazem (CARDIZEM CD) 120 MG 24 hr capsule Take 1 capsule (120 mg total) by mouth daily after supper. 30 capsule 3  . hyoscyamine (LEVBID) 0.375 MG 12 hr tablet TAKE 1 TABLET TWICE A DAY AS NEEDED 180 tablet 1  . lisinopril (PRINIVIL,ZESTRIL) 20 MG tablet TAKE 1 TABLET DAILY 90 tablet 1  . metFORMIN (GLUCOPHAGE) 1000 MG tablet TAKE 1 TABLET TWICE A DAY WITH MEALS 180 tablet 3  . Misc Natural Products (BLACK CHERRY CONCENTRATE PO) Take by mouth.    . Multiple Vitamin (MULTIVITAMIN WITH MINERALS) TABS tablet Take 1 tablet by mouth daily.    Marland Kitchen omeprazole (PRILOSEC) 20 MG capsule TAKE 1 CAPSULE DAILY 90 capsule 1  . ONE TOUCH ULTRA TEST test strip USE AS INSTRUCTED TO CHECK BLOOD SUGAR ONCE DAILY 100 each 3  . ONETOUCH DELICA LANCETS FINE MISC USE TO CHECK BLOOD SUGAR ONCE DAILY 200 each 3  . rivaroxaban (XARELTO) 20 MG TABS tablet Take 1 tablet (20 mg total) by mouth daily with supper. 30 tablet 0   No current facility-administered medications for this encounter.     Allergies  Allergen Reactions  . Atorvastatin Other (See Comments)    Memory problems    Social History   Social  History  . Marital status: Married    Spouse name: N/A  . Number of children: 1  . Years of education: N/A   Occupational History  . Industrial / Occupational hygienist   Social History Main Topics  . Smoking status: Never Smoker  . Smokeless tobacco: Current User  . Alcohol use Yes     Comment: Occasionally  . Drug use: No  . Sexual activity: Not on file   Other Topics Concern  . Not on file   Social History Narrative  . No narrative on file    Family History  Problem Relation Age of Onset  . Hypertension Mother   . Diabetes Mother   . Heart disease Father        CABG at 20  . Cancer Father        lung  . Diabetes Sister   .  Diabetes Brother     ROS- All systems are reviewed and negative except as per the HPI above  Physical Exam: Vitals:   12/19/16 1530  BP: (!) 150/94  Pulse: 68  Weight: 290 lb 12.8 oz (131.9 kg)  Height: 6\' 3"  (1.905 m)   Wt Readings from Last 3 Encounters:  12/19/16 290 lb 12.8 oz (131.9 kg)  11/28/16 288 lb (130.6 kg)  11/24/16 295 lb (133.8 kg)    Labs: Lab Results  Component Value Date   NA 139 11/24/2016   K 3.6 11/24/2016   CL 103 11/24/2016   CO2 23 11/24/2016   GLUCOSE 334 (H) 11/24/2016   BUN 15 11/24/2016   CREATININE 1.30 (H) 11/24/2016   CALCIUM 9.3 11/24/2016   PHOS 4.1 10/27/2009   MG 2.0 11/24/2016   No results found for: INR Lab Results  Component Value Date   CHOL 226 (H) 06/06/2016   HDL 43.30 06/06/2016   LDLCALC 58 08/12/2013   TRIG 237.0 (H) 06/06/2016     GEN- The patient is well appearing, alert and oriented x 3 today.   Head- normocephalic, atraumatic Eyes-  Sclera clear, conjunctiva pink Ears- hearing intact Oropharynx- clear Neck- supple, no JVP Lymph- no cervical lymphadenopathy Lungs- Clear to ausculation bilaterally, normal work of breathing Heart- Regular rate and rhythm, no murmurs, rubs or gallops, PMI not laterally displaced GI- soft, NT, ND, + BS Extremities- no clubbing, cyanosis, or edema MS- no significant deformity or atrophy Skin- no rash or lesion Psych- euthymic mood, full affect Neuro- strength and sensation are intact  EKG- NSR at 68 bpm, Pr int 172 ms, qrs int 92d ms, qtc 427 ms EPic records reviewed Echo-Study Conclusions  - Left ventricle: The cavity size was normal. There was mild focal   basal hypertrophy of the septum. Systolic function was normal.   The estimated ejection fraction was in the range of 55% to 60%.   Wall motion was normal; there were no regional wall motion   abnormalities. There was an increased relative contribution of   atrial contraction to ventricular filling. Doppler parameters  are   consistent with abnormal left ventricular relaxation (grade 1   diastolic dysfunction). - Left atrium: The atrium was mildly dilated.    Assessment and Plan: 1. New onset afib General education re afib Continue cardizem 120 mg a day to discourage Afib as well as treat BP Bleeding precautions discussed, avoid NSAID's Continue xarelto 20 mg daily for chadsvasc score of 2   2. Lifestyle issues Decrease alcohol intake to no more than 2 drinks a week Continue cpap  Weight loss encouraged Regular exercise encouraged  3. HTN Watch salt intake  Weight loss Coontiue cardizem 120 mg a day in addition to lisinopril 20 mg a day will better manage BP  F/u with PCP  afib clinic as needed   Butch Penny C. Damel Querry, [facility] [address] [place], [place] [address] [phone]

## 2016-12-20 ENCOUNTER — Telehealth (HOSPITAL_COMMUNITY): Payer: Self-pay | Admitting: *Deleted

## 2016-12-20 ENCOUNTER — Other Ambulatory Visit: Payer: Self-pay | Admitting: Internal Medicine

## 2016-12-20 NOTE — Telephone Encounter (Signed)
PT PA request for Xarelto approved through 12/24/2017.  Pt notified.  Will pick up sample bottle on Tuesday until mailorder comes in

## 2017-01-01 ENCOUNTER — Other Ambulatory Visit (HOSPITAL_COMMUNITY): Payer: Self-pay | Admitting: *Deleted

## 2017-01-01 MED ORDER — DILTIAZEM HCL ER COATED BEADS 120 MG PO CP24
120.0000 mg | ORAL_CAPSULE | Freq: Every day | ORAL | 3 refills | Status: DC
Start: 2017-01-01 — End: 2017-12-09

## 2017-01-01 MED ORDER — DILTIAZEM HCL ER COATED BEADS 120 MG PO CP24
120.0000 mg | ORAL_CAPSULE | Freq: Every day | ORAL | 3 refills | Status: DC
Start: 2017-01-01 — End: 2017-01-30

## 2017-01-13 DIAGNOSIS — L910 Hypertrophic scar: Secondary | ICD-10-CM | POA: Diagnosis not present

## 2017-01-30 ENCOUNTER — Encounter: Payer: Self-pay | Admitting: Internal Medicine

## 2017-01-30 ENCOUNTER — Ambulatory Visit (INDEPENDENT_AMBULATORY_CARE_PROVIDER_SITE_OTHER): Payer: BLUE CROSS/BLUE SHIELD | Admitting: Internal Medicine

## 2017-01-30 VITALS — BP 132/80 | HR 69 | Temp 98.1°F | Ht 74.5 in | Wt 276.0 lb

## 2017-01-30 DIAGNOSIS — I10 Essential (primary) hypertension: Secondary | ICD-10-CM

## 2017-01-30 DIAGNOSIS — E119 Type 2 diabetes mellitus without complications: Secondary | ICD-10-CM | POA: Diagnosis not present

## 2017-01-30 DIAGNOSIS — Z23 Encounter for immunization: Secondary | ICD-10-CM | POA: Diagnosis not present

## 2017-01-30 DIAGNOSIS — I480 Paroxysmal atrial fibrillation: Secondary | ICD-10-CM

## 2017-01-30 DIAGNOSIS — Z0000 Encounter for general adult medical examination without abnormal findings: Secondary | ICD-10-CM | POA: Diagnosis not present

## 2017-01-30 DIAGNOSIS — Z1211 Encounter for screening for malignant neoplasm of colon: Secondary | ICD-10-CM | POA: Diagnosis not present

## 2017-01-30 LAB — HM DIABETES FOOT EXAM

## 2017-01-30 LAB — HEMOGLOBIN A1C: HEMOGLOBIN A1C: 6.8 % — AB (ref 4.6–6.5)

## 2017-01-30 NOTE — Assessment & Plan Note (Signed)
BP Readings from Last 3 Encounters:  01/30/17 132/80  12/19/16 (!) 150/94  11/28/16 (!) 154/102

## 2017-01-30 NOTE — Addendum Note (Signed)
Addended by: Pilar Grammes on: 01/30/2017 05:16 PM   Modules accepted: Orders

## 2017-01-30 NOTE — Assessment & Plan Note (Signed)
Hopefully still reasonable control Would try glipizide if over 8%

## 2017-01-30 NOTE — Assessment & Plan Note (Signed)
New diagnosis On xarelto due to other medical conditions

## 2017-01-30 NOTE — Progress Notes (Signed)
Subjective:    Patient ID: Charles Phelps, male    DOB: 1966/03/14, 51 y.o.   MRN: [id number]  HPI Here for physical  New diagnosis of atrial fibrillation Now on xarelto and diltiazem  Not checking sugars lately Trying to lose some weight--- has gone down some Had eye exam in January No foot sores, numbness or pain  Feels more calm on the citalopram Also on paroxetine in the past Wife mostly notes trouble when he wasn't on it New boss at work is a good thing  Current Outpatient Prescriptions on File Prior to Visit  Medication Sig Dispense Refill  . cetirizine (ZYRTEC) 10 MG tablet Take 10 mg by mouth daily.      . citalopram (CELEXA) 20 MG tablet TAKE 1 TABLET DAILY 90 tablet 1  . diltiazem (CARDIZEM CD) 120 MG 24 hr capsule Take 1 capsule (120 mg total) by mouth daily. 90 capsule 3  . hyoscyamine (LEVBID) 0.375 MG 12 hr tablet TAKE 1 TABLET TWICE A DAY AS NEEDED 180 tablet 1  . lisinopril (PRINIVIL,ZESTRIL) 20 MG tablet TAKE 1 TABLET DAILY 90 tablet 1  . metFORMIN (GLUCOPHAGE) 1000 MG tablet TAKE 1 TABLET TWICE A DAY WITH MEALS 180 tablet 3  . Misc Natural Products (BLACK CHERRY CONCENTRATE PO) Take by mouth.    . Multiple Vitamin (MULTIVITAMIN WITH MINERALS) TABS tablet Take 1 tablet by mouth daily.    Marland Kitchen omeprazole (PRILOSEC) 20 MG capsule TAKE 1 CAPSULE DAILY 90 capsule 1  . ONE TOUCH ULTRA TEST test strip USE AS INSTRUCTED TO CHECK BLOOD SUGAR ONCE DAILY 100 each 3  . ONETOUCH DELICA LANCETS FINE MISC USE TO CHECK BLOOD SUGAR ONCE DAILY 200 each 3  . rivaroxaban (XARELTO) 20 MG TABS tablet Take 1 tablet (20 mg total) by mouth daily with supper. 90 tablet 2   No current facility-administered medications on file prior to visit.     Allergies  Allergen Reactions  . Atorvastatin Other (See Comments)    Memory problems    Past Medical History:  Diagnosis Date  . Allergic rhinitis   . Anxiety   . Elevated LFTs 2000   fatty liver  . GERD (gastroesophageal reflux  disease)   . Glucose intolerance (impaired glucose tolerance)   . Hyperlipidemia   . Hypertension   . IBS (irritable bowel syndrome)   . Obstructive sleep apnea   . Osteoarthritis     No past surgical history on file.  Family History  Problem Relation Age of Onset  . Hypertension Mother   . Diabetes Mother   . Heart disease Father        CABG at 53  . Cancer Father        lung  . Diabetes Sister   . Diabetes Brother     Social History   Social History  . Marital status: Married    Spouse name: N/A  . Number of children: 1  . Years of education: N/A   Occupational History  . Industrial / Occupational hygienist   Social History Main Topics  . Smoking status: Never Smoker  . Smokeless tobacco: Never Used  . Alcohol use Yes     Comment: Occasionally  . Drug use: No  . Sexual activity: Not on file   Other Topics Concern  . Not on file   Social History Narrative  . No narrative on file   Review of Systems  Constitutional: Negative for fatigue.  Wears seat belt Trying to walk some  HENT: Negative for dental problem, hearing loss, tinnitus and trouble swallowing.        Keeps up with dentist  Eyes: Negative for visual disturbance.       No diplopia or unilateral vision loss  Respiratory: Negative for cough, chest tightness and shortness of breath.   Cardiovascular: Negative for chest pain, palpitations and leg swelling.  Gastrointestinal: Negative for abdominal pain, blood in stool, constipation and nausea.       No heartburn on PPI  Endocrine: Negative for polydipsia and polyuria.  Genitourinary: Negative for difficulty urinating and urgency.       No ED  Musculoskeletal: Positive for arthralgias. Negative for joint swelling.       Still has Achilles' tendonitis at times  Skin: Negative for rash.  Allergic/Immunologic: Positive for environmental allergies. Negative for immunocompromised state.       Cetirizine helps  Neurological: Positive for  headaches. Negative for dizziness, syncope and light-headedness.  Hematological: Negative for adenopathy. Bruises/bleeds easily.  Psychiatric/Behavioral: Negative for dysphoric mood and sleep disturbance.       Objective:   Physical Exam  Constitutional: He is oriented to person, place, and time. He appears well-nourished. No distress.  HENT:  Head: Normocephalic and atraumatic.  Right Ear: External ear normal.  Left Ear: External ear normal.  Mouth/Throat: Oropharynx is clear and moist. No oropharyngeal exudate.  Eyes: Conjunctivae are normal. Pupils are equal, round, and reactive to light.  Neck: Normal range of motion. Neck supple. No thyromegaly present.  Cardiovascular: Normal rate, regular rhythm, normal heart sounds and intact distal pulses.  Exam reveals no gallop.   No murmur heard. Pulmonary/Chest: Effort normal and breath sounds normal. No respiratory distress. He has no wheezes. He has no rales.  Abdominal: Soft. There is no tenderness.  Musculoskeletal: He exhibits no edema or tenderness.  Lymphadenopathy:    He has no cervical adenopathy.  Neurological: He is alert and oriented to person, place, and time.  Normal sensation in feet  Skin: No rash noted. No erythema.  No foot lesions  Psychiatric: He has a normal mood and affect. His behavior is normal.          Assessment & Plan:

## 2017-01-30 NOTE — Assessment & Plan Note (Signed)
Discussed fitness Recent PSA Will do FIT prevnar due to the diabetes

## 2017-02-06 DIAGNOSIS — G4733 Obstructive sleep apnea (adult) (pediatric): Secondary | ICD-10-CM | POA: Diagnosis not present

## 2017-02-13 ENCOUNTER — Other Ambulatory Visit: Payer: Self-pay | Admitting: Internal Medicine

## 2017-03-03 ENCOUNTER — Encounter: Payer: Self-pay | Admitting: Primary Care

## 2017-03-03 ENCOUNTER — Ambulatory Visit (INDEPENDENT_AMBULATORY_CARE_PROVIDER_SITE_OTHER): Payer: BLUE CROSS/BLUE SHIELD | Admitting: Primary Care

## 2017-03-03 VITALS — BP 136/80 | HR 67 | Temp 98.1°F | Ht 74.5 in | Wt 274.8 lb

## 2017-03-03 DIAGNOSIS — J069 Acute upper respiratory infection, unspecified: Secondary | ICD-10-CM

## 2017-03-03 MED ORDER — AMOXICILLIN 875 MG PO TABS: 875.0000 mg | ORAL_TABLET | Freq: Two times a day (BID) | ORAL | 0 refills | Status: DC

## 2017-03-03 NOTE — Patient Instructions (Signed)
Start amoxicillin antibiotics. Take 1 tablet by mouth twice daily for 7 days.  Ensure you are staying hydrated with water and rest.  Consider switching from Zyrtec to Xyzal for allergies/congestion.  It was a pleasure meeting you!

## 2017-03-03 NOTE — Progress Notes (Signed)
Subjective:    Patient ID: Charles Phelps, male    DOB: [birth year], 51 y.o.   MRN: [id number]  HPI  Mr. Arizola is a 51 year old male with a history of allergic rhinitis, type 2 diabetes, OSA who presents today with a chief complaint of nasal congestion. He also reports cough, sore throat, fevers, feeling "run down". His symptoms began about two weeks ago. His cough is productive. Overall he's feeling about the same when symptoms began. He's been taking tylenol, "cold medication" with temporary improvement.   Review of Systems  Constitutional: Positive for fatigue and fever.  HENT: Positive for congestion and sore throat. Negative for ear pain and sinus pressure.   Respiratory: Positive for cough. Negative for shortness of breath.   Cardiovascular: Negative for chest pain.       Past Medical History:  Diagnosis Date  . Allergic rhinitis   . Anxiety   . Elevated LFTs 2000   fatty liver  . GERD (gastroesophageal reflux disease)   . Glucose intolerance (impaired glucose tolerance)   . Hyperlipidemia   . Hypertension   . IBS (irritable bowel syndrome)   . Obstructive sleep apnea   . Osteoarthritis      Social History   Social History  . Marital status: Married    Spouse name: N/A  . Number of children: 1  . Years of education: N/A   Occupational History  . Industrial / Occupational hygienist   Social History Main Topics  . Smoking status: Never Smoker  . Smokeless tobacco: Never Used  . Alcohol use Yes     Comment: Occasionally  . Drug use: No  . Sexual activity: Not on file   Other Topics Concern  . Not on file   Social History Narrative  . No narrative on file    No past surgical history on file.  Family History  Problem Relation Age of Onset  . Hypertension Mother   . Diabetes Mother   . Heart disease Father        CABG at 56  . Cancer Father        lung  . Diabetes Sister   . Diabetes Brother     Allergies  Allergen Reactions  .  Atorvastatin Other (See Comments)    Memory problems    Current Outpatient Prescriptions on File Prior to Visit  Medication Sig Dispense Refill  . cetirizine (ZYRTEC) 10 MG tablet Take 10 mg by mouth daily.      . citalopram (CELEXA) 20 MG tablet TAKE 1 TABLET DAILY 90 tablet 3  . diltiazem (CARDIZEM CD) 120 MG 24 hr capsule Take 1 capsule (120 mg total) by mouth daily. 90 capsule 3  . hyoscyamine (LEVBID) 0.375 MG 12 hr tablet TAKE 1 TABLET TWICE A DAY AS NEEDED 180 tablet 3  . lisinopril (PRINIVIL,ZESTRIL) 20 MG tablet TAKE 1 TABLET DAILY 90 tablet 3  . metFORMIN (GLUCOPHAGE) 1000 MG tablet TAKE 1 TABLET TWICE A DAY WITH MEALS 180 tablet 3  . Misc Natural Products (BLACK CHERRY CONCENTRATE PO) Take by mouth.    . Multiple Vitamin (MULTIVITAMIN WITH MINERALS) TABS tablet Take 1 tablet by mouth daily.    Marland Kitchen omeprazole (PRILOSEC) 20 MG capsule TAKE 1 CAPSULE DAILY 90 capsule 3  . ONE TOUCH ULTRA TEST test strip USE AS INSTRUCTED TO CHECK BLOOD SUGAR ONCE DAILY 100 each 3  . ONETOUCH DELICA LANCETS FINE MISC USE TO CHECK BLOOD SUGAR ONCE DAILY 200 each  3  . rivaroxaban (XARELTO) 20 MG TABS tablet Take 1 tablet (20 mg total) by mouth daily with supper. 90 tablet 2   No current facility-administered medications on file prior to visit.     BP 136/80   Pulse 67   Temp 98.1 F (36.7 C) (Oral)   Ht 6' 2.5" (1.892 m)   Wt 274 lb 12.8 oz (124.6 kg)   SpO2 98%   BMI 34.81 kg/m    Objective:   Physical Exam  Constitutional: He appears well-nourished.  HENT:  Right Ear: Tympanic membrane and ear canal normal.  Left Ear: Tympanic membrane and ear canal normal.  Nose: No mucosal edema. Right sinus exhibits no maxillary sinus tenderness and no frontal sinus tenderness. Left sinus exhibits no maxillary sinus tenderness and no frontal sinus tenderness.  Mouth/Throat: Oropharynx is clear and moist.  Eyes: Conjunctivae are normal.  Neck: Neck supple.  Cardiovascular: Normal rate and regular  rhythm.   Pulmonary/Chest: Effort normal. He has no wheezes. He has rhonchi in the right upper field and the left upper field. He has no rales.  Skin: Skin is warm and dry.          Assessment & Plan:  URI:  Cough, nasal/chest congestion x 2 weeks. Productive cough, overall no improvement in symptoms. Exam today with mild-moderate rhonchi to upper fields. Does appear tired. Given duration of symptoms coupled with presentation, will treat. Rx for Amoxil course sent to pharmacy.  Fluids, rest, follow up PRN.  Sheral Flow, NP

## 2017-05-13 DIAGNOSIS — G4733 Obstructive sleep apnea (adult) (pediatric): Secondary | ICD-10-CM | POA: Diagnosis not present

## 2017-05-26 ENCOUNTER — Telehealth: Payer: Self-pay

## 2017-05-26 DIAGNOSIS — E119 Type 2 diabetes mellitus without complications: Secondary | ICD-10-CM

## 2017-05-26 NOTE — Telephone Encounter (Signed)
Spoke to pt. He has a Biometric form. I advised him to fill out his part and sign it if needed. I will fax it if there is a fax number on it. He will bring the form in tomorrow.

## 2017-05-26 NOTE — Telephone Encounter (Signed)
Copied from [facility][id number]. Topic: General - Other >> May 26, 2017  4:12 PM Vernona Rieger wrote: Reason for CRM: patient needs form filled out for insurance by DR LETVAK. Can someone give him a call, he wants to know if he can just drop it off and he sign it and pick it back up. Needs by Friday, thanks

## 2017-05-27 NOTE — Telephone Encounter (Signed)
Pt brought by the Biometric form, but it is requiring a Fasting Glucose and Lipid Panel. He only had an A!C at his CPE. Pt has lab appt 05-29-17. Please put in future orders. Thanks

## 2017-05-28 NOTE — Addendum Note (Signed)
Addended by: Viviana Simpler I on: 05/28/2017 07:44 AM   Modules accepted: Orders

## 2017-05-29 ENCOUNTER — Other Ambulatory Visit (INDEPENDENT_AMBULATORY_CARE_PROVIDER_SITE_OTHER): Payer: BLUE CROSS/BLUE SHIELD

## 2017-05-29 DIAGNOSIS — E119 Type 2 diabetes mellitus without complications: Secondary | ICD-10-CM | POA: Diagnosis not present

## 2017-05-29 LAB — LIPID PANEL
CHOL/HDL RATIO: 5
CHOLESTEROL: 185 mg/dL (ref 0–200)
HDL: 36.9 mg/dL — ABNORMAL LOW (ref >39.00)
LDL Cholesterol: 115 mg/dL — ABNORMAL HIGH (ref 0–99)
NonHDL: 148.29
Triglycerides: 164 mg/dL — ABNORMAL HIGH (ref 0.0–149.0)
VLDL: 32.8 mg/dL (ref 0.0–40.0)

## 2017-05-29 LAB — GLUCOSE, RANDOM: Glucose, Bld: 131 mg/dL — ABNORMAL HIGH (ref 70–99)

## 2017-05-30 ENCOUNTER — Encounter: Payer: Self-pay | Admitting: Internal Medicine

## 2017-05-30 NOTE — Telephone Encounter (Signed)
I spoke to him. We have already faxed it and put the original in the mail to him.

## 2017-05-30 NOTE — Telephone Encounter (Signed)
Mikal Plane with [facility] skyped this message; [05/30/2017 3:19 PM] Mikal Plane:  Pt Charles Phelps Feb 29, 2066 called again regarding papers he had to have in today. I see a CRM from 10/29 but i believe it has not been resolved. Can you please check on this and call patient back. [phone].  Thanks

## 2017-05-30 NOTE — Telephone Encounter (Signed)
See note in labs. Spoke to pt. Form was faxed and sent to pt, also.

## 2017-07-10 ENCOUNTER — Other Ambulatory Visit: Payer: Self-pay | Admitting: Internal Medicine

## 2017-08-05 ENCOUNTER — Ambulatory Visit: Payer: BLUE CROSS/BLUE SHIELD | Admitting: Internal Medicine

## 2017-08-13 DIAGNOSIS — G4733 Obstructive sleep apnea (adult) (pediatric): Secondary | ICD-10-CM | POA: Diagnosis not present

## 2017-08-31 ENCOUNTER — Other Ambulatory Visit (HOSPITAL_COMMUNITY): Payer: Self-pay | Admitting: Nurse Practitioner

## 2017-09-16 ENCOUNTER — Other Ambulatory Visit: Payer: Self-pay | Admitting: Internal Medicine

## 2017-10-01 ENCOUNTER — Encounter: Payer: Self-pay | Admitting: Adult Health

## 2017-10-01 ENCOUNTER — Ambulatory Visit: Payer: BLUE CROSS/BLUE SHIELD | Admitting: Adult Health

## 2017-10-01 DIAGNOSIS — E668 Other obesity: Secondary | ICD-10-CM | POA: Diagnosis not present

## 2017-10-01 DIAGNOSIS — G4733 Obstructive sleep apnea (adult) (pediatric): Secondary | ICD-10-CM

## 2017-10-01 NOTE — Assessment & Plan Note (Signed)
Wt loss  

## 2017-10-01 NOTE — Patient Instructions (Signed)
Continue on CPAP At bedtime Keep up good work .  Work on healthy weight .  Do not drive if sleepy.  Follow up with Dr. Elsworth Soho  In 1 year and As needed   Please contact office for sooner follow up if symptoms do not improve or worsen or seek emergency care

## 2017-10-01 NOTE — Progress Notes (Signed)
@Patient  ID: Charles Phelps, male    DOB: [birth year], 52 y.o.   MRN: [id number]  Chief Complaint  Patient presents with  . Follow-up    OSA    Referring provider: Venia Carbon, MD  HPI: 52 yo male followed for severe OSA on CPAP At bedtime  .   TEST  NPSG 2005: AHI 86/hr.  10/01/2017 Follow up: OSA  Patient returns for a one year follow-up.  Patient says he is doing well on CPAP.  He wears his CPAP machine every single night.  He never misses a night.  Feels rested with no significant daytime sleepiness.  Download shows excellent compliance with 100% usage.  He is using it each night at 8 hours.  He is on auto CPAP 5-13 cm H2O.  AHI 1.2.  Minimum leaks.  Patient does complain that he feels the nasal pillows are causing an irritation on the inside of his nose.  We looked at other mask and he would like to try a nasal mask.  Allergies  Allergen Reactions  . Atorvastatin Other (See Comments)    Memory problems    Immunization History  Administered Date(s) Administered  . Pneumococcal Conjugate-13 01/30/2017  . Td 07/30/1998, 10/27/2009    Past Medical History:  Diagnosis Date  . Allergic rhinitis   . Anxiety   . Elevated LFTs 2000   fatty liver  . GERD (gastroesophageal reflux disease)   . Glucose intolerance (impaired glucose tolerance)   . Hyperlipidemia   . Hypertension   . IBS (irritable bowel syndrome)   . Obstructive sleep apnea   . Osteoarthritis     Tobacco History: Social History   Tobacco Use  Smoking Status Never Smoker  Smokeless Tobacco Never Used   Counseling given: Not Answered   Outpatient Encounter Medications as of 10/01/2017  Medication Sig  . cetirizine (ZYRTEC) 10 MG tablet Take 10 mg by mouth daily.    . citalopram (CELEXA) 20 MG tablet TAKE 1 TABLET DAILY  . diltiazem (CARDIZEM CD) 120 MG 24 hr capsule Take 1 capsule (120 mg total) by mouth daily.  . hyoscyamine (LEVBID) 0.375 MG 12 hr tablet TAKE 1 TABLET TWICE A DAY AS  NEEDED  . lisinopril (PRINIVIL,ZESTRIL) 20 MG tablet TAKE 1 TABLET DAILY  . metFORMIN (GLUCOPHAGE) 1000 MG tablet TAKE 1 TABLET TWICE A DAY WITH MEALS  . Misc Natural Products (BLACK CHERRY CONCENTRATE PO) Take by mouth.  . Multiple Vitamin (MULTIVITAMIN WITH MINERALS) TABS tablet Take 1 tablet by mouth daily.  Marland Kitchen omeprazole (PRILOSEC) 20 MG capsule TAKE 1 CAPSULE DAILY  . ONE TOUCH ULTRA TEST test strip USE AS INSTRUCTED TO CHECK BLOOD SUGAR ONCE DAILY  . ONETOUCH DELICA LANCETS FINE MISC USE TO CHECK BLOOD SUGAR ONCE DAILY  . XARELTO 20 MG TABS tablet TAKE 1 TABLET DAILY WITH SUPPER  . [DISCONTINUED] amoxicillin (AMOXIL) 875 MG tablet Take 1 tablet (875 mg total) by mouth 2 (two) times daily. (Patient not taking: Reported on 10/01/2017)   No facility-administered encounter medications on file as of 10/01/2017.      Review of Systems  Constitutional:   No  weight loss, night sweats,  Fevers, chills, fatigue, or  lassitude.  HEENT:   No headaches,  Difficulty swallowing,  Tooth/dental problems, or  Sore throat,                No sneezing, itching, ear ache, nasal congestion, post nasal drip,   CV:  No chest pain,  Orthopnea,  PND, swelling in lower extremities, anasarca, dizziness, palpitations, syncope.   GI  No heartburn, indigestion, abdominal pain, nausea, vomiting, diarrhea, change in bowel habits, loss of appetite, bloody stools.   Resp: No shortness of breath with exertion or at rest.  No excess mucus, no productive cough,  No non-productive cough,  No coughing up of blood.  No change in color of mucus.  No wheezing.  No chest wall deformity  Skin: no rash or lesions.  GU: no dysuria, change in color of urine, no urgency or frequency.  No flank pain, no hematuria   MS:  No joint pain or swelling.  No decreased range of motion.  No back pain.    Physical Exam  BP 130/86 (BP Location: Left Arm, Cuff Size: Normal)   Pulse 81   Ht 6\' 3"  (1.905 m)   Wt 278 lb 6.4 oz (126.3 kg)    SpO2 99%   BMI 34.80 kg/m   GEN: A/Ox3; pleasant , NAD, obese    HEENT:  [place]/AT,  EACs-clear, TMs-wnl, NOSE-clear, mild irritation along left inner nare, no bleeding , THROAT-clear, no lesions, no postnasal drip or exudate noted.   NECK:  Supple w/ fair ROM; no JVD; normal carotid impulses w/o bruits; no thyromegaly or nodules palpated; no lymphadenopathy.    RESP  Clear  P & A; w/o, wheezes/ rales/ or rhonchi. no accessory muscle use, no dullness to percussion  CARD:  RRR, no m/r/g, no peripheral edema, pulses intact, no cyanosis or clubbing.  GI:   Soft & nt; nml bowel sounds; no organomegaly or masses detected.   Musco: Warm bil, no deformities or joint swelling noted.   Neuro: alert, no focal deficits noted.    Skin: Warm, no lesions or rashes    Lab Results:  CBC    Component Value Date/Time   WBC 7.3 11/24/2016 0115   RBC 5.05 11/24/2016 0115   HGB 14.4 11/24/2016 0115   HCT 40.9 11/24/2016 0115   PLT 168 11/24/2016 0115   MCV 81.0 11/24/2016 0115   MCH 28.5 11/24/2016 0115   MCHC 35.2 11/24/2016 0115   RDW 14.0 11/24/2016 0115   LYMPHSABS 1.3 06/06/2016 0849   MONOABS 0.4 06/06/2016 0849   EOSABS 0.1 06/06/2016 0849   BASOSABS 0.1 06/06/2016 0849    BMET    Component Value Date/Time   NA 139 11/24/2016 0115   K 3.6 11/24/2016 0115   CL 103 11/24/2016 0115   CO2 23 11/24/2016 0115   GLUCOSE 131 (H) 05/29/2017 0815   BUN 15 11/24/2016 0115   CREATININE 1.30 (H) 11/24/2016 0115   CALCIUM 9.3 11/24/2016 0115   GFRNONAA >60 11/24/2016 0115   GFRAA >60 11/24/2016 0115    BNP No results found for: BNP  ProBNP No results found for: PROBNP  Imaging: No results found.   Assessment & Plan:   OSA (obstructive sleep apnea) Excellent control  Change to nasal mask for comfort   Plan  Patient Instructions  Continue on CPAP At bedtime Keep up good work .  Work on healthy weight .  Do not drive if sleepy.  Follow up with Dr. Elsworth Soho  In 1 year and As  needed   Please contact office for sooner follow up if symptoms do not improve or worsen or seek emergency care       Moderate obesity Wt loss      Rexene Edison, NP 10/01/2017

## 2017-10-01 NOTE — Assessment & Plan Note (Signed)
Excellent control  Change to nasal mask for comfort   Plan  Patient Instructions  Continue on CPAP At bedtime Keep up good work .  Work on healthy weight .  Do not drive if sleepy.  Follow up with Dr. Elsworth Soho  In 1 year and As needed   Please contact office for sooner follow up if symptoms do not improve or worsen or seek emergency care

## 2017-10-07 ENCOUNTER — Encounter: Payer: Self-pay | Admitting: Adult Health

## 2017-10-07 DIAGNOSIS — G4733 Obstructive sleep apnea (adult) (pediatric): Secondary | ICD-10-CM

## 2017-11-20 DIAGNOSIS — G4733 Obstructive sleep apnea (adult) (pediatric): Secondary | ICD-10-CM | POA: Diagnosis not present

## 2017-11-26 DIAGNOSIS — E119 Type 2 diabetes mellitus without complications: Secondary | ICD-10-CM | POA: Diagnosis not present

## 2017-11-27 LAB — HM DIABETES EYE EXAM

## 2017-12-09 ENCOUNTER — Other Ambulatory Visit (HOSPITAL_COMMUNITY): Payer: Self-pay | Admitting: Nurse Practitioner

## 2017-12-09 NOTE — Telephone Encounter (Signed)
Approved: okay for a year 

## 2017-12-10 ENCOUNTER — Ambulatory Visit: Payer: BLUE CROSS/BLUE SHIELD | Admitting: Internal Medicine

## 2017-12-10 ENCOUNTER — Encounter: Payer: Self-pay | Admitting: Internal Medicine

## 2017-12-10 VITALS — BP 128/88 | HR 75 | Temp 98.1°F | Ht 75.0 in | Wt 286.0 lb

## 2017-12-10 DIAGNOSIS — F39 Unspecified mood [affective] disorder: Secondary | ICD-10-CM

## 2017-12-10 DIAGNOSIS — E785 Hyperlipidemia, unspecified: Secondary | ICD-10-CM | POA: Diagnosis not present

## 2017-12-10 DIAGNOSIS — I480 Paroxysmal atrial fibrillation: Secondary | ICD-10-CM | POA: Diagnosis not present

## 2017-12-10 DIAGNOSIS — E119 Type 2 diabetes mellitus without complications: Secondary | ICD-10-CM | POA: Diagnosis not present

## 2017-12-10 LAB — COMPREHENSIVE METABOLIC PANEL
ALK PHOS: 72 U/L (ref 39–117)
ALT: 30 U/L (ref 0–53)
AST: 23 U/L (ref 0–37)
Albumin: 4.3 g/dL (ref 3.5–5.2)
BILIRUBIN TOTAL: 0.8 mg/dL (ref 0.2–1.2)
BUN: 19 mg/dL (ref 6–23)
CO2: 27 meq/L (ref 19–32)
CREATININE: 1.32 mg/dL (ref 0.40–1.50)
Calcium: 9.1 mg/dL (ref 8.4–10.5)
Chloride: 101 mEq/L (ref 96–112)
GFR: 60.17 mL/min (ref >60.00)
GLUCOSE: 148 mg/dL — AB (ref 70–99)
Potassium: 3.9 mEq/L (ref 3.5–5.1)
SODIUM: 138 meq/L (ref 135–145)
TOTAL PROTEIN: 7 g/dL (ref 6.0–8.3)

## 2017-12-10 LAB — HEMOGLOBIN A1C: HEMOGLOBIN A1C: 6.4 % (ref 4.6–6.5)

## 2017-12-10 LAB — HM DIABETES FOOT EXAM

## 2017-12-10 LAB — CBC
HCT: 40.3 % (ref 39.0–52.0)
Hemoglobin: 14.1 g/dL (ref 13.0–17.0)
MCHC: 35.1 g/dL (ref 30.0–36.0)
MCV: 83.9 fl (ref 78.0–100.0)
Platelets: 179 10*3/uL (ref 150.0–400.0)
RBC: 4.8 Mil/uL (ref 4.22–5.81)
RDW: 14.4 % (ref 11.5–15.5)
WBC: 8.9 10*3/uL (ref 4.0–10.5)

## 2017-12-10 MED ORDER — ROSUVASTATIN CALCIUM 5 MG PO TABS: 5.0000 mg | ORAL_TABLET | Freq: Every day | ORAL | 3 refills | Status: DC

## 2017-12-10 NOTE — Assessment & Plan Note (Signed)
Not checking but hopefully still good control No complications

## 2017-12-10 NOTE — Assessment & Plan Note (Signed)
No symptoms On xarelto

## 2017-12-10 NOTE — Assessment & Plan Note (Signed)
Doing well with the citalopram

## 2017-12-10 NOTE — Assessment & Plan Note (Signed)
Cognitive problems with atorvastatin Will try low dose rosuvastatin

## 2017-12-10 NOTE — Progress Notes (Signed)
Subjective:    Patient ID: Charles Phelps, male    DOB: [birth year], 52 y.o.   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 cetirizine (ZYRTEC) 10 MG tablet Take 10 mg by mouth daily.      . citalopram (CELEXA) 20 MG tablet TAKE 1 TABLET DAILY 90 tablet 3  . diltiazem (CARDIZEM CD) 120 MG 24 hr capsule TAKE 1 CAPSULE DAILY 90 capsule 3  . hyoscyamine (LEVBID) 0.375 MG 12 hr tablet TAKE 1 TABLET TWICE A DAY AS NEEDED 180 tablet 3  . lisinopril (PRINIVIL,ZESTRIL) 20 MG tablet TAKE 1 TABLET DAILY 90 tablet 3  . metFORMIN (GLUCOPHAGE) 1000 MG tablet TAKE 1 TABLET TWICE A DAY WITH MEALS 180 tablet 3  . Misc Natural Products (BLACK CHERRY CONCENTRATE PO) Take by mouth.    . Multiple Vitamin (MULTIVITAMIN WITH MINERALS) TABS tablet Take 1 tablet by mouth daily.    Marland Kitchen omeprazole (PRILOSEC) 20 MG capsule TAKE 1 CAPSULE DAILY 90 capsule 3  . ONE TOUCH ULTRA TEST test strip USE AS INSTRUCTED TO CHECK BLOOD SUGAR ONCE DAILY 100 each 3  . ONETOUCH DELICA LANCETS FINE MISC USE TO CHECK BLOOD SUGAR ONCE DAILY 200 each 3  . XARELTO 20 MG TABS tablet TAKE 1 TABLET DAILY WITH SUPPER 90 tablet 2   No current facility-administered medications on file prior to visit.     Allergies  Allergen Reactions  . Atorvastatin Other (See Comments)    Memory problems    Past Medical History:  Diagnosis Date  . Allergic rhinitis   . Anxiety   . Elevated LFTs 2000   fatty liver  . GERD (gastroesophageal reflux disease)   . Glucose intolerance (impaired  glucose tolerance)   . Hyperlipidemia   . Hypertension   . IBS (irritable bowel syndrome)   . Obstructive sleep apnea   . Osteoarthritis     History reviewed. No pertinent surgical history.  Family History  Problem Relation Age of Onset  . Hypertension Mother   . Diabetes Mother   . Heart disease Father        CABG at 42  . Cancer Father        lung  . Diabetes Sister   . Diabetes Brother     Social History   Socioeconomic History  . Marital status: Married    Spouse name: Not on file  . Number of children: 1  . Years of education: Not on file  . Highest education level: Not on file  Occupational History  . Occupation: Investment banker, corporate / Administrator: [employer]  . Financial resource strain: Not on file  . Food insecurity:    Worry: Not on file    Inability: Not on file  . Transportation needs:    Medical: Not on file    Non-medical: Not on file  Tobacco Use  . Smoking status: Never Smoker  . Smokeless tobacco: Never Used  Substance and Sexual Activity  . Alcohol use: Yes  Comment: Occasionally  . Drug use: No  . Sexual activity: Not on file  Lifestyle  . Physical activity:    Days per week: Not on file    Minutes per session: Not on file  . Stress: Not on file  Relationships  . Social connections:    Talks on phone: Not on file    Gets together: Not on file    Attends religious service: Not on file    Active member of club or organization: Not on file    Attends meetings of clubs or organizations: Not on file    Relationship status: Not on file  . Intimate partner violence:    Fear of current or ex partner: Not on file    Emotionally abused: Not on file    Physically abused: Not on file    Forced sexual activity: Not on file  Other Topics Concern  . Not on file  Social History Narrative  . Not on file   Review of Systems  Sleep is still not great---awakens in early morning and stays up Wears the CPAP every night No  serious daytime somnolence Heavy allergy season--uses the zyrtec    Objective:   Physical Exam  Constitutional: He appears well-developed. No distress.  Cardiovascular: Normal rate, regular rhythm, normal heart sounds and intact distal pulses. Exam reveals no gallop.  No murmur heard. Pulmonary/Chest: Effort normal and breath sounds normal. No respiratory distress. He has no wheezes. He has no rales.  Abdominal: Soft. There is no tenderness.  Musculoskeletal: He exhibits no edema or tenderness.  Neurological:  Normal sensation in feet  Skin:  No foot lesions  Psychiatric: He has a normal mood and affect. His behavior is normal.          Assessment & Plan:

## 2017-12-11 ENCOUNTER — Encounter: Payer: Self-pay | Admitting: Internal Medicine

## 2017-12-15 ENCOUNTER — Other Ambulatory Visit: Payer: Self-pay | Admitting: Internal Medicine

## 2018-02-09 ENCOUNTER — Other Ambulatory Visit: Payer: Self-pay | Admitting: Internal Medicine

## 2018-03-11 DIAGNOSIS — G4733 Obstructive sleep apnea (adult) (pediatric): Secondary | ICD-10-CM | POA: Diagnosis not present

## 2018-04-02 ENCOUNTER — Ambulatory Visit (INDEPENDENT_AMBULATORY_CARE_PROVIDER_SITE_OTHER): Payer: BLUE CROSS/BLUE SHIELD | Admitting: Internal Medicine

## 2018-04-02 ENCOUNTER — Encounter: Payer: Self-pay | Admitting: Internal Medicine

## 2018-04-02 VITALS — BP 136/84 | HR 75 | Temp 98.3°F | Ht 75.0 in | Wt 285.0 lb

## 2018-04-02 DIAGNOSIS — Z0000 Encounter for general adult medical examination without abnormal findings: Secondary | ICD-10-CM

## 2018-04-02 DIAGNOSIS — I480 Paroxysmal atrial fibrillation: Secondary | ICD-10-CM | POA: Diagnosis not present

## 2018-04-02 DIAGNOSIS — E119 Type 2 diabetes mellitus without complications: Secondary | ICD-10-CM | POA: Diagnosis not present

## 2018-04-02 DIAGNOSIS — Z1211 Encounter for screening for malignant neoplasm of colon: Secondary | ICD-10-CM

## 2018-04-02 DIAGNOSIS — F39 Unspecified mood [affective] disorder: Secondary | ICD-10-CM

## 2018-04-02 NOTE — Progress Notes (Signed)
Subjective:    Patient ID: Charles Phelps, male    DOB: 1965-08-26, 52 y.o.   MRN: [id number]  HPI Here for physical Laid off job of 35 years ----he felt they just wanted someone who didn't make as much Did find another job--still doing sales Did gain back a few pounds when he was idle  Mood still okay Continues on the medication  Checks sugars sporadically Thinks things are okay No foot numbness or pain Keeps up with eye exams  Current Outpatient Medications on File Prior to Visit  Medication Sig Dispense Refill  . cetirizine (ZYRTEC) 10 MG tablet Take 10 mg by mouth daily.      . citalopram (CELEXA) 20 MG tablet TAKE 1 TABLET DAILY 90 tablet 3  . diltiazem (CARDIZEM CD) 120 MG 24 hr capsule TAKE 1 CAPSULE DAILY 90 capsule 3  . hyoscyamine (LEVBID) 0.375 MG 12 hr tablet TAKE 1 TABLET TWICE A DAY AS NEEDED 180 tablet 3  . lisinopril (PRINIVIL,ZESTRIL) 20 MG tablet TAKE 1 TABLET DAILY 90 tablet 3  . metFORMIN (GLUCOPHAGE) 1000 MG tablet TAKE 1 TABLET TWICE A DAY WITH MEALS 180 tablet 3  . Misc Natural Products (BLACK CHERRY CONCENTRATE PO) Take by mouth.    . Multiple Vitamin (MULTIVITAMIN WITH MINERALS) TABS tablet Take 1 tablet by mouth daily.    Marland Kitchen omeprazole (PRILOSEC) 20 MG capsule TAKE 1 CAPSULE DAILY 90 capsule 3  . ONE TOUCH ULTRA TEST test strip USE AS INSTRUCTED TO CHECK BLOOD SUGAR ONCE DAILY 100 each 3  . ONETOUCH DELICA LANCETS FINE MISC USE TO CHECK BLOOD SUGAR ONCE DAILY 200 each 3  . rosuvastatin (CRESTOR) 5 MG tablet Take 1 tablet (5 mg total) by mouth daily. 90 tablet 3  . XARELTO 20 MG TABS tablet TAKE 1 TABLET DAILY WITH SUPPER 90 tablet 2   No current facility-administered medications on file prior to visit.     Allergies  Allergen Reactions  . Atorvastatin Other (See Comments)    Memory problems    Past Medical History:  Diagnosis Date  . Allergic rhinitis   . Anxiety   . Elevated LFTs 2000   fatty liver  . GERD (gastroesophageal reflux  disease)   . Glucose intolerance (impaired glucose tolerance)   . Hyperlipidemia   . Hypertension   . IBS (irritable bowel syndrome)   . Obstructive sleep apnea   . Osteoarthritis     History reviewed. No pertinent surgical history.  Family History  Problem Relation Age of Onset  . Hypertension Mother   . Diabetes Mother   . Heart disease Father        CABG at 31  . Cancer Father        lung  . Diabetes Sister   . Diabetes Brother     Social History   Socioeconomic History  . Marital status: Married    Spouse name: Not on file  . Number of children: 1  . Years of education: Not on file  . Highest education level: Not on file  Occupational History  . Occupation: Investment banker, corporate / Geologist, engineering   Social Needs  . Financial resource strain: Not on file  . Food insecurity:    Worry: Not on file    Inability: Not on file  . Transportation needs:    Medical: Not on file    Non-medical: Not on file  Tobacco Use  . Smoking status: Never Smoker  . Smokeless tobacco: Never Used  Substance and  Sexual Activity  . Alcohol use: Yes    Comment: Occasionally  . Drug use: No  . Sexual activity: Not on file  Lifestyle  . Physical activity:    Days per week: Not on file    Minutes per session: Not on file  . Stress: Not on file  Relationships  . Social connections:    Talks on phone: Not on file    Gets together: Not on file    Attends religious service: Not on file    Active member of club or organization: Not on file    Attends meetings of clubs or organizations: Not on file    Relationship status: Not on file  . Intimate partner violence:    Fear of current or ex partner: Not on file    Emotionally abused: Not on file    Physically abused: Not on file    Forced sexual activity: Not on file  Other Topics Concern  . Not on file  Social History Narrative  . Not on file   Review of Systems  Constitutional: Negative for fatigue and unexpected weight change.        Wears seat belt  HENT: Negative for tinnitus and trouble swallowing.        Keeps up with dentist Mild hearing loss  Eyes: Negative for visual disturbance.       No diplopia or unilateral vision loss  Respiratory: Negative for chest tightness and shortness of breath.        Occ cough with medication?   Cardiovascular: Negative for chest pain, palpitations and leg swelling.  Gastrointestinal: Negative for blood in stool and constipation.       No heartburn  Endocrine: Negative for polydipsia and polyuria.  Genitourinary: Negative for difficulty urinating and frequency.       No sexual problems  Musculoskeletal: Positive for arthralgias. Negative for back pain and joint swelling.       Mild knee pain---biofreeze helps  Skin: Negative for rash.  Allergic/Immunologic: Negative for immunocompromised state.       Uses cetirizine mostly  Neurological: Negative for dizziness, syncope, light-headedness and headaches.  Hematological: Negative for adenopathy. Does not bruise/bleed easily.  Psychiatric/Behavioral: Negative for dysphoric mood and sleep disturbance.       Sleeps well with CPAP---every night       Objective:   Physical Exam  Constitutional: He is oriented to person, place, and time. No distress.  HENT:  Head: Normocephalic and atraumatic.  Right Ear: External ear normal.  Left Ear: External ear normal.  Mouth/Throat: Oropharynx is clear and moist. No oropharyngeal exudate.  Eyes: Pupils are equal, round, and reactive to light. Conjunctivae are normal.  Neck: No thyromegaly present.  Cardiovascular: Normal rate, regular rhythm, normal heart sounds and intact distal pulses. Exam reveals no gallop.  No murmur heard. Respiratory: Effort normal and breath sounds normal. No respiratory distress. He has no wheezes. He has no rales.  GI: Soft. There is no tenderness.  Musculoskeletal: He exhibits no edema or tenderness.  Lymphadenopathy:    He has no cervical adenopathy.    Neurological: He is alert and oriented to person, place, and time.  Skin: No rash noted. No erythema.  No foot lesions  Psychiatric: He has a normal mood and affect. His behavior is normal.           Assessment & Plan:

## 2018-04-02 NOTE — Assessment & Plan Note (Signed)
Healthy Needs to work on fitness Prefers no flu vaccine Will do FIT Defer PSA till 22

## 2018-04-02 NOTE — Assessment & Plan Note (Signed)
Hopefully still good control No complications

## 2018-04-02 NOTE — Assessment & Plan Note (Signed)
Still regular  On xarelto 

## 2018-04-02 NOTE — Assessment & Plan Note (Signed)
Doing okay despite recent need to get new job

## 2018-04-03 LAB — COMPREHENSIVE METABOLIC PANEL
ALBUMIN: 4.5 g/dL (ref 3.5–5.2)
ALK PHOS: 77 U/L (ref 39–117)
ALT: 45 U/L (ref 0–53)
AST: 35 U/L (ref 0–37)
BILIRUBIN TOTAL: 0.9 mg/dL (ref 0.2–1.2)
BUN: 18 mg/dL (ref 6–23)
CALCIUM: 9.4 mg/dL (ref 8.4–10.5)
CO2: 31 mEq/L (ref 19–32)
Chloride: 100 mEq/L (ref 96–112)
Creatinine, Ser: 1.31 mg/dL (ref 0.40–1.50)
GFR: 60.63 mL/min (ref >60.00)
Glucose, Bld: 163 mg/dL — ABNORMAL HIGH (ref 70–99)
Potassium: 4 mEq/L (ref 3.5–5.1)
Sodium: 138 mEq/L (ref 135–145)
Total Protein: 7 g/dL (ref 6.0–8.3)

## 2018-04-03 LAB — CBC
HEMATOCRIT: 39.3 % (ref 39.0–52.0)
HEMOGLOBIN: 14 g/dL (ref 13.0–17.0)
MCHC: 35.7 g/dL (ref 30.0–36.0)
MCV: 84 fl (ref 78.0–100.0)
Platelets: 190 10*3/uL (ref 150.0–400.0)
RBC: 4.68 Mil/uL (ref 4.22–5.81)
RDW: 13.9 % (ref 11.5–15.5)
WBC: 8.9 10*3/uL (ref 4.0–10.5)

## 2018-04-03 LAB — LIPID PANEL
CHOLESTEROL: 145 mg/dL (ref 0–200)
HDL: 40 mg/dL (ref >39.00)
Total CHOL/HDL Ratio: 4

## 2018-04-03 LAB — HEMOGLOBIN A1C: Hgb A1c MFr Bld: 7 % — ABNORMAL HIGH (ref 4.6–6.5)

## 2018-04-03 LAB — LDL CHOLESTEROL, DIRECT: LDL DIRECT: 78 mg/dL

## 2018-05-12 IMAGING — CR DG CHEST 2V
2 series · 2 of 2 positions shown · non-contrast
Comparison: Chest CT dated 07/20/2014

CLINICAL DATA: 50-year-old male with chest pain.

EXAM:
CHEST  2 VIEW

[chest pa]
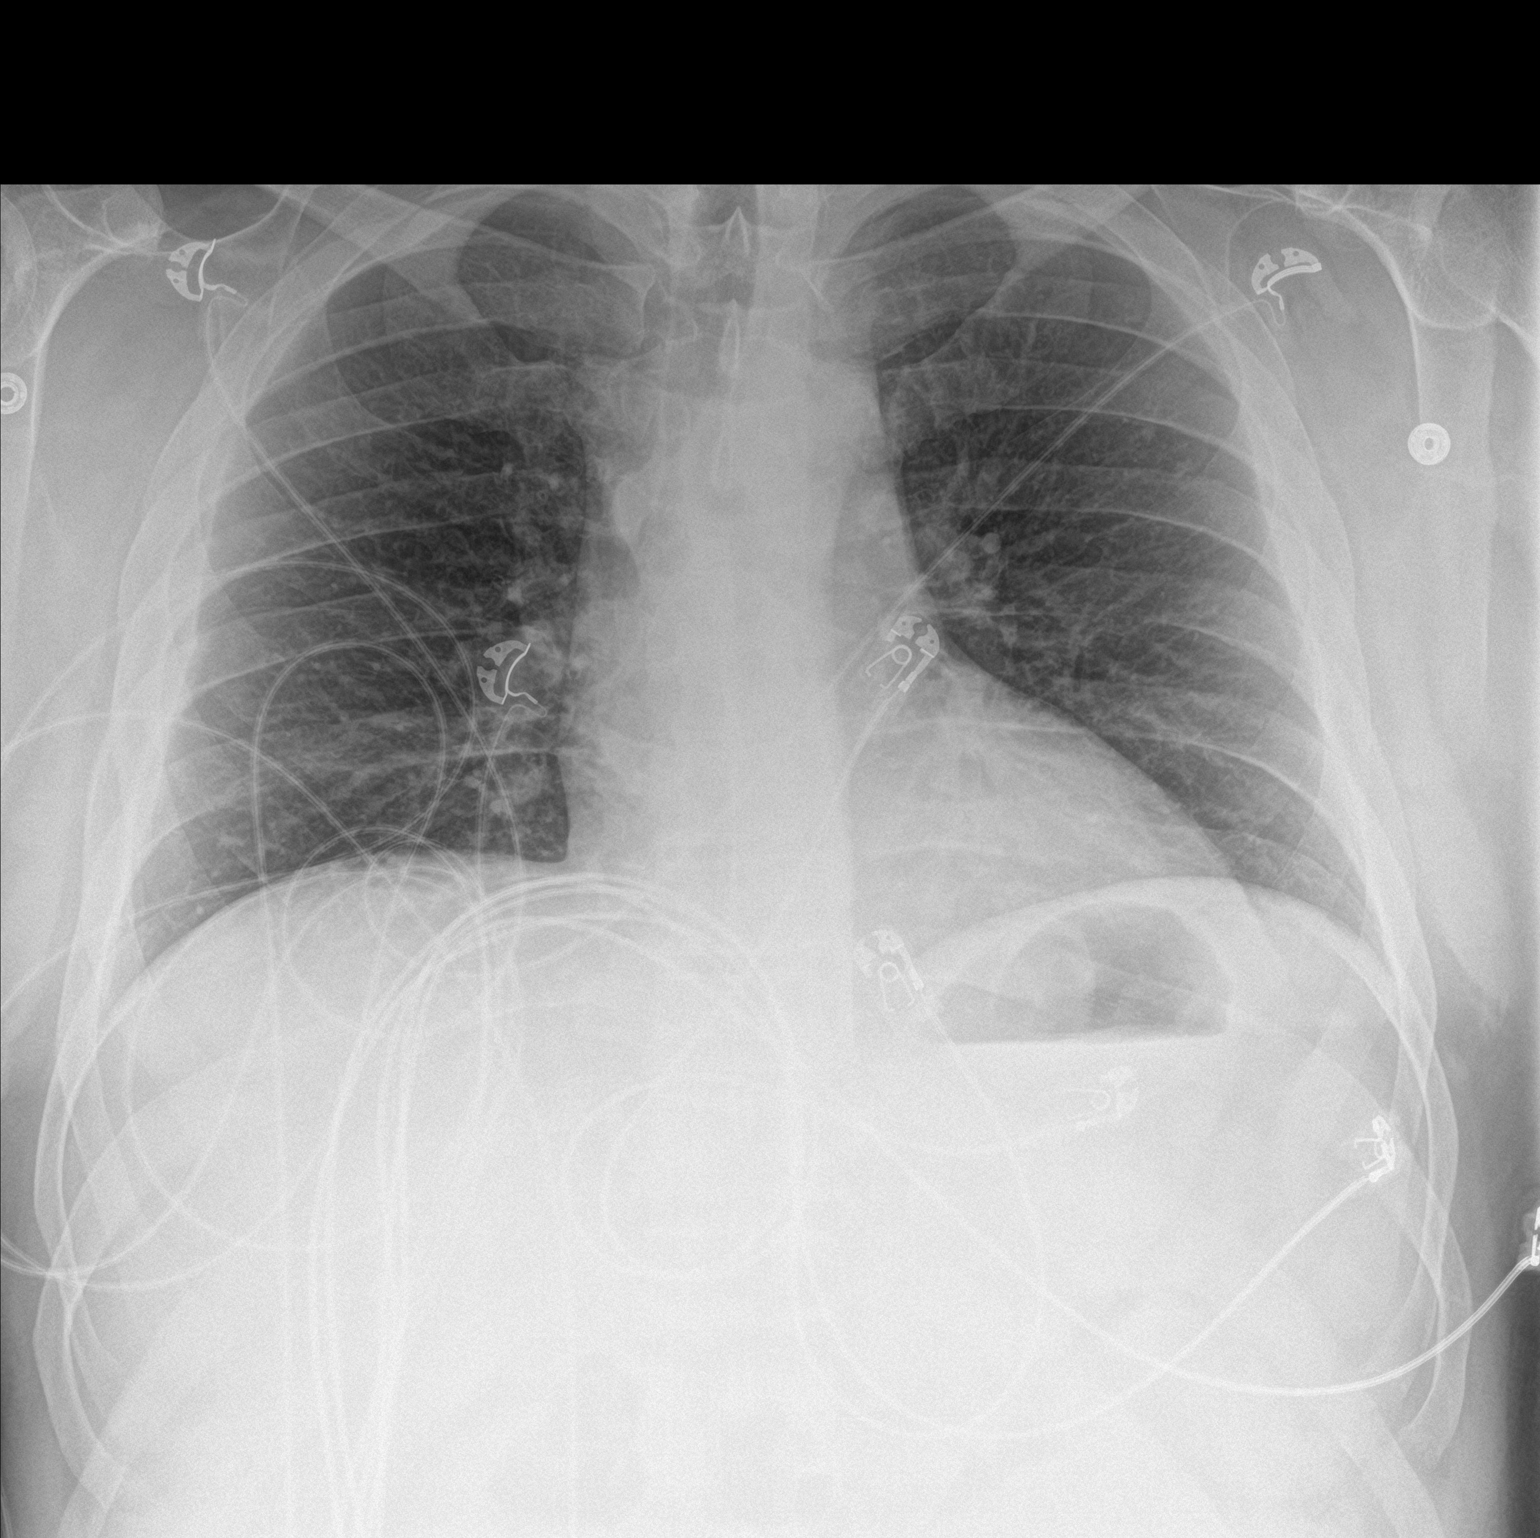

[chest lat]
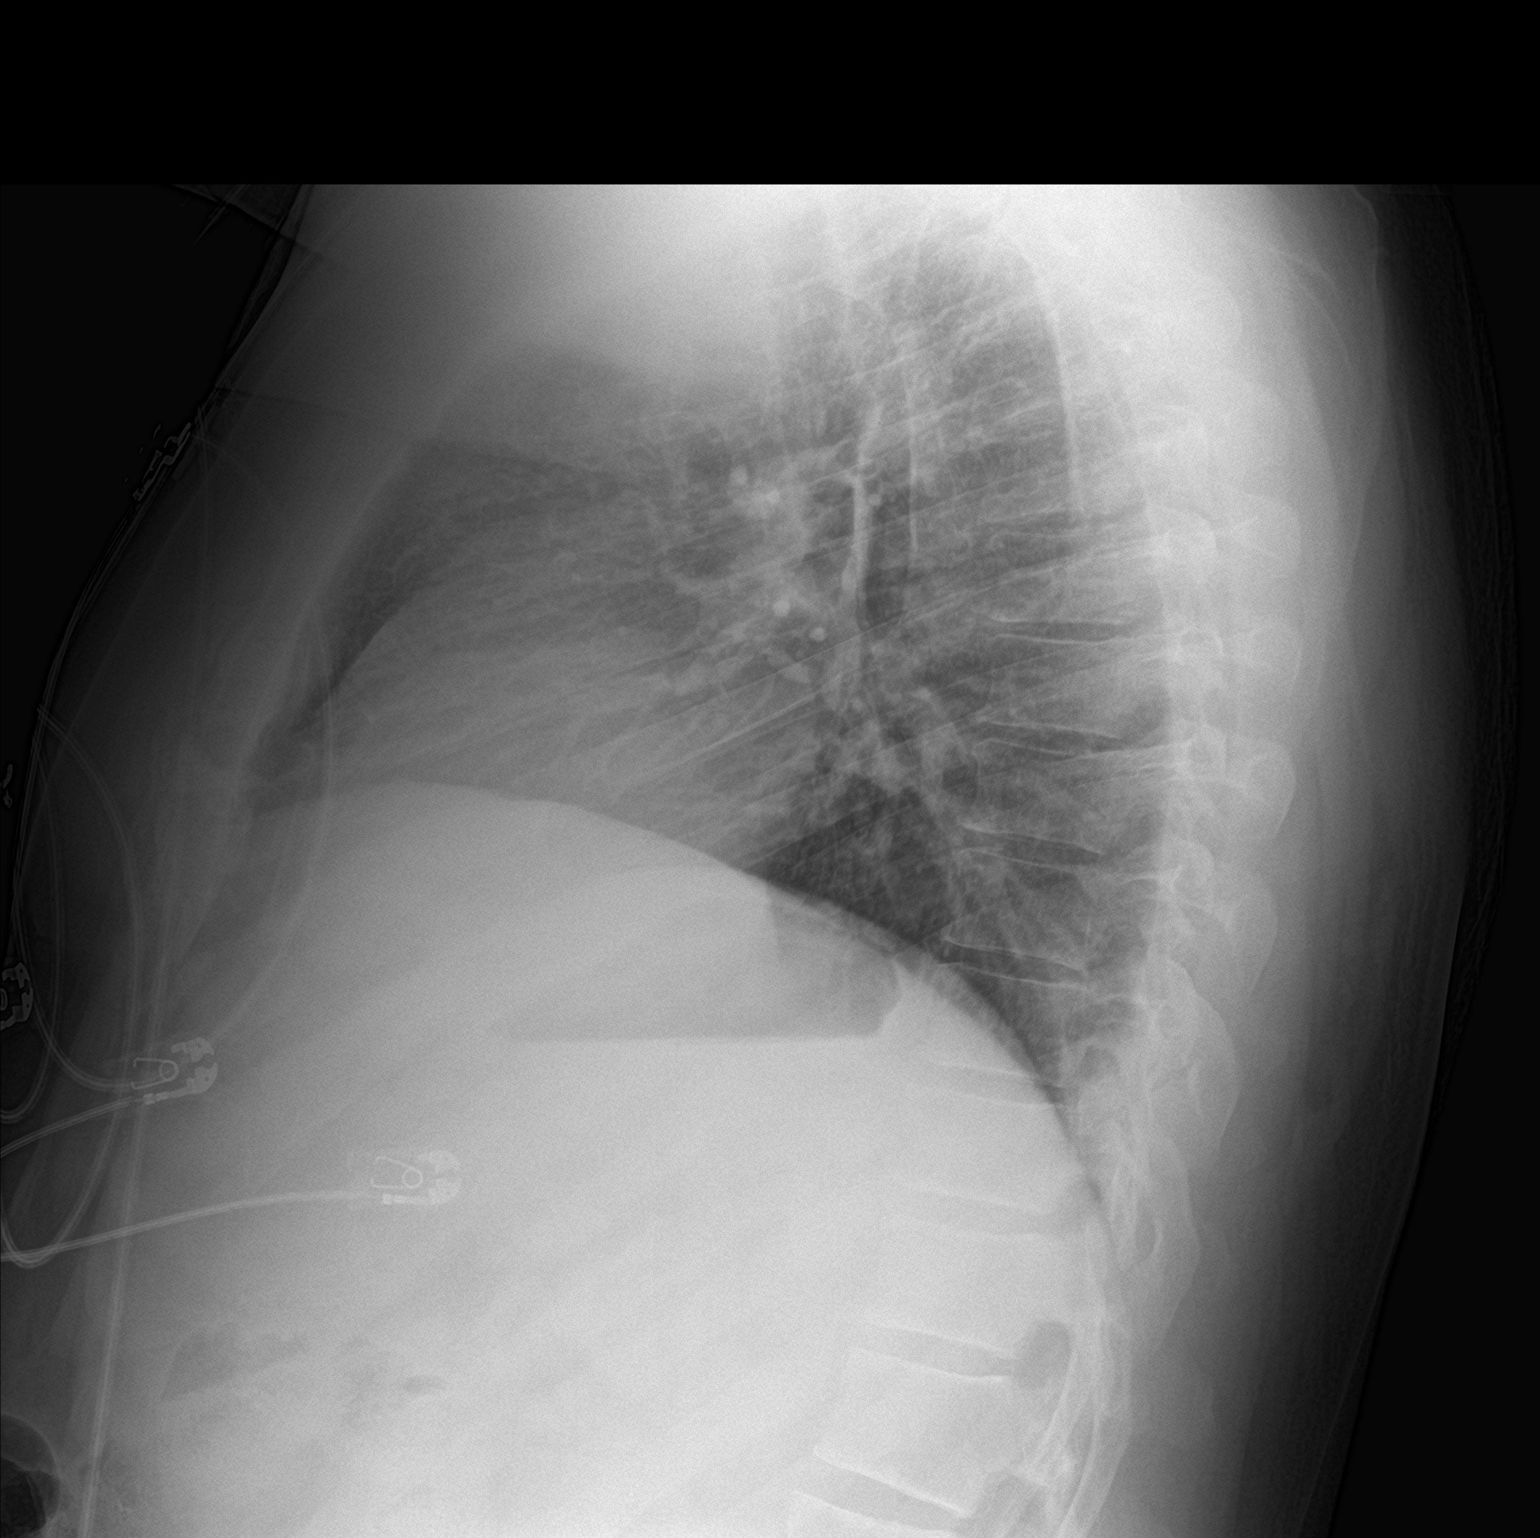

[2 of 2 positions shown; findings below may reference images not displayed]

FINDINGS: The heart size and mediastinal contours are within normal limits.
Both lungs are clear. The visualized skeletal structures are
unremarkable.
IMPRESSION: No active cardiopulmonary disease.

## 2018-07-05 ENCOUNTER — Other Ambulatory Visit: Payer: Self-pay | Admitting: Internal Medicine

## 2018-07-12 MED ORDER — OSELTAMIVIR PHOSPHATE 75 MG PO CAPS: 75.0000 mg | ORAL_CAPSULE | Freq: Every day | ORAL | 0 refills | Status: DC

## 2018-09-11 ENCOUNTER — Other Ambulatory Visit: Payer: Self-pay | Admitting: Internal Medicine

## 2018-10-05 ENCOUNTER — Ambulatory Visit: Payer: BLUE CROSS/BLUE SHIELD | Admitting: Internal Medicine

## 2018-11-17 ENCOUNTER — Other Ambulatory Visit: Payer: Self-pay | Admitting: Internal Medicine

## 2018-12-07 ENCOUNTER — Other Ambulatory Visit (HOSPITAL_COMMUNITY): Payer: Self-pay | Admitting: Internal Medicine

## 2018-12-10 ENCOUNTER — Other Ambulatory Visit: Payer: Self-pay | Admitting: Internal Medicine

## 2018-12-18 DIAGNOSIS — E119 Type 2 diabetes mellitus without complications: Secondary | ICD-10-CM | POA: Diagnosis not present

## 2018-12-31 ENCOUNTER — Ambulatory Visit (INDEPENDENT_AMBULATORY_CARE_PROVIDER_SITE_OTHER): Payer: BC Managed Care – PPO | Admitting: Internal Medicine

## 2018-12-31 ENCOUNTER — Encounter: Payer: Self-pay | Admitting: Internal Medicine

## 2018-12-31 ENCOUNTER — Other Ambulatory Visit: Payer: Self-pay

## 2018-12-31 VITALS — BP 138/90 | HR 77 | Temp 98.7°F | Ht 75.0 in | Wt 280.0 lb

## 2018-12-31 DIAGNOSIS — F39 Unspecified mood [affective] disorder: Secondary | ICD-10-CM

## 2018-12-31 DIAGNOSIS — E119 Type 2 diabetes mellitus without complications: Secondary | ICD-10-CM

## 2018-12-31 DIAGNOSIS — I480 Paroxysmal atrial fibrillation: Secondary | ICD-10-CM

## 2018-12-31 DIAGNOSIS — I10 Essential (primary) hypertension: Secondary | ICD-10-CM | POA: Diagnosis not present

## 2018-12-31 LAB — POCT GLYCOSYLATED HEMOGLOBIN (HGB A1C): Hemoglobin A1C: 7.3 % — AB (ref 4.0–5.6)

## 2018-12-31 LAB — HM DIABETES FOOT EXAM

## 2018-12-31 MED ORDER — RIVAROXABAN 20 MG PO TABS: ORAL_TABLET | ORAL | 3 refills | Status: DC

## 2018-12-31 MED ORDER — METFORMIN HCL 1000 MG PO TABS: 1000.0000 mg | ORAL_TABLET | Freq: Two times a day (BID) | ORAL | 3 refills | Status: DC

## 2018-12-31 MED ORDER — OMEPRAZOLE 20 MG PO CPDR: 20.0000 mg | DELAYED_RELEASE_CAPSULE | Freq: Every day | ORAL | 3 refills | Status: DC

## 2018-12-31 MED ORDER — DILTIAZEM HCL ER COATED BEADS 120 MG PO CP24: 120.0000 mg | ORAL_CAPSULE | Freq: Every day | ORAL | 3 refills | Status: DC

## 2018-12-31 MED ORDER — LISINOPRIL 20 MG PO TABS: 20.0000 mg | ORAL_TABLET | Freq: Every day | ORAL | 3 refills | Status: DC

## 2018-12-31 MED ORDER — ROSUVASTATIN CALCIUM 5 MG PO TABS: 5.0000 mg | ORAL_TABLET | Freq: Every day | ORAL | 3 refills | Status: DC

## 2018-12-31 MED ORDER — CITALOPRAM HYDROBROMIDE 20 MG PO TABS: 20.0000 mg | ORAL_TABLET | Freq: Every day | ORAL | 3 refills | Status: DC

## 2018-12-31 NOTE — Addendum Note (Signed)
Addended by: Pilar Grammes on: 12/31/2018 04:05 PM   Modules accepted: Orders

## 2018-12-31 NOTE — Assessment & Plan Note (Signed)
No sig change Continues on the SSRI

## 2018-12-31 NOTE — Assessment & Plan Note (Signed)
No palpitations On xarelto

## 2018-12-31 NOTE — Assessment & Plan Note (Signed)
BP Readings from Last 3 Encounters:  12/31/18 138/90  04/02/18 136/84  12/10/17 128/88   Good control Discussed lifestyle measures

## 2018-12-31 NOTE — Progress Notes (Signed)
Subjective:    Patient ID: Charles Phelps, male    DOB: 1966-06-06, 53 y.o.   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 cetirizine (ZYRTEC) 10 MG tablet Take 10 mg by mouth daily.      . citalopram (CELEXA) 20 MG tablet TAKE 1 TABLET DAILY 90 tablet 3  . diltiazem (CARDIZEM CD) 120 MG 24 hr capsule TAKE 1 CAPSULE DAILY 90 capsule 0  . hyoscyamine (LEVBID) 0.375 MG 12 hr tablet TAKE 1 TABLET TWICE A DAY AS NEEDED 180 tablet 3  . lisinopril (PRINIVIL,ZESTRIL) 20 MG tablet TAKE 1 TABLET DAILY 90 tablet 3  . metFORMIN (GLUCOPHAGE) 1000 MG tablet TAKE 1 TABLET TWICE A DAY WITH MEALS 180 tablet 3  . Misc Natural Products (BLACK CHERRY CONCENTRATE PO) Take by mouth.    . Multiple Vitamin (MULTIVITAMIN WITH MINERALS) TABS tablet Take 1 tablet by mouth daily.    Marland Kitchen omeprazole (PRILOSEC) 20 MG capsule TAKE 1 CAPSULE DAILY 90 capsule 3  . rosuvastatin (CRESTOR) 5 MG tablet TAKE 1 TABLET DAILY 30 tablet 0  . XARELTO 20 MG TABS tablet TAKE 1 TABLET DAILY WITH SUPPER 90 tablet 2   No current facility-administered medications on file prior to visit.     Allergies  Allergen Reactions  . Atorvastatin Other (See Comments)    Memory problems    Past Medical History:  Diagnosis Date  . Allergic rhinitis   . Anxiety   . Elevated LFTs 2000   fatty liver  . GERD (gastroesophageal reflux disease)   . Glucose intolerance (impaired glucose tolerance)   . Hyperlipidemia   . Hypertension   . IBS  (irritable bowel syndrome)   . Obstructive sleep apnea   . Osteoarthritis     History reviewed. No pertinent surgical history.  Family History  Problem Relation Age of Onset  . Hypertension Mother   . Diabetes Mother   . Heart disease Father        CABG at 5  . Cancer Father        lung  . Diabetes Sister   . Diabetes Brother     Social History   Socioeconomic History  . Marital status: Married    Spouse name: Not on file  . Number of children: 1  . Years of education: Not on file  . Highest education level: Not on file  Occupational History  . Occupation: Investment banker, corporate / Geologist, engineering   Social Needs  . Financial resource strain: Not on file  . Food insecurity:    Worry: Not on file    Inability: Not on file  . Transportation needs:    Medical: Not on file    Non-medical: Not on file  Tobacco Use  . Smoking status: Never Smoker  . Smokeless tobacco: Never Used  Substance and Sexual Activity  . Alcohol use: Yes    Comment: Occasionally  . Drug use: No  . Sexual activity: Not on file  Lifestyle  .  Physical activity:    Days per week: Not on file    Minutes per session: Not on file  . Stress: Not on file  Relationships  . Social connections:    Talks on phone: Not on file    Gets together: Not on file    Attends religious service: Not on file    Active member of club or organization: Not on file    Attends meetings of clubs or organizations: Not on file    Relationship status: Not on file  . Intimate partner violence:    Fear of current or ex partner: Not on file    Emotionally abused: Not on file    Physically abused: Not on file    Forced sexual activity: Not on file  Other Topics Concern  . Not on file  Social History Narrative  . Not on file   Review of Systems Had lost the FIT kit--but just found it. Will do it now Appetite is fine Weight down slightly Sleeps pretty well    Objective:   Physical Exam  Constitutional: He appears  well-developed. No distress.  Neck: No thyromegaly present.  Cardiovascular: Normal rate, regular rhythm, normal heart sounds and intact distal pulses. Exam reveals no gallop.  No murmur heard. Respiratory: Effort normal and breath sounds normal. No respiratory distress. He has no wheezes. He has no rales.  GI: Soft. There is no abdominal tenderness.  Musculoskeletal:        General: No tenderness or edema.  Lymphadenopathy:    He has no cervical adenopathy.  Neurological:  Normal sensation in feet  Skin: No erythema.  No foot ulcers or lesions  Psychiatric: He has a normal mood and affect. His behavior is normal.           Assessment & Plan:

## 2018-12-31 NOTE — Assessment & Plan Note (Signed)
Lab Results  Component Value Date   HGBA1C 7.3 (A) 12/31/2018   Still with acceptable control Discussed exercise and ongoing consistent weight loss

## 2019-03-10 ENCOUNTER — Other Ambulatory Visit: Payer: Self-pay | Admitting: Internal Medicine

## 2019-03-10 NOTE — Telephone Encounter (Signed)
Spoke to pt. He said he did not need the strips and lancets.

## 2019-03-22 ENCOUNTER — Encounter: Payer: Self-pay | Admitting: Internal Medicine

## 2019-03-22 DIAGNOSIS — M109 Gout, unspecified: Secondary | ICD-10-CM | POA: Insufficient documentation

## 2019-03-22 NOTE — Telephone Encounter (Signed)
Spoke to him on the phone Inflamed painful area is over insertion of Achilles tendon Discussed that that is not very likely to be gout Ice does help Okay to take 1-2 doses of advil or aleve tonight to see if that helps  Set up appt for noon tomorrow Carrie--please add him on

## 2019-03-22 NOTE — Telephone Encounter (Signed)
Do you want me to add him on tomorrow morning?

## 2019-03-23 ENCOUNTER — Other Ambulatory Visit: Payer: Self-pay

## 2019-03-23 ENCOUNTER — Ambulatory Visit: Payer: BC Managed Care – PPO | Admitting: Internal Medicine

## 2019-03-23 ENCOUNTER — Encounter: Payer: Self-pay | Admitting: Internal Medicine

## 2019-03-23 DIAGNOSIS — M7662 Achilles tendinitis, left leg: Secondary | ICD-10-CM | POA: Diagnosis not present

## 2019-03-23 LAB — RENAL FUNCTION PANEL
Albumin: 4.6 g/dL (ref 3.5–5.2)
BUN: 24 mg/dL — ABNORMAL HIGH (ref 6–23)
CO2: 30 mEq/L (ref 19–32)
Calcium: 9.3 mg/dL (ref 8.4–10.5)
Chloride: 102 mEq/L (ref 96–112)
Creatinine, Ser: 1.36 mg/dL (ref 0.40–1.50)
GFR: 54.43 mL/min — ABNORMAL LOW (ref >60.00)
Glucose, Bld: 140 mg/dL — ABNORMAL HIGH (ref 70–99)
Phosphorus: 2.8 mg/dL (ref 2.3–4.6)
Potassium: 4.2 mEq/L (ref 3.5–5.1)
Sodium: 140 mEq/L (ref 135–145)

## 2019-03-23 LAB — URIC ACID: Uric Acid, Serum: 6.7 mg/dL (ref 4.0–7.8)

## 2019-03-23 NOTE — Progress Notes (Signed)
Subjective:    Patient ID: Charles Phelps, male    DOB: 1966-02-10, 53 y.o.   MRN: [id number]  HPI Here due to left Achilles pain  Started about a month ago Really bad in the past couple of days Did try ibuprofen yesterday at my direction (limited trial) Is some better  Saw ortho some years ago due to right Achilles pain Recommended "cleaning out" scar tissue--but he didn't do it Swelling persists but wasn't painful till recently--now on left  Current Outpatient Medications on File Prior to Visit  Medication Sig Dispense Refill  . cetirizine (ZYRTEC) 10 MG tablet Take 10 mg by mouth daily.      . citalopram (CELEXA) 20 MG tablet Take 1 tablet (20 mg total) by mouth daily. 90 tablet 3  . diltiazem (CARDIZEM CD) 120 MG 24 hr capsule Take 1 capsule (120 mg total) by mouth daily. 90 capsule 3  . hyoscyamine (LEVBID) 0.375 MG 12 hr tablet TAKE 1 TABLET TWICE A DAY AS NEEDED 180 tablet 3  . lisinopril (ZESTRIL) 20 MG tablet Take 1 tablet (20 mg total) by mouth daily. 90 tablet 3  . metFORMIN (GLUCOPHAGE) 1000 MG tablet Take 1 tablet (1,000 mg total) by mouth 2 (two) times daily with a meal. 180 tablet 3  . Misc Natural Products (BLACK CHERRY CONCENTRATE PO) Take by mouth.    . Multiple Vitamin (MULTIVITAMIN WITH MINERALS) TABS tablet Take 1 tablet by mouth daily.    Marland Kitchen omeprazole (PRILOSEC) 20 MG capsule Take 1 capsule (20 mg total) by mouth daily. 90 capsule 3  . rivaroxaban (XARELTO) 20 MG TABS tablet TAKE 1 TABLET DAILY WITH SUPPER 90 tablet 3  . rosuvastatin (CRESTOR) 5 MG tablet Take 1 tablet (5 mg total) by mouth daily. 90 tablet 3   No current facility-administered medications on file prior to visit.     Allergies  Allergen Reactions  . Atorvastatin Other (See Comments)    Memory problems    Past Medical History:  Diagnosis Date  . Allergic rhinitis   . Anxiety   . Elevated LFTs 2000   fatty liver  . GERD (gastroesophageal reflux disease)   . Glucose intolerance  (impaired glucose tolerance)   . Gout   . Hyperlipidemia   . Hypertension   . IBS (irritable bowel syndrome)   . Obstructive sleep apnea   . Osteoarthritis     History reviewed. No pertinent surgical history.  Family History  Problem Relation Age of Onset  . Hypertension Mother   . Diabetes Mother   . Heart disease Father        CABG at 8  . Cancer Father        lung  . Diabetes Sister   . Diabetes Brother     Social History   Socioeconomic History  . Marital status: Married    Spouse name: Not on file  . Number of children: 1  . Years of education: Not on file  . Highest education level: Not on file  Occupational History  . Occupation: Investment banker, corporate / Geologist, engineering   Social Needs  . Financial resource strain: Not on file  . Food insecurity    Worry: Not on file    Inability: Not on file  . Transportation needs    Medical: Not on file    Non-medical: Not on file  Tobacco Use  . Smoking status: Never Smoker  . Smokeless tobacco: Never Used  Substance and Sexual Activity  . Alcohol use:  Yes    Comment: Occasionally  . Drug use: No  . Sexual activity: Not on file  Lifestyle  . Physical activity    Days per week: Not on file    Minutes per session: Not on file  . Stress: Not on file  Relationships  . Social Herbalist on phone: Not on file    Gets together: Not on file    Attends religious service: Not on file    Active member of club or organization: Not on file    Attends meetings of clubs or organizations: Not on file    Relationship status: Not on file  . Intimate partner violence    Fear of current or ex partner: Not on file    Emotionally abused: Not on file    Physically abused: Not on file    Forced sexual activity: Not on file  Other Topics Concern  . Not on file  Social History Narrative  . Not on file   Review of Systems  No fever No other joint problems     Objective:   Physical Exam  Constitutional: He appears  well-developed. No distress.  Musculoskeletal:     Comments: Thickening of both Achilles tendons at insertion site Tenderness on left--mild and only pale red (much improved with the ibuprofen) No clear tophus           Assessment & Plan:

## 2019-03-23 NOTE — Assessment & Plan Note (Signed)
No apparent tophus and no other changes of gout in other joints Highly unlikely to be gouty Will check labs  Okay to continue the ibuprofen temporarily since is on omeprazole as well Ice also Referral to Dr Lorelei Pont

## 2019-03-24 ENCOUNTER — Ambulatory Visit: Payer: BC Managed Care – PPO | Admitting: Family Medicine

## 2019-03-24 ENCOUNTER — Encounter: Payer: Self-pay | Admitting: Family Medicine

## 2019-03-24 VITALS — BP 140/90 | HR 75 | Temp 98.2°F | Ht 75.0 in | Wt 277.5 lb

## 2019-03-24 DIAGNOSIS — M7662 Achilles tendinitis, left leg: Secondary | ICD-10-CM

## 2019-03-24 MED ORDER — NITROGLYCERIN 0.2 MG/HR TD PT24: MEDICATED_PATCH | TRANSDERMAL | 2 refills | Status: DC

## 2019-03-24 NOTE — Patient Instructions (Signed)
Achilles Rehab  Calf raises while seated First lower on 2 feet, then can transition to 1 foot  Begin with 3 sets of 10 repetitions  Increase by 5 repetitions every 3 days  Goal is 3 sets of 30 repetitions  Add backpack with 5 lbs Increase by 5 lbs per week to max of 30 lbs   WHEN this is easy do it standing Do with both knee straight and knee at 20 degrees of flexion

## 2019-03-24 NOTE — Progress Notes (Signed)
Charles Ryland T. Alyx Gee, MD Primary Care and [place] at [facility] [place], [address] Phone: [phone]  FAX: [phone]  [name] - 53 y.o. male  MRN [id number]  Date of Birth: 07-04-1966  Visit Date: 03/24/2019  PCP: Venia Carbon, MD  Referred by: Venia Carbon, MD  Chief Complaint  Patient presents with  . Foot Pain    Left   Subjective:   Charles Phelps is a 53 y.o. very pleasant male patient with Body mass index is 34.69 kg/m. who presents with the following:  He presents with a one-month history of left-sided heel pain.  Previously he has a significantly long course of right-sided Achilles tendinopathy.  Today presents and is quite frustrated.  Pain is on the posterior aspect of the heel.  Nothing on the bottom.  He does have a painful swollen nodule on the back.  He is not in any kind of traumatic injury at all.  Left sided posterior heel pain.  Tried to play golf on Saturday and had a hard time on Sunday.   Motrin did seem to help.  Was hurting at last at the beginning of august.  Walking a little and it would hurt.      Past Medical History, Surgical History, Social History, Family History, Problem List, Medications, and Allergies have been reviewed and updated if relevant.  Patient Active Problem List   Diagnosis Date Noted  . Tendonitis, Achilles, left 03/23/2019  . Gout   . Moderate obesity 10/01/2017  . Paroxysmal atrial fibrillation ([place]) 01/30/2017  . OSA (obstructive sleep apnea) 08/29/2014  . Gout, unspecified 09/03/2011  . Diabetes mellitus type 2, controlled, without complications ([place]) [id number]  . Routine general medical examination at a health care facility 02/01/2011  . IBS 02/27/2009  . Essential hypertension, benign 01/27/2009  . OSTEOARTHRITIS 02/21/2007  . Hyperlipemia 02/17/2007  . Mood disorder ([place]) 02/17/2007  . Allergic rhinitis 02/17/2007  . GERD  02/17/2007  . ARTHRITIS, LEFT HIP 02/17/2007    Past Medical History:  Diagnosis Date  . Allergic rhinitis   . Anxiety   . Elevated LFTs 2000   fatty liver  . GERD (gastroesophageal reflux disease)   . Glucose intolerance (impaired glucose tolerance)   . Gout   . Hyperlipidemia   . Hypertension   . IBS (irritable bowel syndrome)   . Obstructive sleep apnea   . Osteoarthritis     History reviewed. No pertinent surgical history.  Social History   Socioeconomic History  . Marital status: Married    Spouse name: Not on file  . Number of children: 1  . Years of education: Not on file  . Highest education level: Not on file  Occupational History  . Occupation: Investment banker, corporate / Geologist, engineering   Social Needs  . Financial resource strain: Not on file  . Food insecurity    Worry: Not on file    Inability: Not on file  . Transportation needs    Medical: Not on file    Non-medical: Not on file  Tobacco Use  . Smoking status: Never Smoker  . Smokeless tobacco: Never Used  Substance and Sexual Activity  . Alcohol use: Yes    Comment: Occasionally  . Drug use: No  . Sexual activity: Not on file  Lifestyle  . Physical activity    Days per week: Not on file    Minutes per session: Not on file  .  Stress: Not on file  Relationships  . Social Herbalist on phone: Not on file    Gets together: Not on file    Attends religious service: Not on file    Active member of club or organization: Not on file    Attends meetings of clubs or organizations: Not on file    Relationship status: Not on file  . Intimate partner violence    Fear of current or ex partner: Not on file    Emotionally abused: Not on file    Physically abused: Not on file    Forced sexual activity: Not on file  Other Topics Concern  . Not on file  Social History Narrative  . Not on file    Family History  Problem Relation Age of Onset  . Hypertension Mother   . Diabetes Mother   . Heart  disease Father        CABG at 45  . Cancer Father        lung  . Diabetes Sister   . Diabetes Brother     Allergies  Allergen Reactions  . Atorvastatin Other (See Comments)    Memory problems    Medication list reviewed and updated in full in [place].  GEN: No fevers, chills. Nontoxic. Primarily MSK c/o today. MSK: Detailed in the HPI GI: tolerating PO intake without difficulty Neuro: No numbness, parasthesias, or tingling associated. Otherwise the pertinent positives of the ROS are noted above.   Objective:   BP 140/90   Pulse 75   Temp 98.2 F (36.8 C) (Temporal)   Ht 6\' 3"  (1.905 m)   Wt 277 lb 8 oz (125.9 kg)   SpO2 97%   BMI 34.69 kg/m    GEN: Well-developed,well-nourished,in no acute distress; alert,appropriate and cooperative throughout examination HEENT: Normocephalic and atraumatic without obvious abnormalities. Ears, externally no deformities PULM: Breathing comfortably in no respiratory distress EXT: No clubbing, cyanosis, or edema PSYCH: Normally interactive. Cooperative during the interview. Pleasant. Friendly and conversant. Not anxious or depressed appearing. Normal, full affect.  Foot: L Echymosis: no Edema: no ROM: full LE B Gait: heel toe, non-antalgic MT pain: no Callus pattern: none Lateral Mall: NT Medial Mall: NT Talus: NT Navicular: NT Cuboid: NT Calcaneous: NT Metatarsals: NT 5th MT: NT Phalanges: NT Achilles: PAINFUL TO PALPATE AT INSERTION ON LEFT, SMALL NODULE Plantar Fascia: NT Fat Pad: NT Peroneals: NT Post Tib: NT Great Toe: Nml motion Ant Drawer: neg ATFL: NT CFL: NT Deltoid: NT Sensation: intact   Radiology: No results found.  Assessment and Plan:     ICD-10-CM   1. Achilles tendinitis of left lower extremity  M76.62    He is very frustrated.  He brought up the possibility of surgery, and at this point 1 month and to symptoms this would be highly inappropriate.  Given rehabilitation based on  [facility] protocols.  Add nitroglycerin.  Patient Instructions  Achilles Rehab  Calf raises while seated First lower on 2 feet, then can transition to 1 foot  Begin with 3 sets of 10 repetitions  Increase by 5 repetitions every 3 days  Goal is 3 sets of 30 repetitions  Add backpack with 5 lbs Increase by 5 lbs per week to max of 30 lbs   WHEN this is easy do it standing Do with both knee straight and knee at 20 degrees of flexion    Follow-up: 6 wks  Meds ordered this encounter  Medications  .  nitroGLYCERIN (NITRODUR - DOSED IN MG/24 HR) 0.2 mg/hr patch    Sig: Apply 1/4 patch to affected area as directed by MD and change every 24 hours.    Dispense:  30 patch    Refill:  2   No orders of the defined types were placed in this encounter.   Signed,  Maud Deed. Daijanae Rafalski, MD   Outpatient Encounter Medications as of 03/24/2019  Medication Sig  . cetirizine (ZYRTEC) 10 MG tablet Take 10 mg by mouth daily.    . citalopram (CELEXA) 20 MG tablet Take 1 tablet (20 mg total) by mouth daily.  Marland Kitchen diltiazem (CARDIZEM CD) 120 MG 24 hr capsule Take 1 capsule (120 mg total) by mouth daily.  . hyoscyamine (LEVBID) 0.375 MG 12 hr tablet TAKE 1 TABLET TWICE A DAY AS NEEDED  . lisinopril (ZESTRIL) 20 MG tablet Take 1 tablet (20 mg total) by mouth daily.  . metFORMIN (GLUCOPHAGE) 1000 MG tablet Take 1 tablet (1,000 mg total) by mouth 2 (two) times daily with a meal.  . Misc Natural Products (BLACK CHERRY CONCENTRATE PO) Take by mouth.  . Multiple Vitamin (MULTIVITAMIN WITH MINERALS) TABS tablet Take 1 tablet by mouth daily.  Marland Kitchen omeprazole (PRILOSEC) 20 MG capsule Take 1 capsule (20 mg total) by mouth daily.  . rivaroxaban (XARELTO) 20 MG TABS tablet TAKE 1 TABLET DAILY WITH SUPPER  . rosuvastatin (CRESTOR) 5 MG tablet Take 1 tablet (5 mg total) by mouth daily.  . nitroGLYCERIN (NITRODUR - DOSED IN MG/24 HR) 0.2 mg/hr patch Apply 1/4 patch to affected area as directed by MD and change every  24 hours.   No facility-administered encounter medications on file as of 03/24/2019.

## 2019-04-20 ENCOUNTER — Other Ambulatory Visit: Payer: Self-pay

## 2019-04-20 ENCOUNTER — Encounter: Payer: Self-pay | Admitting: Adult Health

## 2019-04-20 ENCOUNTER — Ambulatory Visit: Payer: BC Managed Care – PPO | Admitting: Adult Health

## 2019-04-20 DIAGNOSIS — E668 Other obesity: Secondary | ICD-10-CM | POA: Diagnosis not present

## 2019-04-20 DIAGNOSIS — G4733 Obstructive sleep apnea (adult) (pediatric): Secondary | ICD-10-CM | POA: Diagnosis not present

## 2019-04-20 NOTE — Assessment & Plan Note (Signed)
Excellent control and compliance   Plan  Patient Instructions  Continue on CPAP At bedtime Keep up good work .  Work on healthy weight .  Do not drive if sleepy.  Follow up with Dr. Elsworth Soho  In 1 year and As needed   Please contact office for sooner follow up if symptoms do not improve or worsen or seek emergency care

## 2019-04-20 NOTE — Addendum Note (Signed)
Addended by: Parke Poisson E on: 04/20/2019 03:30 PM   Modules accepted: Orders

## 2019-04-20 NOTE — Patient Instructions (Signed)
Continue on CPAP At bedtime Keep up good work .  Work on healthy weight .  Do not drive if sleepy.  Follow up with Dr. Elsworth Soho  In 1 year and As needed   Please contact office for sooner follow up if symptoms do not improve or worsen or seek emergency care

## 2019-04-20 NOTE — Assessment & Plan Note (Signed)
Healthy weight loss,

## 2019-04-20 NOTE — Progress Notes (Signed)
@Patient  ID: Charles Phelps, male    DOB: 19-Apr-1966, 53 y.o.   MRN: [id number]  Chief Complaint  Patient presents with  . Follow-up    OSa     Referring provider: Venia Carbon, MD  HPI: 53 year old male followed for severe obstructive sleep apnea on nocturnal CPAP  TEST/EVENTS :  NPSG 2005: AHI 86/hr.  04/20/2019 Follow up : OSA  Patient returns for a one-year follow-up for obstructive sleep apnea.  Patient is on CPAP at bedtime.  Patient says he is doing very well on CPAP.  He never misses a night.  Wears it each night and feels rested with no significant daytime sleepiness.  CPAP download over the last 30 days shows excellent compliance with 100% usage.  Daily average usage at 8 hours.  Patient is on auto CPAP 5 to 13 cm H2O.  AHI 1. We discussed healthy weight loss. Thinking of getting a mini CPAP for travel.    Allergies  Allergen Reactions  . Atorvastatin Other (See Comments)    Memory problems    Immunization History  Administered Date(s) Administered  . Pneumococcal Conjugate-13 01/30/2017  . Td 07/30/1998, 10/27/2009    Past Medical History:  Diagnosis Date  . Allergic rhinitis   . Anxiety   . Elevated LFTs 2000   fatty liver  . GERD (gastroesophageal reflux disease)   . Glucose intolerance (impaired glucose tolerance)   . Gout   . Hyperlipidemia   . Hypertension   . IBS (irritable bowel syndrome)   . Obstructive sleep apnea   . Osteoarthritis     Tobacco History: Social History   Tobacco Use  Smoking Status Never Smoker  Smokeless Tobacco Never Used   Counseling given: Not Answered   Outpatient Medications Prior to Visit  Medication Sig Dispense Refill  . cetirizine (ZYRTEC) 10 MG tablet Take 10 mg by mouth daily.      . citalopram (CELEXA) 20 MG tablet Take 1 tablet (20 mg total) by mouth daily. 90 tablet 3  . diltiazem (CARDIZEM CD) 120 MG 24 hr capsule Take 1 capsule (120 mg total) by mouth daily. 90 capsule 3  . hyoscyamine  (LEVBID) 0.375 MG 12 hr tablet TAKE 1 TABLET TWICE A DAY AS NEEDED 180 tablet 3  . lisinopril (ZESTRIL) 20 MG tablet Take 1 tablet (20 mg total) by mouth daily. 90 tablet 3  . metFORMIN (GLUCOPHAGE) 1000 MG tablet Take 1 tablet (1,000 mg total) by mouth 2 (two) times daily with a meal. 180 tablet 3  . Misc Natural Products (BLACK CHERRY CONCENTRATE PO) Take by mouth.    . Multiple Vitamin (MULTIVITAMIN WITH MINERALS) TABS tablet Take 1 tablet by mouth daily.    . nitroGLYCERIN (NITRODUR - DOSED IN MG/24 HR) 0.2 mg/hr patch Apply 1/4 patch to affected area as directed by MD and change every 24 hours. 30 patch 2  . omeprazole (PRILOSEC) 20 MG capsule Take 1 capsule (20 mg total) by mouth daily. 90 capsule 3  . rivaroxaban (XARELTO) 20 MG TABS tablet TAKE 1 TABLET DAILY WITH SUPPER 90 tablet 3  . rosuvastatin (CRESTOR) 5 MG tablet Take 1 tablet (5 mg total) by mouth daily. 90 tablet 3   No facility-administered medications prior to visit.      Review of Systems:   Constitutional:   No  weight loss, night sweats,  Fevers, chills, fatigue, or  lassitude.  HEENT:   No headaches,  Difficulty swallowing,  Tooth/dental problems, or  Sore throat,  No sneezing, itching, ear ache, nasal congestion, post nasal drip,   CV:  No chest pain,  Orthopnea, PND, swelling in lower extremities, anasarca, dizziness, palpitations, syncope.   GI  No heartburn, indigestion, abdominal pain, nausea, vomiting, diarrhea, change in bowel habits, loss of appetite, bloody stools.   Resp: No shortness of breath with exertion or at rest.  No excess mucus, no productive cough,  No non-productive cough,  No coughing up of blood.  No change in color of mucus.  No wheezing.  No chest wall deformity  Skin: no rash or lesions.  GU: no dysuria, change in color of urine, no urgency or frequency.  No flank pain, no hematuria   MS:  No joint pain or swelling.  No decreased range of motion.  No back pain.     Physical Exam  BP 134/74 (BP Location: Left Arm, Cuff Size: Normal)   Pulse 68   Temp (!) 96.9 F (36.1 C) (Temporal)   Ht 6\' 3"  (1.905 m)   Wt 279 lb 12.8 oz (126.9 kg)   SpO2 98%   BMI 34.97 kg/m   GEN: A/Ox3; pleasant , NAD, well nourished    HEENT:  [place]/AT,    NOSE-clear, THROAT-clear, no lesions, no postnasal drip or exudate noted.   NECK:  Supple w/ fair ROM; no JVD; normal carotid impulses w/o bruits; no thyromegaly or nodules palpated; no lymphadenopathy.    RESP  Clear  P & A; w/o, wheezes/ rales/ or rhonchi. no accessory muscle use, no dullness to percussion  CARD:  RRR, no m/r/g, no peripheral edema, pulses intact, no cyanosis or clubbing.  GI:   Soft & nt; nml bowel sounds; no organomegaly or masses detected.   Musco: Warm bil, no deformities or joint swelling noted.   Neuro: alert, no focal deficits noted.    Skin: Warm, no lesions or rashes    Lab Results:  CBC  BNP No results found for: BNP  ProBNP No results found for: PROBNP  Imaging: No results found.    No flowsheet data found.  No results found for: NITRICOXIDE      Assessment & Plan:   OSA (obstructive sleep apnea) Excellent control and compliance   Plan  Patient Instructions  Continue on CPAP At bedtime Keep up good work .  Work on healthy weight .  Do not drive if sleepy.  Follow up with Dr. Elsworth Soho  In 1 year and As needed   Please contact office for sooner follow up if symptoms do not improve or worsen or seek emergency care       Moderate obesity Healthy weight loss,       Rexene Edison, NP 04/20/2019

## 2019-04-28 ENCOUNTER — Ambulatory Visit: Payer: BC Managed Care – PPO | Admitting: Family Medicine

## 2019-06-09 DIAGNOSIS — G4733 Obstructive sleep apnea (adult) (pediatric): Secondary | ICD-10-CM | POA: Diagnosis not present

## 2019-07-14 ENCOUNTER — Encounter: Payer: BC Managed Care – PPO | Admitting: Internal Medicine

## 2019-07-15 MED ORDER — COLCHICINE 0.6 MG PO TABS: 0.6000 mg | ORAL_TABLET | Freq: Two times a day (BID) | ORAL | 1 refills | Status: DC | PRN

## 2019-09-07 DIAGNOSIS — G4733 Obstructive sleep apnea (adult) (pediatric): Secondary | ICD-10-CM | POA: Diagnosis not present

## 2019-09-14 ENCOUNTER — Ambulatory Visit (INDEPENDENT_AMBULATORY_CARE_PROVIDER_SITE_OTHER): Payer: BC Managed Care – PPO | Admitting: Internal Medicine

## 2019-09-14 ENCOUNTER — Encounter: Payer: Self-pay | Admitting: Internal Medicine

## 2019-09-14 ENCOUNTER — Other Ambulatory Visit: Payer: Self-pay

## 2019-09-14 VITALS — BP 140/86 | HR 76 | Temp 97.5°F | Ht 75.25 in | Wt 279.0 lb

## 2019-09-14 DIAGNOSIS — I480 Paroxysmal atrial fibrillation: Secondary | ICD-10-CM | POA: Diagnosis not present

## 2019-09-14 DIAGNOSIS — E119 Type 2 diabetes mellitus without complications: Secondary | ICD-10-CM

## 2019-09-14 DIAGNOSIS — I10 Essential (primary) hypertension: Secondary | ICD-10-CM

## 2019-09-14 DIAGNOSIS — Z23 Encounter for immunization: Secondary | ICD-10-CM

## 2019-09-14 DIAGNOSIS — G4733 Obstructive sleep apnea (adult) (pediatric): Secondary | ICD-10-CM

## 2019-09-14 DIAGNOSIS — Z1211 Encounter for screening for malignant neoplasm of colon: Secondary | ICD-10-CM

## 2019-09-14 DIAGNOSIS — Z0000 Encounter for general adult medical examination without abnormal findings: Secondary | ICD-10-CM | POA: Diagnosis not present

## 2019-09-14 DIAGNOSIS — Z125 Encounter for screening for malignant neoplasm of prostate: Secondary | ICD-10-CM | POA: Diagnosis not present

## 2019-09-14 LAB — CBC
HCT: 42.6 % (ref 39.0–52.0)
Hemoglobin: 14.7 g/dL (ref 13.0–17.0)
MCHC: 34.4 g/dL (ref 30.0–36.0)
MCV: 83.7 fl (ref 78.0–100.0)
Platelets: 180 10*3/uL (ref 150.0–400.0)
RBC: 5.09 Mil/uL (ref 4.22–5.81)
RDW: 13.8 % (ref 11.5–15.5)
WBC: 7.1 10*3/uL (ref 4.0–10.5)

## 2019-09-14 LAB — COMPREHENSIVE METABOLIC PANEL
ALT: 68 U/L — ABNORMAL HIGH (ref 0–53)
AST: 41 U/L — ABNORMAL HIGH (ref 0–37)
Albumin: 4.5 g/dL (ref 3.5–5.2)
Alkaline Phosphatase: 78 U/L (ref 39–117)
BUN: 15 mg/dL (ref 6–23)
CO2: 30 mEq/L (ref 19–32)
Calcium: 9.5 mg/dL (ref 8.4–10.5)
Chloride: 98 mEq/L (ref 96–112)
Creatinine, Ser: 1.24 mg/dL (ref 0.40–1.50)
GFR: 60.45 mL/min (ref >60.00)
Glucose, Bld: 209 mg/dL — ABNORMAL HIGH (ref 70–99)
Potassium: 3.7 mEq/L (ref 3.5–5.1)
Sodium: 137 mEq/L (ref 135–145)
Total Bilirubin: 1.1 mg/dL (ref 0.2–1.2)
Total Protein: 7 g/dL (ref 6.0–8.3)

## 2019-09-14 LAB — LIPID PANEL
Cholesterol: 220 mg/dL — ABNORMAL HIGH (ref 0–200)
HDL: 39.1 mg/dL (ref >39.00)
Total CHOL/HDL Ratio: 6
Triglycerides: 416 mg/dL — ABNORMAL HIGH (ref 0.0–149.0)

## 2019-09-14 LAB — T4, FREE: Free T4: 0.94 ng/dL (ref 0.60–1.60)

## 2019-09-14 LAB — HM DIABETES FOOT EXAM

## 2019-09-14 LAB — LDL CHOLESTEROL, DIRECT: Direct LDL: 136 mg/dL

## 2019-09-14 LAB — PSA: PSA: 0.99 ng/mL (ref 0.10–4.00)

## 2019-09-14 LAB — HEMOGLOBIN A1C: Hgb A1c MFr Bld: 8.2 % — ABNORMAL HIGH (ref 4.6–6.5)

## 2019-09-14 MED ORDER — HYOSCYAMINE SULFATE ER 0.375 MG PO TB12: 0.3750 mg | ORAL_TABLET | Freq: Two times a day (BID) | ORAL | 3 refills | Status: DC | PRN

## 2019-09-14 NOTE — Addendum Note (Signed)
Addended by: Pilar Grammes on: 09/14/2019 08:49 AM   Modules accepted: Orders
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
## 2019-09-14 NOTE — Assessment & Plan Note (Signed)
Really needs to do a better job with lifestyle Td booster today Yearly flu vaccine He is not sure about COVID--thinks he will defer-----discussed Discussed PSA---will check Urged colon screening----will try FIT again

## 2019-09-14 NOTE — Assessment & Plan Note (Signed)
No symptoms On the xrelto

## 2019-09-14 NOTE — Assessment & Plan Note (Signed)
Not checking Has HTN as well If over 8%----will add dulaglutide or flozin (depending on whether he will take a shot)

## 2019-09-14 NOTE — Assessment & Plan Note (Signed)
BP Readings from Last 3 Encounters:  09/14/19 140/86  04/20/19 134/74  03/24/19 140/90   Hopefully will go down with better lifestyle

## 2019-09-14 NOTE — Assessment & Plan Note (Signed)
Sleeps with the CPAP every night °

## 2019-09-16 ENCOUNTER — Telehealth: Payer: Self-pay

## 2019-09-16 NOTE — Telephone Encounter (Signed)
LVM for pt to call for lab results and PCP msg.  Msg below Viviana Simpler I, MD  09/15/2019 3:00 PM EST    Results released Please call to find out if he is willing to start the new medication (dulaglutide 0.75mg  weekly if he will take a shot, or farxiga 5mg  daily if he won't). If her prefers no new medication, set up A1c in 3 months       Comments to Patient Not seen   Well, your diabetes control has really worsened. I would recommend another medication (and if you do not want to start one now, I can give you 3 months to improve your lifestyle to get your A1c under 8% again). I will have Larene Beach call about this. Cholesterol is up quite a bit--hopefully you can tolerate the medication at least once a day .Marland KitchenMarland Kitchen

## 2019-09-21 ENCOUNTER — Other Ambulatory Visit: Payer: Self-pay

## 2019-09-21 MED ORDER — DILTIAZEM HCL ER COATED BEADS 120 MG PO CP24: 120.0000 mg | ORAL_CAPSULE | Freq: Every day | ORAL | 3 refills | Status: DC

## 2019-09-21 NOTE — Telephone Encounter (Signed)
Rx sent electronically.  

## 2019-10-04 ENCOUNTER — Other Ambulatory Visit: Payer: Self-pay | Admitting: Internal Medicine

## 2019-10-06 ENCOUNTER — Other Ambulatory Visit: Payer: Self-pay

## 2019-10-06 MED ORDER — RIVAROXABAN 20 MG PO TABS: ORAL_TABLET | ORAL | 3 refills | Status: DC

## 2019-10-06 MED ORDER — COLCHICINE 0.6 MG PO TABS: 0.6000 mg | ORAL_TABLET | Freq: Two times a day (BID) | ORAL | 1 refills | Status: DC | PRN

## 2019-10-06 NOTE — Telephone Encounter (Signed)
Last time Colchicine was refilled on 07/15/2019 #60 with 1 refill to [facility]. Request is coming for [facility] this time.  LOV 09/14/19  CPE

## 2019-10-06 NOTE — Addendum Note (Signed)
Addended by: Kris Mouton on: 10/06/2019 11:58 AM   Modules accepted: Orders

## 2019-10-06 NOTE — Addendum Note (Signed)
Addended by: Viviana Simpler I on: 10/06/2019 12:41 PM   Modules accepted: Orders

## 2019-10-06 NOTE — Telephone Encounter (Signed)
Contacted pt with lab results and he requested refill of xarelto. Pt reports this was originally prescribed by the [facility]-fib clinic but they reported he does not need to see them any longer and the PCP can refill. Pt would like sent to [facility]. Advised a msg would be sent to PCP for authorization. Pt verbalized understanding.   Last OV: 09/14/19 Next OV: 04/14/20 Lab apt:  6/17/121

## 2019-12-06 DIAGNOSIS — G4733 Obstructive sleep apnea (adult) (pediatric): Secondary | ICD-10-CM | POA: Diagnosis not present

## 2019-12-14 ENCOUNTER — Encounter: Payer: Self-pay | Admitting: Internal Medicine

## 2019-12-30 ENCOUNTER — Other Ambulatory Visit: Payer: Self-pay | Admitting: Internal Medicine

## 2019-12-30 DIAGNOSIS — E1159 Type 2 diabetes mellitus with other circulatory complications: Secondary | ICD-10-CM

## 2020-01-12 ENCOUNTER — Other Ambulatory Visit: Payer: Self-pay

## 2020-01-13 ENCOUNTER — Other Ambulatory Visit (INDEPENDENT_AMBULATORY_CARE_PROVIDER_SITE_OTHER): Payer: BC Managed Care – PPO

## 2020-01-13 DIAGNOSIS — E1159 Type 2 diabetes mellitus with other circulatory complications: Secondary | ICD-10-CM | POA: Diagnosis not present

## 2020-01-13 LAB — HEMOGLOBIN A1C: Hgb A1c MFr Bld: 7.7 % — ABNORMAL HIGH (ref 4.6–6.5)

## 2020-01-19 ENCOUNTER — Telehealth: Payer: Self-pay

## 2020-01-19 NOTE — Telephone Encounter (Signed)
Per Dr Silvio Pate, Please set up in office follow up in 3-4 months.  Left message for pt to call and set up appt. He has seen his labs per MyChart.

## 2020-02-03 ENCOUNTER — Other Ambulatory Visit: Payer: Self-pay | Admitting: Internal Medicine

## 2020-02-03 DIAGNOSIS — D1801 Hemangioma of skin and subcutaneous tissue: Secondary | ICD-10-CM | POA: Diagnosis not present

## 2020-02-03 DIAGNOSIS — L570 Actinic keratosis: Secondary | ICD-10-CM | POA: Diagnosis not present

## 2020-02-03 DIAGNOSIS — L821 Other seborrheic keratosis: Secondary | ICD-10-CM | POA: Diagnosis not present

## 2020-02-03 DIAGNOSIS — D229 Melanocytic nevi, unspecified: Secondary | ICD-10-CM | POA: Diagnosis not present

## 2020-02-18 DIAGNOSIS — E113292 Type 2 diabetes mellitus with mild nonproliferative diabetic retinopathy without macular edema, left eye: Secondary | ICD-10-CM | POA: Diagnosis not present

## 2020-02-18 LAB — HM DIABETES EYE EXAM

## 2020-04-11 ENCOUNTER — Other Ambulatory Visit (INDEPENDENT_AMBULATORY_CARE_PROVIDER_SITE_OTHER): Payer: BC Managed Care – PPO

## 2020-04-11 ENCOUNTER — Telehealth: Payer: Self-pay | Admitting: Radiology

## 2020-04-11 ENCOUNTER — Other Ambulatory Visit: Payer: Self-pay | Admitting: Internal Medicine

## 2020-04-11 DIAGNOSIS — Z1211 Encounter for screening for malignant neoplasm of colon: Secondary | ICD-10-CM

## 2020-04-11 DIAGNOSIS — R195 Other fecal abnormalities: Secondary | ICD-10-CM

## 2020-04-11 LAB — FECAL OCCULT BLOOD, IMMUNOCHEMICAL: Fecal Occult Bld: POSITIVE — AB

## 2020-04-11 NOTE — Telephone Encounter (Signed)
Note sent to him on MyChart and GI referral placed

## 2020-04-11 NOTE — Telephone Encounter (Signed)
[facility] called a critical result, positive ifob, results given to Dr Silvio Pate

## 2020-04-14 ENCOUNTER — Ambulatory Visit: Payer: BC Managed Care – PPO | Admitting: Internal Medicine

## 2020-04-14 NOTE — Telephone Encounter (Signed)
Form completed as much as possible and placed in Dr Alla German Inbox on his desk to sign when he returns Tuesday.

## 2020-04-18 NOTE — Telephone Encounter (Signed)
Form done Find out if he wants it mailed or for pick up No charge

## 2020-04-18 NOTE — Telephone Encounter (Signed)
Patient notified form is ready.  Patient will pick up form in the next couple of days.

## 2020-04-20 DIAGNOSIS — U071 COVID-19: Secondary | ICD-10-CM | POA: Diagnosis not present

## 2020-04-25 ENCOUNTER — Ambulatory Visit: Payer: BC Managed Care – PPO | Admitting: Internal Medicine

## 2020-05-04 ENCOUNTER — Encounter: Payer: Self-pay | Admitting: Gastroenterology

## 2020-05-05 DIAGNOSIS — D225 Melanocytic nevi of trunk: Secondary | ICD-10-CM | POA: Diagnosis not present

## 2020-05-05 DIAGNOSIS — L820 Inflamed seborrheic keratosis: Secondary | ICD-10-CM | POA: Diagnosis not present

## 2020-05-05 DIAGNOSIS — L814 Other melanin hyperpigmentation: Secondary | ICD-10-CM | POA: Diagnosis not present

## 2020-05-05 DIAGNOSIS — L738 Other specified follicular disorders: Secondary | ICD-10-CM | POA: Diagnosis not present

## 2020-05-05 DIAGNOSIS — D1801 Hemangioma of skin and subcutaneous tissue: Secondary | ICD-10-CM | POA: Diagnosis not present

## 2020-05-12 ENCOUNTER — Ambulatory Visit: Payer: BC Managed Care – PPO | Admitting: Adult Health

## 2020-05-12 ENCOUNTER — Encounter: Payer: Self-pay | Admitting: Adult Health

## 2020-05-12 ENCOUNTER — Other Ambulatory Visit: Payer: Self-pay

## 2020-05-12 VITALS — BP 132/80 | HR 71 | Ht 75.25 in | Wt 272.2 lb

## 2020-05-12 DIAGNOSIS — G4733 Obstructive sleep apnea (adult) (pediatric): Secondary | ICD-10-CM

## 2020-05-12 DIAGNOSIS — E668 Other obesity: Secondary | ICD-10-CM

## 2020-05-12 NOTE — Assessment & Plan Note (Signed)
Weight loss encouraged 

## 2020-05-12 NOTE — Assessment & Plan Note (Signed)
Excellent control and compliance on nocturnal CPAP. Order for new CPAP-same settings  Plan  Patient Instructions  Order for new CPAP machine .  Continue on CPAP At bedtime Keep up good work .  Work on healthy weight .  Do not drive if sleepy.  Follow up with Dr. Elsworth Soho  In 1 year and As needed   Please contact office for sooner follow up if symptoms do not improve or worsen or seek emergency care

## 2020-05-12 NOTE — Progress Notes (Signed)
@Patient  ID: Charles Phelps, male    DOB: [birth year], 54 y.o.   MRN: [id number]  Chief Complaint  Patient presents with  . Follow-up    OSA     Referring provider: Venia Carbon, MD  HPI: 54 year old male followed for severe obstructive sleep apnea on nocturnal CPAP  TEST/EVENTS :  NPSG 2005: AHI 86/hr.  05/12/2020 Follow up : OSA  Patient presents for a 1 year follow-up for obstructive sleep apnea.  Patient remains on CPAP at bedtime.  Says that he is doing well on CPAP.  He never misses a night.  He feels rested with no significant daytime sleepiness.  Feels that he benefits from CPAP.  CPAP download shows excellent compliance and control.  He has 100% daily usage.  Daily average usage around 8 hours.  Patient is on auto CPAP 5 to 13 cm H2O.  AHI 1.4. Patient says he does need a new CPAP.  His CPAP is older and has recently showed that his humidifier is not working.   Allergies  Allergen Reactions  . Atorvastatin Other (See Comments)    Memory problems    Immunization History  Administered Date(s) Administered  . Pneumococcal Conjugate-13 01/30/2017  . Td 07/30/1998, 10/27/2009, 09/14/2019    Past Medical History:  Diagnosis Date  . Allergic rhinitis   . Anxiety   . Elevated LFTs 2000   fatty liver  . GERD (gastroesophageal reflux disease)   . Glucose intolerance (impaired glucose tolerance)   . Gout   . Hyperlipidemia   . Hypertension   . IBS (irritable bowel syndrome)   . Obstructive sleep apnea   . Osteoarthritis     Tobacco History: Social History   Tobacco Use  Smoking Status Never Smoker  Smokeless Tobacco Never Used   Counseling given: Not Answered   Outpatient Medications Prior to Visit  Medication Sig Dispense Refill  . cetirizine (ZYRTEC) 10 MG tablet Take 10 mg by mouth daily.      . citalopram (CELEXA) 20 MG tablet TAKE 1 TABLET BY MOUTH  DAILY 90 tablet 3  . colchicine 0.6 MG tablet TAKE 1 TABLET BY MOUTH  TWICE DAILY AS NEEDED  180 tablet 3  . diltiazem (CARDIZEM CD) 120 MG 24 hr capsule Take 1 capsule (120 mg total) by mouth daily. 90 capsule 3  . lisinopril (ZESTRIL) 20 MG tablet TAKE 1 TABLET BY MOUTH  DAILY 90 tablet 3  . metFORMIN (GLUCOPHAGE) 1000 MG tablet TAKE 1 TABLET BY MOUTH  TWICE A DAY WITH MEALS 180 tablet 3  . Misc Natural Products (BLACK CHERRY CONCENTRATE PO) Take by mouth.    . Multiple Vitamin (MULTIVITAMIN WITH MINERALS) TABS tablet Take 1 tablet by mouth daily.    . nitroGLYCERIN (NITRODUR - DOSED IN MG/24 HR) 0.2 mg/hr patch Apply 1/4 patch to affected area as directed by MD and change every 24 hours. 30 patch 2  . omeprazole (PRILOSEC) 20 MG capsule TAKE 1 CAPSULE BY MOUTH  DAILY 90 capsule 3  . rivaroxaban (XARELTO) 20 MG TABS tablet TAKE 1 TABLET DAILY WITH SUPPER 90 tablet 3  . rosuvastatin (CRESTOR) 5 MG tablet TAKE 1 TABLET BY MOUTH  DAILY 90 tablet 3  . hyoscyamine (LEVBID) 0.375 MG 12 hr tablet Take 1 tablet (0.375 mg total) by mouth 2 (two) times daily as needed. 180 tablet 3   No facility-administered medications prior to visit.     Review of Systems:   Constitutional:   No  weight loss,  night sweats,  Fevers, chills, fatigue, or  lassitude.  HEENT:   No headaches,  Difficulty swallowing,  Tooth/dental problems, or  Sore throat,                No sneezing, itching, ear ache, nasal congestion, post nasal drip,   CV:  No chest pain,  Orthopnea, PND, swelling in lower extremities, anasarca, dizziness, palpitations, syncope.   GI  No heartburn, indigestion, abdominal pain, nausea, vomiting, diarrhea, change in bowel habits, loss of appetite, bloody stools.   Resp: No shortness of breath with exertion or at rest.  No excess mucus, no productive cough,  No non-productive cough,  No coughing up of blood.  No change in color of mucus.  No wheezing.  No chest wall deformity  Skin: no rash or lesions.  GU: no dysuria, change in color of urine, no urgency or frequency.  No flank pain, no  hematuria   MS:  No joint pain or swelling.  No decreased range of motion.  No back pain.    Physical Exam  BP 132/80 (BP Location: Left Arm, Cuff Size: Normal)   Pulse 71   Ht 6' 3.25" (1.911 m)   Wt 272 lb 3.2 oz (123.5 kg)   SpO2 98%   BMI 33.80 kg/m   GEN: A/Ox3; pleasant , NAD, well nourished    HEENT:  [place]/AT,    NOSE-clear, THROAT-clear, no lesions, no postnasal drip or exudate noted.   NECK:  Supple w/ fair ROM; no JVD; normal carotid impulses w/o bruits; no thyromegaly or nodules palpated; no lymphadenopathy.    RESP  Clear  P & A; w/o, wheezes/ rales/ or rhonchi. no accessory muscle use, no dullness to percussion  CARD:  RRR, no m/r/g, no peripheral edema, pulses intact, no cyanosis or clubbing.  GI:   Soft & nt; nml bowel sounds; no organomegaly or masses detected.   Musco: Warm bil, no deformities or joint swelling noted.   Neuro: alert, no focal deficits noted.    Skin: Warm, no lesions or rashes    Lab Results:   BNP No results found for: BNP  ProBNP No results found for: PROBNP  Imaging: No results found.    No flowsheet data found.  No results found for: NITRICOXIDE      Assessment & Plan:   OSA (obstructive sleep apnea) Excellent control and compliance on nocturnal CPAP. Order for new CPAP-same settings  Plan  Patient Instructions  Order for new CPAP machine .  Continue on CPAP At bedtime Keep up good work .  Work on healthy weight .  Do not drive if sleepy.  Follow up with Dr. Elsworth Soho  In 1 year and As needed   Please contact office for sooner follow up if symptoms do not improve or worsen or seek emergency care        Moderate obesity Weight loss encouraged     Rexene Edison, NP 05/12/2020

## 2020-05-12 NOTE — Patient Instructions (Addendum)
Order for new CPAP machine .  Continue on CPAP At bedtime Keep up good work .  Work on healthy weight .  Do not drive if sleepy.  Follow up with Dr. Elsworth Soho  In 1 year and As needed   Please contact office for sooner follow up if symptoms do not improve or worsen or seek emergency care

## 2020-05-26 ENCOUNTER — Ambulatory Visit (INDEPENDENT_AMBULATORY_CARE_PROVIDER_SITE_OTHER): Payer: BC Managed Care – PPO | Admitting: Internal Medicine

## 2020-05-26 ENCOUNTER — Encounter: Payer: Self-pay | Admitting: Internal Medicine

## 2020-05-26 ENCOUNTER — Other Ambulatory Visit: Payer: Self-pay

## 2020-05-26 VITALS — BP 138/90 | HR 72 | Temp 97.6°F | Ht 75.0 in | Wt 272.0 lb

## 2020-05-26 DIAGNOSIS — E785 Hyperlipidemia, unspecified: Secondary | ICD-10-CM | POA: Diagnosis not present

## 2020-05-26 DIAGNOSIS — I10 Essential (primary) hypertension: Secondary | ICD-10-CM

## 2020-05-26 DIAGNOSIS — I480 Paroxysmal atrial fibrillation: Secondary | ICD-10-CM

## 2020-05-26 DIAGNOSIS — E1159 Type 2 diabetes mellitus with other circulatory complications: Secondary | ICD-10-CM

## 2020-05-26 LAB — POCT GLYCOSYLATED HEMOGLOBIN (HGB A1C): Hemoglobin A1C: 7.1 % — AB (ref 4.0–5.6)

## 2020-05-26 NOTE — Assessment & Plan Note (Signed)
Regular still On the diltiazem and xarelto

## 2020-05-26 NOTE — Assessment & Plan Note (Addendum)
Hopefully still good control Next step is adding more medication --preferable SGLT2 inhibitor or GLP1 agonist On metformin now  Lab Results  Component Value Date   HGBA1C 7.1 (A) 05/26/2020   Actually has improved---good work! Will hold off on other meds

## 2020-05-26 NOTE — Assessment & Plan Note (Signed)
Clear cognitive problems with crestor Didn't tolerate lipitor either Will try off for now

## 2020-05-26 NOTE — Assessment & Plan Note (Signed)
BP Readings from Last 3 Encounters:  05/26/20 138/90  05/12/20 132/80  09/14/19 140/86   Acceptable on lisinopril and diltiazem

## 2020-05-26 NOTE — Progress Notes (Signed)
Subjective:    Patient ID: Charles Phelps, male    DOB: 12-19-1965, 54 y.o.   MRN: [id number]  HPI Here for follow up of diabetes and other medical conditions This visit occurred during the SARS-CoV-2 public health emergency.  Safety protocols were in place, including screening questions prior to the visit, additional usage of staff PPE, and extensive cleaning of exam room while observing appropriate contact time as indicated for disinfecting solutions.   Had a bad 3 weeks when he was going through [place] Did drink a lot of gatorade About 2 months ago  Not checking sugars No hypoglycemic spells No foot numbness or pain  No chest pain  No SOB No palpitations No edema  Still feels he has memory problems with the rosuvastatin He has gone down to weekly---and didn't clearly help  Continues on citalopram Mood level on this--anxiety controlled Got laid off---got another job, then stopped Not working currently  Current Outpatient Medications on File Prior to Visit  Medication Sig Dispense Refill  . cetirizine (ZYRTEC) 10 MG tablet Take 10 mg by mouth daily.      . citalopram (CELEXA) 20 MG tablet TAKE 1 TABLET BY MOUTH  DAILY 90 tablet 3  . colchicine 0.6 MG tablet TAKE 1 TABLET BY MOUTH  TWICE DAILY AS NEEDED 180 tablet 3  . diltiazem (CARDIZEM CD) 120 MG 24 hr capsule Take 1 capsule (120 mg total) by mouth daily. 90 capsule 3  . lisinopril (ZESTRIL) 20 MG tablet TAKE 1 TABLET BY MOUTH  DAILY 90 tablet 3  . metFORMIN (GLUCOPHAGE) 1000 MG tablet TAKE 1 TABLET BY MOUTH  TWICE A DAY WITH MEALS 180 tablet 3  . Misc Natural Products (BLACK CHERRY CONCENTRATE PO) Take by mouth.    . Multiple Vitamin (MULTIVITAMIN WITH MINERALS) TABS tablet Take 1 tablet by mouth daily.    . nitroGLYCERIN (NITRODUR - DOSED IN MG/24 HR) 0.2 mg/hr patch Apply 1/4 patch to affected area as directed by MD and change every 24 hours. 30 patch 2  . omeprazole (PRILOSEC) 20 MG capsule TAKE 1 CAPSULE BY MOUTH   DAILY 90 capsule 3  . rivaroxaban (XARELTO) 20 MG TABS tablet TAKE 1 TABLET DAILY WITH SUPPER 90 tablet 3  . rosuvastatin (CRESTOR) 5 MG tablet TAKE 1 TABLET BY MOUTH  DAILY (Patient taking differently: 5 mg. Once a week) 90 tablet 3   No current facility-administered medications on file prior to visit.    Allergies  Allergen Reactions  . Atorvastatin Other (See Comments)    Memory problems    Past Medical History:  Diagnosis Date  . Allergic rhinitis   . Anxiety   . Elevated LFTs 2000   fatty liver  . GERD (gastroesophageal reflux disease)   . Glucose intolerance (impaired glucose tolerance)   . Gout   . Hyperlipidemia   . Hypertension   . IBS (irritable bowel syndrome)   . Obstructive sleep apnea   . Osteoarthritis     History reviewed. No pertinent surgical history.  Family History  Problem Relation Age of Onset  . Hypertension Mother   . Diabetes Mother   . Heart disease Father        CABG at 56  . Cancer Father        lung  . Diabetes Sister   . Diabetes Brother     Social History   Socioeconomic History  . Marital status: Married    Spouse name: Not on file  . Number of  children: 1  . Years of education: Not on file  . Highest education level: Not on file  Occupational History  . Occupation: Investment banker, corporate / Geologist, engineering   Tobacco Use  . Smoking status: Never Smoker  . Smokeless tobacco: Never Used  Substance and Sexual Activity  . Alcohol use: Yes    Comment: Occasionally  . Drug use: No  . Sexual activity: Not on file  Other Topics Concern  . Not on file  Social History Narrative  . Not on file   Social Determinants of Health   Financial Resource Strain:   . Difficulty of Paying Living Expenses: Not on file  Food Insecurity:   . Worried About Charity fundraiser in the Last Year: Not on file  . Ran Out of Food in the Last Year: Not on file  Transportation Needs:   . Lack of Transportation (Medical): Not on file  . Lack of  Transportation (Non-Medical): Not on file  Physical Activity:   . Days of Exercise per Week: Not on file  . Minutes of Exercise per Session: Not on file  Stress:   . Feeling of Stress : Not on file  Social Connections:   . Frequency of Communication with Friends and Family: Not on file  . Frequency of Social Gatherings with Friends and Family: Not on file  . Attends Religious Services: Not on file  . Active Member of Clubs or Organizations: Not on file  . Attends Archivist Meetings: Not on file  . Marital Status: Not on file  Intimate Partner Violence:   . Fear of Current or Ex-Partner: Not on file  . Emotionally Abused: Not on file  . Physically Abused: Not on file  . Sexually Abused: Not on file   Review of Systems Achilles tendonitis is better with new shoes Appetite is fine Weight down slightly Sleeps well    Objective:   Physical Exam Constitutional:      Appearance: Normal appearance.  Cardiovascular:     Rate and Rhythm: Normal rate and regular rhythm.     Pulses: Normal pulses.     Heart sounds: No murmur heard.  No gallop.   Pulmonary:     Effort: Pulmonary effort is normal.     Breath sounds: Normal breath sounds. No wheezing or rales.  Musculoskeletal:     Cervical back: Neck supple.     Right lower leg: No edema.     Left lower leg: No edema.  Lymphadenopathy:     Cervical: No cervical adenopathy.  Skin:    Comments: No foot lesions  Neurological:     Mental Status: He is alert.  Psychiatric:        Mood and Affect: Mood normal.        Behavior: Behavior normal.            Assessment & Plan:

## 2020-05-30 ENCOUNTER — Telehealth: Payer: Self-pay

## 2020-05-30 ENCOUNTER — Ambulatory Visit: Payer: BC Managed Care – PPO | Admitting: Gastroenterology

## 2020-05-30 ENCOUNTER — Other Ambulatory Visit: Payer: BC Managed Care – PPO

## 2020-05-30 ENCOUNTER — Encounter: Payer: Self-pay | Admitting: Gastroenterology

## 2020-05-30 VITALS — BP 140/80 | HR 88 | Ht 75.0 in | Wt 272.0 lb

## 2020-05-30 DIAGNOSIS — K529 Noninfective gastroenteritis and colitis, unspecified: Secondary | ICD-10-CM

## 2020-05-30 DIAGNOSIS — K219 Gastro-esophageal reflux disease without esophagitis: Secondary | ICD-10-CM

## 2020-05-30 DIAGNOSIS — R195 Other fecal abnormalities: Secondary | ICD-10-CM

## 2020-05-30 DIAGNOSIS — Z7901 Long term (current) use of anticoagulants: Secondary | ICD-10-CM | POA: Diagnosis not present

## 2020-05-30 MED ORDER — PLENVU 140 G PO SOLR
1.0000 | ORAL | 0 refills | Status: DC
Start: 2020-05-30 — End: 2020-06-14

## 2020-05-30 NOTE — Telephone Encounter (Signed)
[facility] Pre-operative Risk Assessment     Request for surgical clearance:     Endoscopy Procedure  What type of surgery is being performed?     Colonoscopy/Endoscopy  When is this surgery scheduled?     06/14/20  What type of clearance is required ?   Pharmacy  Are there any medications that need to be held prior to surgery and how long? Xarelto starting 24 hours prior  Practice name and name of physician performing surgery?      [facility]  What is your office phone and fax number?      Phone- [phone]  [phone]  Anesthesia type (None, local, MAC, general) ?       MAC

## 2020-05-30 NOTE — Patient Instructions (Signed)
If you are age 56 or older, your body mass index should be between 23-30. Your Body mass index is 34 kg/m. If this is out of the aforementioned range listed, please consider follow up with your Primary Care Provider.  If you are age 36 or younger, your body mass index should be between 19-25. Your Body mass index is 34 kg/m. If this is out of the aformentioned range listed, please consider follow up with your Primary Care Provider.   You have been scheduled for an endoscopy and colonoscopy. Please follow the written instructions given to you at your visit today. Please pick up your prep supplies at the pharmacy within the next 1-3 days. If you use inhalers (even only as needed), please bring them with you on the day of your procedure.  Your provider has requested that you go to the basement level for lab work before leaving today. Press "B" on the elevator. The lab is located at the first door on the left as you exit the elevator.  Due to recent changes in healthcare laws, you may see the results of your imaging and laboratory studies on MyChart before your provider has had a chance to review them.  We understand that in some cases there may be results that are confusing or concerning to you. Not all laboratory results come back in the same time frame and the provider may be waiting for multiple results in order to interpret others.  Please give [place] 48 hours in order for your provider to thoroughly review all the results before contacting the office for clarification of your results.   You will be contacted by our office prior to your procedure for directions on holding your Xarelto.  If you do not hear from our office 1 week prior to your scheduled procedure, please call [phone] to discuss.   A high fiber diet with plenty of fluids (up to 8 glasses of water daily) is suggested to relieve these symptoms.  Metamucil, 1 tablespoon once or twice daily can be used to keep bowels regular if  needed.  Follow up pending your labs and procedures.

## 2020-05-30 NOTE — Progress Notes (Signed)
05/30/2020 Charles Phelps [id number] 07-Apr-1966   HISTORY OF PRESENT ILLNESS: This is a 54 year old male who is new to our office.  He was referred here by his PCP, Dr. Silvio Pate, in order to discuss colonoscopy for positive FIT test.  He has never had colonoscopy in the past.  He denies seeing blood in his stools or black stools.  He is on Xarelto for history of atrial fibrillation.  That is prescribed by his PCP.  While he is here he admits that he has had chronic issues with loose stools/diarrhea and very frequent bowel movements.  He says that this has been an issue dating back to his teenage years from what he can recall.  He says that he has frequent diarrhea, but not always.  Is more the frequency of his bowel habits that is more bothersome and interferes with his daily life.  He says that sometimes he will visit the bathroom 8 times a day, but stools are not necessarily diarrhea, sometimes they are formed but he just does not feel like he empties well, etc.  Sometimes he has diarrhea within a very short period of time after eating, however.  He also admits to longstanding acid reflux, but overall that has been controlled on omeprazole 20 mg daily.   Past Medical History:  Diagnosis Date  . Allergic rhinitis   . Anxiety   . Elevated LFTs 2000   fatty liver  . GERD (gastroesophageal reflux disease)   . Glucose intolerance (impaired glucose tolerance)   . Gout   . Hyperlipidemia   . Hypertension   . IBS (irritable bowel syndrome)   . Obstructive sleep apnea   . Osteoarthritis    No past surgical history on file.  reports that he has never smoked. He has never used smokeless tobacco. He reports current alcohol use. He reports that he does not use drugs. family history includes Cancer in his father; Diabetes in his brother, mother, and sister; Heart disease in his father; Hypertension in his mother. Allergies  Allergen Reactions  . Atorvastatin Other (See Comments)    Memory  problems      Outpatient Encounter Medications as of 05/30/2020  Medication Sig  . cetirizine (ZYRTEC) 10 MG tablet Take 10 mg by mouth daily.    . citalopram (CELEXA) 20 MG tablet TAKE 1 TABLET BY MOUTH  DAILY  . colchicine 0.6 MG tablet TAKE 1 TABLET BY MOUTH  TWICE DAILY AS NEEDED  . diltiazem (CARDIZEM CD) 120 MG 24 hr capsule Take 1 capsule (120 mg total) by mouth daily.  Marland Kitchen lisinopril (ZESTRIL) 20 MG tablet TAKE 1 TABLET BY MOUTH  DAILY  . metFORMIN (GLUCOPHAGE) 1000 MG tablet TAKE 1 TABLET BY MOUTH  TWICE A DAY WITH MEALS  . Misc Natural Products (BLACK CHERRY CONCENTRATE PO) Take by mouth.  . Multiple Vitamin (MULTIVITAMIN WITH MINERALS) TABS tablet Take 1 tablet by mouth daily.  . nitroGLYCERIN (NITRODUR - DOSED IN MG/24 HR) 0.2 mg/hr patch Apply 1/4 patch to affected area as directed by MD and change every 24 hours.  Marland Kitchen omeprazole (PRILOSEC) 20 MG capsule TAKE 1 CAPSULE BY MOUTH  DAILY  . rivaroxaban (XARELTO) 20 MG TABS tablet TAKE 1 TABLET DAILY WITH SUPPER   No facility-administered encounter medications on file as of 05/30/2020.    REVIEW OF SYSTEMS  : All other systems reviewed and negative except where noted in the History of Present Illness.   PHYSICAL EXAM: BP 140/80   Pulse  88   Ht 6\' 3"  (1.905 m)   Wt 272 lb (123.4 kg)   BMI 34.00 kg/m  General: Well developed white male in no acute distress Head: Normocephalic and atraumatic Eyes:  Sclerae anicteric, conjunctiva pink. Ears: Normal auditory acuity Lungs: Clear throughout to auscultation; no W/R/R. Heart: Regular rate and rhythm; no M/R/G. Abdomen: Soft, non-distended.  BS present.  Mild lower abdominal TTP. Rectal:  Will be done at the time of colonoscopy. Musculoskeletal: Symmetrical with no gross deformities  Skin: No lesions on visible extremities Extremities: No edema  Neurological: Alert oriented x 4, grossly non-focal Psychological:  Alert and cooperative. Normal mood and affect  ASSESSMENT AND  PLAN: *Positive fecal immunochemistry testing: Patient denies any overt GI bleeding.  Hemoglobin is normal.  Never had colonoscopy in the past.  We will plan for colonoscopy with Dr. Hilarie Fredrickson. *Chronic diarrhea/loose stools: Not necessarily always diarrhea, but several bowel movements a day and a lot of times within a very short period of time after eating.  He says that he has had stomach issues and his bowel habits have been like this for years, even probably dating back to his teenage years.  Very likely IBS, but during colonoscopy will rule out other causes including IBD.  We will check celiac labs and will check pancreatic fecal elastase to rule out EPI.  I suggested a trial of powder fiber supplement such as Benefiber or Citrucel starting with 2 teaspoons mixed in 8 ounces of liquid daily and increasing if tolerated.  This may allow bulk to his stools and allow him more complete evacuation. *GERD: Longstanding, but overall controlled on omeprazole 20 mg daily.  Has never had an EGD in the past.  We will plan for EGD with Dr. Hilarie Fredrickson as well. *Chronic anticoagulation with Xarelto due to history of atrial fibrillation:  Will hold Xarelto for 24 ours prior to endoscopic procedures - will instruct when and how to resume after procedure. Benefits and risks of procedure explained including risks of bleeding, perforation, infection, missed lesions, reactions to medications and possible need for hospitalization and surgery for complications. Additional rare but real risk of stroke or other vascular clotting events off of Xarelto also explained and need to seek urgent help if any signs of these problems occur. Will communicate by phone or EMR with patient's prescribing provider to confirm that holding Xarelto is reasonable in this case.    CC:  Venia Carbon, MD

## 2020-05-31 LAB — IGA: Immunoglobulin A: 174 mg/dL (ref 47–310)

## 2020-05-31 LAB — TISSUE TRANSGLUTAMINASE, IGA: (tTG) Ab, IgA: <1 U/mL

## 2020-05-31 NOTE — Telephone Encounter (Signed)
Left message for patient to call back  

## 2020-05-31 NOTE — Telephone Encounter (Signed)
Yes, it is okay to hold xarelto for 1-2 days prior to the procedure

## 2020-06-01 NOTE — Progress Notes (Signed)
Addendum: Reviewed and agree with assessment and management plan. Rafia Shedden M, MD  

## 2020-06-02 NOTE — Telephone Encounter (Signed)
Left voicemail to return phone call.

## 2020-06-06 ENCOUNTER — Other Ambulatory Visit: Payer: BC Managed Care – PPO

## 2020-06-06 DIAGNOSIS — R195 Other fecal abnormalities: Secondary | ICD-10-CM | POA: Diagnosis not present

## 2020-06-06 DIAGNOSIS — K219 Gastro-esophageal reflux disease without esophagitis: Secondary | ICD-10-CM | POA: Diagnosis not present

## 2020-06-06 DIAGNOSIS — K529 Noninfective gastroenteritis and colitis, unspecified: Secondary | ICD-10-CM | POA: Diagnosis not present

## 2020-06-08 DIAGNOSIS — G4733 Obstructive sleep apnea (adult) (pediatric): Secondary | ICD-10-CM | POA: Diagnosis not present

## 2020-06-08 NOTE — Telephone Encounter (Signed)
Sent message through [employer]. Awaiting response from patient.

## 2020-06-08 NOTE — Telephone Encounter (Signed)
Patient aware when to stop Xarelto, see MyChart message.

## 2020-06-12 LAB — PANCREATIC ELASTASE, FECAL: Pancreatic Elastase-1, Stool: >500 mcg/g

## 2020-06-14 ENCOUNTER — Ambulatory Visit (AMBULATORY_SURGERY_CENTER): Payer: BC Managed Care – PPO | Admitting: Internal Medicine

## 2020-06-14 ENCOUNTER — Other Ambulatory Visit: Payer: Self-pay

## 2020-06-14 ENCOUNTER — Encounter: Payer: Self-pay | Admitting: Internal Medicine

## 2020-06-14 VITALS — BP 168/86 | HR 65 | Temp 97.1°F | Resp 17 | Ht 75.0 in | Wt 272.0 lb

## 2020-06-14 DIAGNOSIS — D122 Benign neoplasm of ascending colon: Secondary | ICD-10-CM | POA: Diagnosis not present

## 2020-06-14 DIAGNOSIS — D125 Benign neoplasm of sigmoid colon: Secondary | ICD-10-CM | POA: Diagnosis not present

## 2020-06-14 DIAGNOSIS — K319 Disease of stomach and duodenum, unspecified: Secondary | ICD-10-CM

## 2020-06-14 DIAGNOSIS — R195 Other fecal abnormalities: Secondary | ICD-10-CM | POA: Diagnosis not present

## 2020-06-14 DIAGNOSIS — D123 Benign neoplasm of transverse colon: Secondary | ICD-10-CM

## 2020-06-14 DIAGNOSIS — K5730 Diverticulosis of large intestine without perforation or abscess without bleeding: Secondary | ICD-10-CM

## 2020-06-14 DIAGNOSIS — D124 Benign neoplasm of descending colon: Secondary | ICD-10-CM | POA: Diagnosis not present

## 2020-06-14 DIAGNOSIS — K2970 Gastritis, unspecified, without bleeding: Secondary | ICD-10-CM | POA: Diagnosis not present

## 2020-06-14 DIAGNOSIS — K2950 Unspecified chronic gastritis without bleeding: Secondary | ICD-10-CM | POA: Diagnosis not present

## 2020-06-14 DIAGNOSIS — D128 Benign neoplasm of rectum: Secondary | ICD-10-CM | POA: Diagnosis not present

## 2020-06-14 DIAGNOSIS — K219 Gastro-esophageal reflux disease without esophagitis: Secondary | ICD-10-CM

## 2020-06-14 MED ORDER — SODIUM CHLORIDE 0.9 % IV SOLN: 500.0000 mL | Freq: Once | INTRAVENOUS | Status: DC

## 2020-06-14 NOTE — Patient Instructions (Signed)
Handouts provided on gastritis, polyps and diverticulosis.   Resume Xarelto (rivaroxaban) at prior dose tomorrow (06/15/20 after 3pm).    YOU HAD AN ENDOSCOPIC PROCEDURE TODAY AT [facility]:   Refer to the procedure report that was given to you for any specific questions about what was found during the examination.  If the procedure report does not answer your questions, please call your gastroenterologist to clarify.  If you requested that your care partner not be given the details of your procedure findings, then the procedure report has been included in a sealed envelope for you to review at your convenience later.  YOU SHOULD EXPECT: Some feelings of bloating in the abdomen. Passage of more gas than usual.  Walking can help get rid of the air that was put into your GI tract during the procedure and reduce the bloating. If you had a lower endoscopy (such as a colonoscopy or flexible sigmoidoscopy) you may notice spotting of blood in your stool or on the toilet paper. If you underwent a bowel prep for your procedure, you may not have a normal bowel movement for a few days.  Please Note:  You might notice some irritation and congestion in your nose or some drainage.  This is from the oxygen used during your procedure.  There is no need for concern and it should clear up in a day or so.  SYMPTOMS TO REPORT IMMEDIATELY:   Following lower endoscopy (colonoscopy or flexible sigmoidoscopy):  Excessive amounts of blood in the stool  Significant tenderness or worsening of abdominal pains  Swelling of the abdomen that is new, acute  Fever of 100F or higher   Following upper endoscopy (EGD)  Vomiting of blood or coffee ground material  New chest pain or pain under the shoulder blades  Painful or persistently difficult swallowing  New shortness of breath  Fever of 100F or higher  Black, tarry-looking stools  For urgent or emergent issues, a gastroenterologist can be reached at  any hour by calling [phone]. Do not use MyChart messaging for urgent concerns.    DIET:  We do recommend a small meal at first, but then you may proceed to your regular diet.  Drink plenty of fluids but you should avoid alcoholic beverages for 24 hours.  ACTIVITY:  You should plan to take it easy for the rest of today and you should NOT DRIVE or use heavy machinery until tomorrow (because of the sedation medicines used during the test).    FOLLOW UP: Our staff will call the number listed on your records 48-72 hours following your procedure to check on you and address any questions or concerns that you may have regarding the information given to you following your procedure. If we do not reach you, we will leave a message.  We will attempt to reach you two times.  During this call, we will ask if you have developed any symptoms of COVID 19. If you develop any symptoms (ie: fever, flu-like symptoms, shortness of breath, cough etc.) before then, please call [phone].  If you test positive for Covid 19 in the 2 weeks post procedure, please call and report this information to [place].    If any biopsies were taken you will be contacted by phone or by letter within the next 1-3 weeks.  Please call us at [phone] if you have not heard about the biopsies in 3 weeks.    SIGNATURES/CONFIDENTIALITY: You and/or your care partner have signed paperwork  which will be entered into your electronic medical record.  These signatures attest to the fact that that the information above on your After Visit Summary has been reviewed and is understood.  Full responsibility of the confidentiality of this discharge information lies with you and/or your care-partner.

## 2020-06-14 NOTE — Op Note (Signed)
[place] Patient Name: Charles Phelps Procedure Date: 06/14/2020 2:04 PM MRN: [id number] Endoscopist: Jerene Bears , MD Age: 54 Referring MD:  Date of Birth: 1966/07/14 Gender: Male Account #: [id number] Procedure:                Colonoscopy Indications:              Positive fecal immunochemical test, This is the                            patient's first colonoscopy Medicines:                Monitored Anesthesia Care Procedure:                Pre-Anesthesia Assessment:                           - Prior to the procedure, a History and Physical                            was performed, and patient medications and                            allergies were reviewed. The patient's tolerance of                            previous anesthesia was also reviewed. The risks                            and benefits of the procedure and the sedation                            options and risks were discussed with the patient.                            All questions were answered, and informed consent                            was obtained. Prior Anticoagulants: The patient has                            taken Xarelto (rivaroxaban), last dose was 2 days                            prior to procedure. ASA Grade Assessment: II - A                            patient with mild systemic disease. After reviewing                            the risks and benefits, the patient was deemed in                            satisfactory condition to undergo the procedure.  After obtaining informed consent, the colonoscope                            was passed under direct vision. Throughout the                            procedure, the patient's blood pressure, pulse, and                            oxygen saturations were monitored continuously. The                            Colonoscope was introduced through the anus and                            advanced to the  terminal ileum. The colonoscopy was                            performed without difficulty. The patient tolerated                            the procedure well. The quality of the bowel                            preparation was good. The terminal ileum, ileocecal                            valve, appendiceal orifice, and rectum were                            photographed. Scope In: 2:21:19 PM Scope Out: 2:47:37 PM Scope Withdrawal Time: 0 hours 20 minutes 11 seconds  Total Procedure Duration: 0 hours 26 minutes 18 seconds  Findings:                 Skin tags were found on perianal exam.                           A 5 mm polyp was found in the ascending colon. The                            polyp was sessile. The polyp was removed with a                            cold snare. Resection and retrieval were complete.                           Four sessile polyps were found in the transverse                            colon. The polyps were 5 to 8 mm in size. These                            polyps were  removed with a cold snare. Resection                            and retrieval were complete.                           Two sessile polyps were found in the descending                            colon. The polyps were 6 to 7 mm in size. These                            polyps were removed with a cold snare. Resection                            and retrieval were complete.                           Four sessile polyps were found in the sigmoid                            colon. The polyps were 4 to 8 mm in size. These                            polyps were removed with a cold snare. Resection                            and retrieval were complete.                           A 1 mm polyp was found in the rectum. The polyp was                            sessile. The polyp was removed with a cold biopsy                            forceps. Resection and retrieval were complete.                            A few small-mouthed diverticula were found in the                            sigmoid colon.                           The retroflexed view of the distal rectum and anal                            verge was normal and showed no anal or rectal                            abnormalities. Complications:            No immediate complications. Estimated Blood  Loss:     Estimated blood loss was minimal. Impression:               - One 5 mm polyp in the ascending colon, removed                            with a cold snare. Resected and retrieved.                           - Four 5 to 8 mm polyps in the transverse colon,                            removed with a cold snare. Resected and retrieved.                           - Two 6 to 7 mm polyps in the descending colon,                            removed with a cold snare. Resected and retrieved.                           - Four 4 to 8 mm polyps in the sigmoid colon,                            removed with a cold snare. Resected and retrieved.                           - One 1 mm polyp in the rectum, removed with a cold                            biopsy forceps. Resected and retrieved.                           - Diverticulosis in the sigmoid colon.                           - The distal rectum and anal verge are normal on                            retroflexion view. Recommendation:           - Patient has a contact number available for                            emergencies. The signs and symptoms of potential                            delayed complications were discussed with the                            patient. Return to normal activities tomorrow.                            Written discharge  instructions were provided to the                            patient.                           - Resume previous diet.                           - Continue present medications.                           - Resume Xarelto (rivaroxaban) at prior dose                             tomorrow. Refer to managing physician for further                            adjustment of therapy.                           - Await pathology results.                           - Repeat colonoscopy is recommended for                            surveillance of multiple polyps. The colonoscopy                            date will be determined after pathology results                            from today's exam become available for review. Jerene Bears, MD 06/14/2020 2:56:57 PM This report has been signed electronically.

## 2020-06-14 NOTE — Progress Notes (Signed)
Pt's states no medical or surgical changes since previsit or office visit. 

## 2020-06-14 NOTE — Progress Notes (Signed)
Called to room to assist during endoscopic procedure.  Patient ID and intended procedure confirmed with present staff. Received instructions for my participation in the procedure from the performing physician.  

## 2020-06-14 NOTE — Op Note (Addendum)
[place] Patient Name: Charles Phelps Procedure Date: 06/14/2020 2:04 PM MRN: [id number] Endoscopist: Jerene Bears , MD Age: 54 Referring MD:  Date of Birth: [birth year] Gender: Male Account #: [id number] Procedure:                Upper GI endoscopy Indications:              Gastro-esophageal reflux disease Medicines:                Monitored Anesthesia Care Procedure:                Pre-Anesthesia Assessment:                           - Prior to the procedure, a History and Physical                            was performed, and patient medications and                            allergies were reviewed. The patient's tolerance of                            previous anesthesia was also reviewed. The risks                            and benefits of the procedure and the sedation                            options and risks were discussed with the patient.                            All questions were answered, and informed consent                            was obtained. Prior Anticoagulants: The patient has                            taken Xarelto (rivaroxaban), last dose was 2 days                            prior to procedure. ASA Grade Assessment: II - A                            patient with mild systemic disease. After reviewing                            the risks and benefits, the patient was deemed in                            satisfactory condition to undergo the procedure.                           After obtaining informed consent, the endoscope was  passed under direct vision. Throughout the                            procedure, the patient's blood pressure, pulse, and                            oxygen saturations were monitored continuously. The                            Endoscope was introduced through the mouth, and                            advanced to the second part of duodenum. The upper                            GI  endoscopy was accomplished without difficulty.                            The patient tolerated the procedure well. Scope In: Scope Out: Findings:                 The examined esophagus was normal.                           Mild inflammation characterized by adherent blood,                            congestion (edema) and erythema was found at the                            incisura and in the gastric antrum. Biopsies were                            taken with a cold forceps for histology and                            Helicobacter pylori testing.                           The examined duodenum was normal. Complications:            No immediate complications. Estimated Blood Loss:     Estimated blood loss was minimal. Impression:               - Normal esophagus.                           - Gastritis. Biopsied.                           - Normal examined duodenum. Recommendation:           - Patient has a contact number available for                            emergencies. The signs and symptoms of potential  delayed complications were discussed with the                            patient. Return to normal activities tomorrow.                            Written discharge instructions were provided to the                            patient.                           - Resume previous diet.                           - Continue present medications.                           - Await pathology results.                           - See the other procedure note for documentation of                            additional recommendations. Jerene Bears, MD 06/14/2020 2:51:15 PM This report has been signed electronically.

## 2020-06-14 NOTE — Progress Notes (Signed)
PT taken to PACU. Monitors in place. VSS. Report given to RN. 

## 2020-06-16 ENCOUNTER — Telehealth: Payer: Self-pay

## 2020-06-16 NOTE — Telephone Encounter (Signed)
  Follow up Call-  Call back number 06/14/2020  Post procedure Call Back phone  # [phone]  Permission to leave phone message Yes  Some recent data might be hidden     Patient questions:  Do you have a fever, pain , or abdominal swelling? No. Pain Score  0 *  Have you tolerated food without any problems? Yes.    Have you been able to return to your normal activities? Yes.    Do you have any questions about your discharge instructions: Diet   No. Medications  No. Follow up visit  No.  Do you have questions or concerns about your Care? No.  Actions: * If pain score is 4 or above: No action needed, pain <4.  1. Have you developed a fever since your procedure? no  2.   Have you had an respiratory symptoms (SOB or cough) since your procedure? no  3.   Have you tested positive for COVID 19 since your procedure no  4.   Have you had any family members/close contacts diagnosed with the COVID 19 since your procedure?  no   If yes to any of these questions please route to Joylene John, RN and Joella Prince, RN

## 2020-06-20 ENCOUNTER — Encounter: Payer: Self-pay | Admitting: Internal Medicine

## 2020-07-08 DIAGNOSIS — G4733 Obstructive sleep apnea (adult) (pediatric): Secondary | ICD-10-CM | POA: Diagnosis not present

## 2020-07-24 ENCOUNTER — Other Ambulatory Visit: Payer: Self-pay | Admitting: Internal Medicine

## 2020-07-25 DIAGNOSIS — M9904 Segmental and somatic dysfunction of sacral region: Secondary | ICD-10-CM | POA: Diagnosis not present

## 2020-07-25 DIAGNOSIS — M9905 Segmental and somatic dysfunction of pelvic region: Secondary | ICD-10-CM | POA: Diagnosis not present

## 2020-07-25 DIAGNOSIS — M62830 Muscle spasm of back: Secondary | ICD-10-CM | POA: Diagnosis not present

## 2020-07-25 DIAGNOSIS — M9903 Segmental and somatic dysfunction of lumbar region: Secondary | ICD-10-CM | POA: Diagnosis not present

## 2020-07-27 DIAGNOSIS — M9903 Segmental and somatic dysfunction of lumbar region: Secondary | ICD-10-CM | POA: Diagnosis not present

## 2020-07-27 DIAGNOSIS — M9904 Segmental and somatic dysfunction of sacral region: Secondary | ICD-10-CM | POA: Diagnosis not present

## 2020-07-27 DIAGNOSIS — M9905 Segmental and somatic dysfunction of pelvic region: Secondary | ICD-10-CM | POA: Diagnosis not present

## 2020-07-27 DIAGNOSIS — M62830 Muscle spasm of back: Secondary | ICD-10-CM | POA: Diagnosis not present

## 2020-08-03 DIAGNOSIS — M62830 Muscle spasm of back: Secondary | ICD-10-CM | POA: Diagnosis not present

## 2020-08-03 DIAGNOSIS — M9903 Segmental and somatic dysfunction of lumbar region: Secondary | ICD-10-CM | POA: Diagnosis not present

## 2020-08-03 DIAGNOSIS — M9904 Segmental and somatic dysfunction of sacral region: Secondary | ICD-10-CM | POA: Diagnosis not present

## 2020-08-03 DIAGNOSIS — M9905 Segmental and somatic dysfunction of pelvic region: Secondary | ICD-10-CM | POA: Diagnosis not present

## 2020-08-08 DIAGNOSIS — G4733 Obstructive sleep apnea (adult) (pediatric): Secondary | ICD-10-CM | POA: Diagnosis not present

## 2020-09-08 DIAGNOSIS — G4733 Obstructive sleep apnea (adult) (pediatric): Secondary | ICD-10-CM | POA: Diagnosis not present

## 2020-09-25 ENCOUNTER — Other Ambulatory Visit: Payer: Self-pay | Admitting: Internal Medicine

## 2020-10-06 DIAGNOSIS — G4733 Obstructive sleep apnea (adult) (pediatric): Secondary | ICD-10-CM | POA: Diagnosis not present

## 2020-11-06 DIAGNOSIS — G4733 Obstructive sleep apnea (adult) (pediatric): Secondary | ICD-10-CM | POA: Diagnosis not present

## 2020-11-24 ENCOUNTER — Other Ambulatory Visit: Payer: Self-pay

## 2020-11-24 ENCOUNTER — Ambulatory Visit (INDEPENDENT_AMBULATORY_CARE_PROVIDER_SITE_OTHER): Payer: BC Managed Care – PPO | Admitting: Internal Medicine

## 2020-11-24 ENCOUNTER — Encounter: Payer: Self-pay | Admitting: Internal Medicine

## 2020-11-24 VITALS — BP 128/84 | HR 73 | Temp 97.6°F | Ht 75.0 in | Wt 269.0 lb

## 2020-11-24 DIAGNOSIS — I480 Paroxysmal atrial fibrillation: Secondary | ICD-10-CM

## 2020-11-24 DIAGNOSIS — I10 Essential (primary) hypertension: Secondary | ICD-10-CM | POA: Diagnosis not present

## 2020-11-24 DIAGNOSIS — E1159 Type 2 diabetes mellitus with other circulatory complications: Secondary | ICD-10-CM

## 2020-11-24 DIAGNOSIS — Z0000 Encounter for general adult medical examination without abnormal findings: Secondary | ICD-10-CM

## 2020-11-24 LAB — COMPREHENSIVE METABOLIC PANEL
ALT: 48 U/L (ref 0–53)
AST: 42 U/L — ABNORMAL HIGH (ref 0–37)
Albumin: 4.5 g/dL (ref 3.5–5.2)
Alkaline Phosphatase: 67 U/L (ref 39–117)
BUN: 19 mg/dL (ref 6–23)
CO2: 30 mEq/L (ref 19–32)
Calcium: 9.4 mg/dL (ref 8.4–10.5)
Chloride: 99 mEq/L (ref 96–112)
Creatinine, Ser: 1.46 mg/dL (ref 0.40–1.50)
GFR: 53.41 mL/min — ABNORMAL LOW (ref >60.00)
Glucose, Bld: 201 mg/dL — ABNORMAL HIGH (ref 70–99)
Potassium: 3.8 mEq/L (ref 3.5–5.1)
Sodium: 140 mEq/L (ref 135–145)
Total Bilirubin: 1.4 mg/dL — ABNORMAL HIGH (ref 0.2–1.2)
Total Protein: 7.3 g/dL (ref 6.0–8.3)

## 2020-11-24 LAB — LIPID PANEL
Cholesterol: 192 mg/dL (ref 0–200)
HDL: 38.3 mg/dL — ABNORMAL LOW (ref >39.00)
NonHDL: 154.05
Total CHOL/HDL Ratio: 5
Triglycerides: 363 mg/dL — ABNORMAL HIGH (ref 0.0–149.0)
VLDL: 72.6 mg/dL — ABNORMAL HIGH (ref 0.0–40.0)

## 2020-11-24 LAB — CBC
HCT: 40.9 % (ref 39.0–52.0)
Hemoglobin: 14.4 g/dL (ref 13.0–17.0)
MCHC: 35.2 g/dL (ref 30.0–36.0)
MCV: 83.6 fl (ref 78.0–100.0)
Platelets: 162 10*3/uL (ref 150.0–400.0)
RBC: 4.89 Mil/uL (ref 4.22–5.81)
RDW: 14.2 % (ref 11.5–15.5)
WBC: 6.8 10*3/uL (ref 4.0–10.5)

## 2020-11-24 LAB — LDL CHOLESTEROL, DIRECT: Direct LDL: 118 mg/dL

## 2020-11-24 LAB — T4, FREE: Free T4: 0.8 ng/dL (ref 0.60–1.60)

## 2020-11-24 LAB — HM DIABETES FOOT EXAM

## 2020-11-24 LAB — HEMOGLOBIN A1C: Hgb A1c MFr Bld: 7.4 % — ABNORMAL HIGH (ref 4.6–6.5)

## 2020-11-24 MED ORDER — METFORMIN HCL ER 500 MG PO TB24: 1000.0000 mg | ORAL_TABLET | Freq: Every day | ORAL | 3 refills | Status: DC

## 2020-11-24 MED ORDER — DAPAGLIFLOZIN PROPANEDIOL 5 MG PO TABS
5.0000 mg | ORAL_TABLET | Freq: Every day | ORAL | 3 refills | Status: DC
Start: 2020-11-24 — End: 2021-09-11

## 2020-11-24 NOTE — Assessment & Plan Note (Signed)
BP Readings from Last 3 Encounters:  11/24/20 128/84  06/14/20 (!) 168/86  05/30/20 140/80   On lisinopril and diltiazem

## 2020-11-24 NOTE — Assessment & Plan Note (Signed)
Still regular On diltiazem and xarelto

## 2020-11-24 NOTE — Progress Notes (Signed)
Subjective:    Patient ID: Charles Phelps, male    DOB: 1965/10/27, 55 y.o.   MRN: [id number]  HPI Here for physical This visit occurred during the SARS-CoV-2 public health emergency.  Safety protocols were in place, including screening questions prior to the visit, additional usage of staff PPE, and extensive cleaning of exam room while observing appropriate contact time as indicated for disinfecting solutions.   Not checking sugars at all No apparent problems--no low sugar reactions No tingling/burning in feet  Still with Achilles problems Limits his walking--and trouble even golfing  Still with GI problems Relates some of it to metformin Needs tums daily Still on omeprazole  Current Outpatient Medications on File Prior to Visit  Medication Sig Dispense Refill  . cetirizine (ZYRTEC) 10 MG tablet Take 10 mg by mouth daily.    . citalopram (CELEXA) 20 MG tablet TAKE 1 TABLET BY MOUTH  DAILY 90 tablet 3  . colchicine 0.6 MG tablet TAKE 1 TABLET BY MOUTH  TWICE DAILY AS NEEDED 180 tablet 3  . diltiazem (CARDIZEM CD) 120 MG 24 hr capsule TAKE 1 CAPSULE BY MOUTH  DAILY 90 capsule 3  . lisinopril (ZESTRIL) 20 MG tablet TAKE 1 TABLET BY MOUTH  DAILY 90 tablet 3  . Misc Natural Products (BLACK CHERRY CONCENTRATE PO) Take by mouth.    . Multiple Vitamin (MULTIVITAMIN WITH MINERALS) TABS tablet Take 1 tablet by mouth daily.    . nitroGLYCERIN (NITRODUR - DOSED IN MG/24 HR) 0.2 mg/hr patch Apply 1/4 patch to affected area as directed by MD and change every 24 hours. 30 patch 2  . omeprazole (PRILOSEC) 20 MG capsule TAKE 1 CAPSULE BY MOUTH  DAILY 90 capsule 3  . XARELTO 20 MG TABS tablet TAKE 1 TABLET BY MOUTH  DAILY WITH SUPPER 90 tablet 3  . [DISCONTINUED] metFORMIN (GLUCOPHAGE) 1000 MG tablet TAKE 1 TABLET BY MOUTH  TWICE DAILY WITH MEALS 180 tablet 3   No current facility-administered medications on file prior to visit.    Allergies  Allergen Reactions  . Atorvastatin Other (See  Comments)    Memory problems    Past Medical History:  Diagnosis Date  . Allergic rhinitis   . Anxiety   . Elevated LFTs 2000   fatty liver  . GERD (gastroesophageal reflux disease)   . Glucose intolerance (impaired glucose tolerance)   . Gout   . Hyperlipidemia   . Hypertension   . IBS (irritable bowel syndrome)   . Obstructive sleep apnea   . Osteoarthritis     History reviewed. No pertinent surgical history.  Family History  Problem Relation Age of Onset  . Hypertension Mother   . Diabetes Mother   . Heart disease Father        CABG at 68  . Cancer Father        lung  . Diabetes Sister   . Diabetes Brother     Social History   Socioeconomic History  . Marital status: Married    Spouse name: Not on file  . Number of children: 1  . Years of education: Not on file  . Highest education level: Not on file  Occupational History  . Occupation: Investment banker, corporate / Geologist, engineering     Comment: Retired  Tobacco Use  . Smoking status: Never Smoker  . Smokeless tobacco: Never Used  Substance and Sexual Activity  . Alcohol use: Yes    Comment: Occasionally  . Drug use: No  . Sexual activity:  Not on file  Other Topics Concern  . Not on file  Social History Narrative  . Not on file   Social Determinants of Health   Financial Resource Strain: Not on file  Food Insecurity: Not on file  Transportation Needs: Not on file  Physical Activity: Not on file  Stress: Not on file  Social Connections: Not on file  Intimate Partner Violence: Not on file   Review of Systems  Constitutional:       Weight down slightly Wears seat belt  HENT: Negative for dental problem, hearing loss and tinnitus.        Keeps up with dentist  Eyes: Negative for visual disturbance.       No diplopia or unilateral vision loss  Respiratory: Negative for cough, chest tightness and shortness of breath.   Cardiovascular: Negative for chest pain, palpitations and leg swelling.  Gastrointestinal:  Negative for abdominal pain and blood in stool.  Endocrine: Negative for polydipsia and polyuria.  Genitourinary: Negative for difficulty urinating and urgency.       No sexual problems  Musculoskeletal: Positive for arthralgias. Negative for back pain and joint swelling.       Mild pain in places--mostly Achilles pain  Skin: Negative for rash.       Sees derm  Allergic/Immunologic: Positive for environmental allergies. Negative for immunocompromised state.  Neurological: Negative for dizziness, syncope, light-headedness and headaches.  Hematological: Negative for adenopathy. Does not bruise/bleed easily.  Psychiatric/Behavioral: Negative for dysphoric mood and sleep disturbance. The patient is not nervous/anxious.        Objective:   Physical Exam Constitutional:      Appearance: Normal appearance.  HENT:     Right Ear: Tympanic membrane, ear canal and external ear normal.     Left Ear: Tympanic membrane, ear canal and external ear normal.     Mouth/Throat:     Pharynx: No oropharyngeal exudate or posterior oropharyngeal erythema.  Eyes:     Conjunctiva/sclera: Conjunctivae normal.     Pupils: Pupils are equal, round, and reactive to light.  Cardiovascular:     Rate and Rhythm: Normal rate and regular rhythm.     Pulses: Normal pulses.     Heart sounds: No murmur heard. No gallop.   Pulmonary:     Effort: Pulmonary effort is normal.     Breath sounds: Normal breath sounds. No wheezing or rales.  Abdominal:     Palpations: Abdomen is soft.     Tenderness: There is no abdominal tenderness.  Musculoskeletal:     Cervical back: Neck supple.     Right lower leg: No edema.     Left lower leg: No edema.  Lymphadenopathy:     Cervical: No cervical adenopathy.  Skin:    General: Skin is warm.     Findings: No rash.     Comments: Multiple benign nevi  Neurological:     General: No focal deficit present.     Mental Status: He is alert and oriented to person, place, and time.   Psychiatric:        Mood and Affect: Mood normal.        Behavior: Behavior normal.            Assessment & Plan:

## 2020-11-24 NOTE — Assessment & Plan Note (Signed)
Needs to try exercise Defer PSA Needs another colonoscopy later this year Prefers no COVID or flu vaccines

## 2020-11-24 NOTE — Patient Instructions (Signed)
Please try 1/2 citalopram daily (10mg )-----if you do well on that, I will change the prescription when you need a refill. Change to the long acting metformin --- 2 with breakfast (half your current dose) and start the new medication.

## 2020-11-24 NOTE — Assessment & Plan Note (Signed)
Lab Results  Component Value Date   HGBA1C 7.1 (A) 05/26/2020   Hopefully still good control GI problems with metformin---will decrease dose and change to XR Add farxiga

## 2020-12-06 DIAGNOSIS — G4733 Obstructive sleep apnea (adult) (pediatric): Secondary | ICD-10-CM | POA: Diagnosis not present

## 2021-01-08 ENCOUNTER — Telehealth: Payer: Self-pay

## 2021-01-08 MED ORDER — COLCHICINE 0.6 MG PO TABS: 0.6000 mg | ORAL_TABLET | Freq: Two times a day (BID) | ORAL | 0 refills | Status: DC | PRN

## 2021-01-08 NOTE — Telephone Encounter (Signed)
Rx sent electronically.  

## 2021-02-20 DIAGNOSIS — E113393 Type 2 diabetes mellitus with moderate nonproliferative diabetic retinopathy without macular edema, bilateral: Secondary | ICD-10-CM | POA: Diagnosis not present

## 2021-04-03 ENCOUNTER — Other Ambulatory Visit: Payer: Self-pay | Admitting: Internal Medicine

## 2021-05-17 ENCOUNTER — Ambulatory Visit: Payer: BC Managed Care – PPO | Admitting: Adult Health

## 2021-05-17 ENCOUNTER — Other Ambulatory Visit: Payer: Self-pay

## 2021-05-17 ENCOUNTER — Encounter: Payer: Self-pay | Admitting: Adult Health

## 2021-05-17 DIAGNOSIS — E668 Other obesity: Secondary | ICD-10-CM | POA: Diagnosis not present

## 2021-05-17 DIAGNOSIS — G4733 Obstructive sleep apnea (adult) (pediatric): Secondary | ICD-10-CM

## 2021-05-17 NOTE — Progress Notes (Addendum)
@Patient  ID: Charles Phelps, male    DOB: 1966-01-12, 55 y.o.   MRN: [id number]  Chief Complaint  Patient presents with   Follow-up    Referring provider: Venia Carbon, MD  HPI: 55 year old male followed for severe obstructive sleep apnea on nocturnal CPAP  TEST/EVENTS :  NPSG 2005:  AHI 86/hr.  05/17/2021 Follow up : OSA  Patient presents for a 1 year follow-up.  Patient has underlying severe obstructive sleep apnea.  He is on nocturnal CPAP.  Patient says he wears his CPAP every single night.  Never misses any nights.  Patient feels that he benefits from CPAP with decreased daytime sleepiness.   . Says he sleeps very well. Wakes up feeling good.  CPAP download shows excellent compliance with 100% usage daily average usage at 9 hours.  Patient is on auto CPAP 5 to 13 cm H2O.  AHI is 1.7/hr.  Weight is at 265, discussed healthy weight loss.  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  Wants to have a prescription to order supplies online.  Allergies  Allergen Reactions   Atorvastatin Other (See Comments)    Memory problems    Immunization History  Administered Date(s) Administered   Pneumococcal Conjugate-13 01/30/2017   Td 07/30/1998, 10/27/2009, 09/14/2019    Past Medical History:  Diagnosis Date   Allergic rhinitis    Anxiety    Elevated LFTs 2000   fatty liver   GERD (gastroesophageal reflux disease)    Glucose intolerance (impaired glucose tolerance)    Gout    Hyperlipidemia    Hypertension    IBS (irritable bowel syndrome)    Obstructive sleep apnea    Osteoarthritis     Tobacco History: Social History   Tobacco Use  Smoking Status Never  Smokeless Tobacco Never   Counseling given: Not Answered   Outpatient Medications Prior to Visit  Medication Sig Dispense Refill   cetirizine (ZYRTEC) 10 MG tablet Take 10 mg by mouth daily.     citalopram (CELEXA) 20 MG tablet TAKE 1 TABLET BY MOUTH  DAILY  90 tablet 3   colchicine 0.6 MG tablet TAKE 1 TABLET BY MOUTH  TWICE DAILY AS NEEDED 180 tablet 1   dapagliflozin propanediol (FARXIGA) 5 MG TABS tablet Take 1 tablet (5 mg total) by mouth daily before breakfast. 90 tablet 3   diltiazem (CARDIZEM CD) 120 MG 24 hr capsule TAKE 1 CAPSULE BY MOUTH  DAILY 90 capsule 3   lisinopril (ZESTRIL) 20 MG tablet TAKE 1 TABLET BY MOUTH  DAILY 90 tablet 3   metFORMIN (GLUCOPHAGE-XR) 500 MG 24 hr tablet Take 2 tablets (1,000 mg total) by mouth daily with breakfast. 180 tablet 3   Misc Natural Products (BLACK CHERRY CONCENTRATE PO) Take by mouth.     Multiple Vitamin (MULTIVITAMIN WITH MINERALS) TABS tablet Take 1 tablet by mouth daily.     nitroGLYCERIN (NITRODUR - DOSED IN MG/24 HR) 0.2 mg/hr patch Apply 1/4 patch to affected area as directed by MD and change every 24 hours. 30 patch 2   omeprazole (PRILOSEC) 20 MG capsule TAKE 1 CAPSULE BY MOUTH  DAILY 90 capsule 3   XARELTO 20 MG TABS tablet TAKE 1 TABLET BY MOUTH  DAILY WITH SUPPER 90 tablet 3   No facility-administered medications prior to visit.     Review of Systems:   Constitutional:   No  weight loss, night sweats,  Fevers, chills, fatigue, or  lassitude.  HEENT:   No headaches,  Difficulty swallowing,  Tooth/dental problems, or  Sore throat,                No sneezing, itching, ear ache, nasal congestion, post nasal drip,   CV:  No chest pain,  Orthopnea, PND, swelling in lower extremities, anasarca, dizziness, palpitations, syncope.   GI  No heartburn, indigestion, abdominal pain, nausea, vomiting, diarrhea, change in bowel habits, loss of appetite, bloody stools.   Resp: No shortness of breath with exertion or at rest.  No excess mucus, no productive cough,  No non-productive cough,  No coughing up of blood.  No change in color of mucus.  No wheezing.  No chest wall deformity  Skin: no rash or lesions.  GU: no dysuria, change in color of urine, no urgency or frequency.  No flank pain, no  hematuria   MS:  No joint pain or swelling.  No decreased range of motion.  No back pain.    Physical Exam  BP (!) 160/80 (BP Location: Left Arm, Patient Position: Sitting, Cuff Size: Large)   Pulse 71   Temp 98.3 F (36.8 C) (Oral)   Ht 6\' 3"  (1.905 m)   Wt 265 lb 6.4 oz (120.4 kg)   SpO2 99%   BMI 33.17 kg/m   GEN: A/Ox3; pleasant , NAD, well nourished    HEENT:  [place]/AT,  NOSE-clear, THROAT-clear, no lesions, no postnasal drip or exudate noted.  Class 3 MP airway   NECK:  Supple w/ fair ROM; no JVD; normal carotid impulses w/o bruits; no thyromegaly or nodules palpated; no lymphadenopathy.    RESP  Clear  P & A; w/o, wheezes/ rales/ or rhonchi. no accessory muscle use, no dullness to percussion  CARD:  RRR, no m/r/g, no peripheral edema, pulses intact, no cyanosis or clubbing.  GI:   Soft & nt; nml bowel sounds; no organomegaly or masses detected.   Musco: Warm bil, no deformities or joint swelling noted.   Neuro: alert, no focal deficits noted.    Skin: Warm, no lesions or rashes    Lab Results:  CBC    Component Value Date/Time   WBC 6.8 11/24/2020 0852   RBC 4.89 11/24/2020 0852   HGB 14.4 11/24/2020 0852   HCT 40.9 11/24/2020 0852   PLT 162.0 11/24/2020 0852   MCV 83.6 11/24/2020 0852   MCH 28.5 11/24/2016 0115   MCHC 35.2 11/24/2020 0852   RDW 14.2 11/24/2020 0852   LYMPHSABS 1.3 06/06/2016 0849   MONOABS 0.4 06/06/2016 0849   EOSABS 0.1 06/06/2016 0849   BASOSABS 0.1 06/06/2016 0849    BMET    Component Value Date/Time   NA 140 11/24/2020 0852   K 3.8 11/24/2020 0852   CL 99 11/24/2020 0852   CO2 30 11/24/2020 0852   GLUCOSE 201 (H) 11/24/2020 0852   BUN 19 11/24/2020 0852   CREATININE 1.46 11/24/2020 0852   CALCIUM 9.4 11/24/2020 0852   GFRNONAA >60 11/24/2016 0115   GFRAA >60 11/24/2016 0115    BNP No results found for: BNP  ProBNP No results found for: PROBNP  Imaging: No results found.    No flowsheet data found.  No  results found for: NITRICOXIDE      Assessment & Plan:   OSA (obstructive sleep apnea) Excellent control and compliance on CPAP  Plan  Patient Instructions  Continue on CPAP At bedtime Keep up good work .  Work on healthy weight .  Do not  drive if sleepy.  Follow up with Dr. Elsworth Soho  In 1 year and As needed   Please contact office for sooner follow up if symptoms do not improve or worsen or seek emergency care        Moderate obesity Healthy Weight loss      Rexene Edison, NP 05/17/2021

## 2021-05-17 NOTE — Assessment & Plan Note (Addendum)
Healthy Weight loss

## 2021-05-17 NOTE — Assessment & Plan Note (Signed)
Excellent control and compliance on CPAP  Plan  Patient Instructions  Continue on CPAP At bedtime Keep up good work .  Work on healthy weight .  Do not drive if sleepy.  Follow up with Dr. Elsworth Soho  In 1 year and As needed   Please contact office for sooner follow up if symptoms do not improve or worsen or seek emergency care

## 2021-05-17 NOTE — Patient Instructions (Addendum)
Continue on CPAP At bedtime Keep up good work .  Work on healthy weight .  Do not drive if sleepy.  Follow up with Dr. Elsworth Soho or Karan Inclan NP  In 1 year and As needed   Please contact office for sooner follow up if symptoms do not improve or worsen or seek emergency care

## 2021-05-29 ENCOUNTER — Ambulatory Visit: Payer: BC Managed Care – PPO | Admitting: Internal Medicine

## 2021-06-13 ENCOUNTER — Encounter: Payer: Self-pay | Admitting: Internal Medicine

## 2021-06-13 ENCOUNTER — Other Ambulatory Visit: Payer: Self-pay

## 2021-06-13 ENCOUNTER — Ambulatory Visit: Payer: BC Managed Care – PPO | Admitting: Internal Medicine

## 2021-06-13 VITALS — BP 136/92 | HR 66 | Temp 97.5°F | Ht 75.0 in | Wt 265.0 lb

## 2021-06-13 DIAGNOSIS — E1159 Type 2 diabetes mellitus with other circulatory complications: Secondary | ICD-10-CM

## 2021-06-13 LAB — POCT GLYCOSYLATED HEMOGLOBIN (HGB A1C): Hemoglobin A1C: 6.4 % — AB (ref 4.0–5.6)

## 2021-06-13 NOTE — Progress Notes (Signed)
Subjective:    Patient ID: Charles Phelps, male    DOB: 11/30/1965, 55 y.o.   MRN: [id number]  HPI Here for follow up of his diabetes This visit occurred during the SARS-CoV-2 public health emergency.  Safety protocols were in place, including screening questions prior to the visit, additional usage of staff PPE, and extensive cleaning of exam room while observing appropriate contact time as indicated for disinfecting solutions.   Seems like his GI issues are better on the new and lower metformin Also taking fiber supplement per Dr Hilarie Fredrickson  Some increased urinary frequency on farxiga  Not checking sugars No foot symptoms---Achilles tendonitis is better \ Current Outpatient Medications on File Prior to Visit  Medication Sig Dispense Refill   cetirizine (ZYRTEC) 10 MG tablet Take 10 mg by mouth daily.     citalopram (CELEXA) 20 MG tablet TAKE 1 TABLET BY MOUTH  DAILY 90 tablet 3   colchicine 0.6 MG tablet TAKE 1 TABLET BY MOUTH  TWICE DAILY AS NEEDED 180 tablet 1   dapagliflozin propanediol (FARXIGA) 5 MG TABS tablet Take 1 tablet (5 mg total) by mouth daily before breakfast. 90 tablet 3   diltiazem (CARDIZEM CD) 120 MG 24 hr capsule TAKE 1 CAPSULE BY MOUTH  DAILY 90 capsule 3   lisinopril (ZESTRIL) 20 MG tablet TAKE 1 TABLET BY MOUTH  DAILY 90 tablet 3   metFORMIN (GLUCOPHAGE-XR) 500 MG 24 hr tablet Take 2 tablets (1,000 mg total) by mouth daily with breakfast. 180 tablet 3   Multiple Vitamin (MULTIVITAMIN WITH MINERALS) TABS tablet Take 1 tablet by mouth daily.     nitroGLYCERIN (NITRODUR - DOSED IN MG/24 HR) 0.2 mg/hr patch Apply 1/4 patch to affected area as directed by MD and change every 24 hours. 30 patch 2   omeprazole (PRILOSEC) 20 MG capsule TAKE 1 CAPSULE BY MOUTH  DAILY 90 capsule 3   XARELTO 20 MG TABS tablet TAKE 1 TABLET BY MOUTH  DAILY WITH SUPPER 90 tablet 3   No current facility-administered medications on file prior to visit.    Allergies  Allergen Reactions    Atorvastatin Other (See Comments)    Memory problems    Past Medical History:  Diagnosis Date   Allergic rhinitis    Anxiety    Elevated LFTs 2000   fatty liver   GERD (gastroesophageal reflux disease)    Glucose intolerance (impaired glucose tolerance)    Gout    Hyperlipidemia    Hypertension    IBS (irritable bowel syndrome)    Obstructive sleep apnea    Osteoarthritis     History reviewed. No pertinent surgical history.  Family History  Problem Relation Age of Onset   Hypertension Mother    Diabetes Mother    Heart disease Father        CABG at 73   Cancer Father        lung   Diabetes Sister    Diabetes Brother     Social History   Socioeconomic History   Marital status: Married    Spouse name: Not on file   Number of children: 1   Years of education: Not on file   Highest education level: Not on file  Occupational History   Occupation: Investment banker, corporate / Geologist, engineering     Comment: Retired  Tobacco Use   Smoking status: Never   Smokeless tobacco: Never  Substance and Sexual Activity   Alcohol use: Yes    Comment: Occasionally  Drug use: No   Sexual activity: Not on file  Other Topics Concern   Not on file  Social History Narrative   Not on file   Social Determinants of Health   Financial Resource Strain: Not on file  Food Insecurity: Not on file  Transportation Needs: Not on file  Physical Activity: Not on file  Stress: Not on file  Social Connections: Not on file  Intimate Partner Violence: Not on file   Review of Systems Lost a few pounds Sleeping well---uses CBD gummies occasionally    Objective:   Physical Exam Constitutional:      Appearance: Normal appearance.  Cardiovascular:     Rate and Rhythm: Normal rate and regular rhythm.     Pulses: Normal pulses.     Heart sounds: No murmur heard.   No gallop.  Pulmonary:     Effort: Pulmonary effort is normal.     Breath sounds: Normal breath sounds. No wheezing or rales.   Musculoskeletal:     Cervical back: Neck supple.     Right lower leg: No edema.     Left lower leg: Edema present.  Lymphadenopathy:     Cervical: No cervical adenopathy.  Skin:    Comments: No foot lesions  Neurological:     Mental Status: He is alert.           Assessment & Plan:

## 2021-06-13 NOTE — Assessment & Plan Note (Signed)
Doing better with lower metformin No problems with farxiga Lab Results  Component Value Date   HGBA1C 6.4 (A) 06/13/2021   Much better  Continue current meds

## 2021-06-28 ENCOUNTER — Other Ambulatory Visit: Payer: Self-pay | Admitting: Internal Medicine

## 2021-07-15 ENCOUNTER — Encounter: Payer: Self-pay | Admitting: Internal Medicine

## 2021-08-07 ENCOUNTER — Other Ambulatory Visit: Payer: Self-pay

## 2021-08-07 ENCOUNTER — Ambulatory Visit: Payer: BC Managed Care – PPO | Admitting: Nurse Practitioner

## 2021-08-07 ENCOUNTER — Encounter: Payer: Self-pay | Admitting: Nurse Practitioner

## 2021-08-07 VITALS — BP 150/94 | HR 69 | Temp 97.9°F | Resp 16 | Ht 75.0 in | Wt 263.1 lb

## 2021-08-07 DIAGNOSIS — J069 Acute upper respiratory infection, unspecified: Secondary | ICD-10-CM | POA: Diagnosis not present

## 2021-08-07 DIAGNOSIS — H6592 Unspecified nonsuppurative otitis media, left ear: Secondary | ICD-10-CM | POA: Diagnosis not present

## 2021-08-07 MED ORDER — FLUTICASONE PROPIONATE 50 MCG/ACT NA SUSP: 2.0000 | Freq: Every day | NASAL | 0 refills | Status: DC

## 2021-08-07 NOTE — Assessment & Plan Note (Signed)
Patient has been battling infection on and off for quite some period of time.  Pending swab for RSV, flu A/B, COVID.  If all negative will consider treatment likely with doxycycline.  Patient acknowledged and is on board with treatment plan

## 2021-08-07 NOTE — Patient Instructions (Signed)
Nice to see you today The swab will take 36-48 hours to result I sent in some nose spray to help with some of the symptoms I will be in touch regarding the swab Follow up if symptoms fail to improve or worsen

## 2021-08-07 NOTE — Progress Notes (Signed)
Acute Office Visit  Subjective:    Patient ID: Charles Phelps, male    DOB: 1965/11/22, 56 y.o.   MRN: [id number]  Chief Complaint  Patient presents with   Cough    Was sick before christmas with fever and cold symptoms and it got better, started to flare up a week after christmas- not coughing up much, Chest congestion, woke up sweaty last night possible fever, fatigue, headache off and on, sore throat off and on. Covid test at home negative x 2 in the last 2 weeks.      Patient is in today for cough  Sick before christmas but then recovered minus some congestion  Wed-thurs after christmas, he  felt bad. Friday had a temp of 101.6. States that he started improving over the next couple days and now is getting worse again. Has been using tylenol with some relief Covid test last week was negative.  States a coworker had RSV and patient wonders if he has that. Symptoms some better today vs yesterday.    Past Medical History:  Diagnosis Date   Allergic rhinitis    Anxiety    Elevated LFTs 2000   fatty liver   GERD (gastroesophageal reflux disease)    Glucose intolerance (impaired glucose tolerance)    Gout    Hyperlipidemia    Hypertension    IBS (irritable bowel syndrome)    Obstructive sleep apnea    Osteoarthritis     No past surgical history on file.  Family History  Problem Relation Age of Onset   Hypertension Mother    Diabetes Mother    Heart disease Father        CABG at 20   Cancer Father        lung   Diabetes Sister    Diabetes Brother     Social History   Socioeconomic History   Marital status: Married    Spouse name: Not on file   Number of children: 1   Years of education: Not on file   Highest education level: Not on file  Occupational History   Occupation: Investment banker, corporate / Geologist, engineering     Comment: Retired  Tobacco Use   Smoking status: Never   Smokeless tobacco: Never  Substance and Sexual Activity   Alcohol use: Yes     Comment: Occasionally   Drug use: No   Sexual activity: Not on file  Other Topics Concern   Not on file  Social History Narrative   Not on file   Social Determinants of Health   Financial Resource Strain: Not on file  Food Insecurity: Not on file  Transportation Needs: Not on file  Physical Activity: Not on file  Stress: Not on file  Social Connections: Not on file  Intimate Partner Violence: Not on file    Outpatient Medications Prior to Visit  Medication Sig Dispense Refill   cetirizine (ZYRTEC) 10 MG tablet Take 10 mg by mouth daily.     citalopram (CELEXA) 20 MG tablet TAKE 1 TABLET BY MOUTH  DAILY 90 tablet 3   colchicine 0.6 MG tablet TAKE 1 TABLET BY MOUTH  TWICE DAILY AS NEEDED 180 tablet 1   dapagliflozin propanediol (FARXIGA) 5 MG TABS tablet Take 1 tablet (5 mg total) by mouth daily before breakfast. 90 tablet 3   diltiazem (CARDIZEM CD) 120 MG 24 hr capsule TAKE 1 CAPSULE BY MOUTH  DAILY 90 capsule 3   lisinopril (ZESTRIL) 20 MG tablet TAKE 1 TABLET  BY MOUTH  DAILY 90 tablet 3   metFORMIN (GLUCOPHAGE-XR) 500 MG 24 hr tablet Take 2 tablets (1,000 mg total) by mouth daily with breakfast. 180 tablet 3   Multiple Vitamin (MULTIVITAMIN WITH MINERALS) TABS tablet Take 1 tablet by mouth daily.     nitroGLYCERIN (NITRODUR - DOSED IN MG/24 HR) 0.2 mg/hr patch Apply 1/4 patch to affected area as directed by MD and change every 24 hours. 30 patch 2   omeprazole (PRILOSEC) 20 MG capsule TAKE 1 CAPSULE BY MOUTH  DAILY 90 capsule 3   XARELTO 20 MG TABS tablet TAKE 1 TABLET BY MOUTH  DAILY WITH SUPPER 90 tablet 3   No facility-administered medications prior to visit.    Allergies  Allergen Reactions   Atorvastatin Other (See Comments)    Memory problems    Review of Systems  Constitutional:  Positive for chills, fatigue and fever.  HENT:  Positive for postnasal drip. Negative for congestion, ear discharge, ear pain, sinus pressure, sinus pain and sore throat.   Respiratory:   Positive for cough (dry). Negative for shortness of breath.   Cardiovascular:  Negative for chest pain.  Gastrointestinal:  Negative for abdominal pain, diarrhea, nausea and vomiting.  Musculoskeletal:  Negative for arthralgias and myalgias.  Neurological:  Negative for headaches.      Objective:    Physical Exam Vitals and nursing note reviewed.  Constitutional:      Appearance: Normal appearance.  HENT:     Right Ear: Tympanic membrane, ear canal and external ear normal.     Left Ear: Ear canal and external ear normal.     Nose:     Right Sinus: No maxillary sinus tenderness or frontal sinus tenderness.     Left Sinus: No maxillary sinus tenderness or frontal sinus tenderness.  Eyes:     Extraocular Movements: Extraocular movements intact.     Pupils: Pupils are equal, round, and reactive to light.  Cardiovascular:     Rate and Rhythm: Normal rate and regular rhythm.  Pulmonary:     Effort: Pulmonary effort is normal.     Breath sounds: Normal breath sounds.  Abdominal:     General: Bowel sounds are normal.  Lymphadenopathy:     Cervical: No cervical adenopathy.  Neurological:     Mental Status: He is alert.    BP (!) 150/94    Pulse 69    Temp 97.9 F (36.6 C)    Resp 16    Ht 6\' 3"  (1.905 m)    Wt 263 lb 2 oz (119.4 kg)    SpO2 96%    BMI 32.89 kg/m  Wt Readings from Last 3 Encounters:  08/07/21 263 lb 2 oz (119.4 kg)  06/13/21 265 lb (120.2 kg)  05/17/21 265 lb 6.4 oz (120.4 kg)    Health Maintenance Due  Topic Date Due   HIV Screening  Never done   Hepatitis C Screening  Never done   Zoster Vaccines- Shingrix (1 of 2) Never done   Pneumococcal Vaccine 24-31 Years old (2 - PPSV23 if available, else PCV20) 01/30/2018   OPHTHALMOLOGY EXAM  02/17/2021   COLONOSCOPY (Pts 45-90yrs Insurance coverage will need to be confirmed)  06/14/2021    There are no preventive care reminders to display for this patient.   Lab Results  Component Value Date   TSH 1.14  06/14/2013   Lab Results  Component Value Date   WBC 6.8 11/24/2020   HGB 14.4 11/24/2020   HCT  40.9 11/24/2020   MCV 83.6 11/24/2020   PLT 162.0 11/24/2020   Lab Results  Component Value Date   NA 140 11/24/2020   K 3.8 11/24/2020   CO2 30 11/24/2020   GLUCOSE 201 (H) 11/24/2020   BUN 19 11/24/2020   CREATININE 1.46 11/24/2020   BILITOT 1.4 (H) 11/24/2020   ALKPHOS 67 11/24/2020   AST 42 (H) 11/24/2020   ALT 48 11/24/2020   PROT 7.3 11/24/2020   ALBUMIN 4.5 11/24/2020   CALCIUM 9.4 11/24/2020   ANIONGAP 13 11/24/2016   GFR 53.41 (L) 11/24/2020   Lab Results  Component Value Date   CHOL 192 11/24/2020   Lab Results  Component Value Date   HDL 38.30 (L) 11/24/2020   Lab Results  Component Value Date   LDLCALC 115 (H) 05/29/2017   Lab Results  Component Value Date   TRIG 363.0 (H) 11/24/2020   Lab Results  Component Value Date   CHOLHDL 5 11/24/2020   Lab Results  Component Value Date   HGBA1C 6.4 (A) 06/13/2021       Assessment & Plan:   Problem List Items Addressed This Visit       Respiratory   Upper respiratory tract infection - Primary    Patient has been battling infection on and off for quite some period of time.  Pending swab for RSV, flu A/B, COVID.  If all negative will consider treatment likely with doxycycline.  Patient acknowledged and is on board with treatment plan      Relevant Orders   COVID-19, Flu A+B and RSV     Nervous and Auditory   Fluid level behind tympanic membrane of left ear    Likely reactive to upper respiratory infection.  Start Flonase 2 sprays each nostril daily.  Did review precautions with Flonase inclusive of epistaxis.      Relevant Medications   fluticasone (FLONASE) 50 MCG/ACT nasal spray     Meds ordered this encounter  Medications   fluticasone (FLONASE) 50 MCG/ACT nasal spray    Sig: Place 2 sprays into both nostrils daily.    Dispense:  16 g    Refill:  0    Order Specific Question:    Supervising Provider    Answer:   Loura Pardon A [[id number]   This visit occurred during the SARS-CoV-2 public health emergency.  Safety protocols were in place, including screening questions prior to the visit, additional usage of staff PPE, and extensive cleaning of exam room while observing appropriate contact time as indicated for disinfecting solutions.    Romilda Garret, NP

## 2021-08-07 NOTE — Assessment & Plan Note (Signed)
Likely reactive to upper respiratory infection.  Start Flonase 2 sprays each nostril daily.  Did review precautions with Flonase inclusive of epistaxis.

## 2021-08-08 DIAGNOSIS — G4733 Obstructive sleep apnea (adult) (pediatric): Secondary | ICD-10-CM | POA: Diagnosis not present

## 2021-08-09 ENCOUNTER — Encounter: Payer: Self-pay | Admitting: Nurse Practitioner

## 2021-08-09 LAB — COVID-19, FLU A+B AND RSV
Influenza A, NAA: NOT DETECTED
Influenza B, NAA: NOT DETECTED
RSV, NAA: NOT DETECTED
SARS-CoV-2, NAA: NOT DETECTED

## 2021-08-10 ENCOUNTER — Other Ambulatory Visit: Payer: Self-pay | Admitting: Nurse Practitioner

## 2021-08-10 DIAGNOSIS — J069 Acute upper respiratory infection, unspecified: Secondary | ICD-10-CM

## 2021-08-10 MED ORDER — DOXYCYCLINE HYCLATE 100 MG PO TABS: 100.0000 mg | ORAL_TABLET | Freq: Two times a day (BID) | ORAL | 0 refills | Status: AC

## 2021-08-10 NOTE — Progress Notes (Signed)
doxycy

## 2021-08-21 DIAGNOSIS — E113393 Type 2 diabetes mellitus with moderate nonproliferative diabetic retinopathy without macular edema, bilateral: Secondary | ICD-10-CM | POA: Diagnosis not present

## 2021-09-03 ENCOUNTER — Other Ambulatory Visit: Payer: Self-pay | Admitting: Internal Medicine

## 2021-09-08 DIAGNOSIS — G4733 Obstructive sleep apnea (adult) (pediatric): Secondary | ICD-10-CM | POA: Diagnosis not present

## 2021-09-11 ENCOUNTER — Other Ambulatory Visit: Payer: Self-pay | Admitting: Internal Medicine

## 2021-09-18 DIAGNOSIS — E113393 Type 2 diabetes mellitus with moderate nonproliferative diabetic retinopathy without macular edema, bilateral: Secondary | ICD-10-CM | POA: Diagnosis not present

## 2021-09-28 ENCOUNTER — Other Ambulatory Visit: Payer: Self-pay | Admitting: Internal Medicine

## 2021-10-06 DIAGNOSIS — G4733 Obstructive sleep apnea (adult) (pediatric): Secondary | ICD-10-CM | POA: Diagnosis not present

## 2021-10-18 DIAGNOSIS — L298 Other pruritus: Secondary | ICD-10-CM | POA: Diagnosis not present

## 2021-10-18 DIAGNOSIS — D225 Melanocytic nevi of trunk: Secondary | ICD-10-CM | POA: Diagnosis not present

## 2021-10-18 DIAGNOSIS — L538 Other specified erythematous conditions: Secondary | ICD-10-CM | POA: Diagnosis not present

## 2021-10-18 DIAGNOSIS — R208 Other disturbances of skin sensation: Secondary | ICD-10-CM | POA: Diagnosis not present

## 2021-10-18 DIAGNOSIS — L820 Inflamed seborrheic keratosis: Secondary | ICD-10-CM | POA: Diagnosis not present

## 2021-10-18 DIAGNOSIS — L821 Other seborrheic keratosis: Secondary | ICD-10-CM | POA: Diagnosis not present

## 2021-10-18 DIAGNOSIS — L814 Other melanin hyperpigmentation: Secondary | ICD-10-CM | POA: Diagnosis not present

## 2021-10-18 DIAGNOSIS — L578 Other skin changes due to chronic exposure to nonionizing radiation: Secondary | ICD-10-CM | POA: Diagnosis not present

## 2021-10-30 ENCOUNTER — Encounter: Payer: Self-pay | Admitting: Gastroenterology

## 2021-11-06 ENCOUNTER — Other Ambulatory Visit: Payer: Self-pay | Admitting: Family

## 2021-11-06 ENCOUNTER — Ambulatory Visit: Payer: BC Managed Care – PPO | Admitting: Family

## 2021-11-06 ENCOUNTER — Encounter: Payer: Self-pay | Admitting: Family

## 2021-11-06 VITALS — BP 138/82 | HR 72 | Temp 98.7°F | Resp 16 | Ht 75.0 in | Wt 262.2 lb

## 2021-11-06 DIAGNOSIS — R5383 Other fatigue: Secondary | ICD-10-CM | POA: Diagnosis not present

## 2021-11-06 DIAGNOSIS — R7989 Other specified abnormal findings of blood chemistry: Secondary | ICD-10-CM

## 2021-11-06 DIAGNOSIS — M2550 Pain in unspecified joint: Secondary | ICD-10-CM | POA: Diagnosis not present

## 2021-11-06 DIAGNOSIS — M25561 Pain in right knee: Secondary | ICD-10-CM | POA: Diagnosis not present

## 2021-11-06 DIAGNOSIS — M25562 Pain in left knee: Secondary | ICD-10-CM

## 2021-11-06 DIAGNOSIS — K120 Recurrent oral aphthae: Secondary | ICD-10-CM | POA: Insufficient documentation

## 2021-11-06 DIAGNOSIS — R591 Generalized enlarged lymph nodes: Secondary | ICD-10-CM

## 2021-11-06 DIAGNOSIS — M898X1 Other specified disorders of bone, shoulder: Secondary | ICD-10-CM | POA: Diagnosis not present

## 2021-11-06 DIAGNOSIS — R590 Localized enlarged lymph nodes: Secondary | ICD-10-CM | POA: Insufficient documentation

## 2021-11-06 LAB — BASIC METABOLIC PANEL
BUN: 15 mg/dL (ref 6–23)
CO2: 25 mEq/L (ref 19–32)
Calcium: 9.7 mg/dL (ref 8.4–10.5)
Chloride: 100 mEq/L (ref 96–112)
Creatinine, Ser: 1.51 mg/dL — ABNORMAL HIGH (ref 0.40–1.50)
GFR: 50.96 mL/min — ABNORMAL LOW (ref >60.00)
Glucose, Bld: 207 mg/dL — ABNORMAL HIGH (ref 70–99)
Potassium: 3.7 mEq/L (ref 3.5–5.1)
Sodium: 139 mEq/L (ref 135–145)

## 2021-11-06 LAB — TSH: TSH: 0.59 u[IU]/mL (ref 0.35–5.50)

## 2021-11-06 LAB — CBC
HCT: 41.6 % (ref 39.0–52.0)
Hemoglobin: 14.8 g/dL (ref 13.0–17.0)
MCHC: 35.5 g/dL (ref 30.0–36.0)
MCV: 83.4 fl (ref 78.0–100.0)
Platelets: 180 10*3/uL (ref 150.0–400.0)
RBC: 4.99 Mil/uL (ref 4.22–5.81)
RDW: 14.3 % (ref 11.5–15.5)
WBC: 6.5 10*3/uL (ref 4.0–10.5)

## 2021-11-06 LAB — SEDIMENTATION RATE: Sed Rate: 8 mm/hr (ref 0–20)

## 2021-11-06 NOTE — Patient Instructions (Addendum)
Xrays have been ordered however xray currently down. Please call back tomorrow to see if up, and we can fit ya in.  ? ?Stop by the lab prior to leaving today. I will notify you of your results once received.  ? ?It was a pleasure seeing you today! Please do not hesitate to reach out with any questions and or concerns. ? ?Regards,  ? ?Ameenah Prosser ?FNP-C ? ?

## 2021-11-06 NOTE — Assessment & Plan Note (Signed)
Ordering chest xray, cbc, bmp and chest xray to r/o other etiologies ?

## 2021-11-06 NOTE — Progress Notes (Signed)
? ?Established Patient Office Visit ? ?Subjective:  ?Patient ID: Charles Phelps, male    DOB: May 24, 1966  Age: 56 y.o. MRN: [id number] ? ?CC:  ?Chief Complaint  ?Patient presents with  ? Mass  ?  Sore from pushing at it.  ? Knee Pain  ?  X couple of weeks  ? ? ?HPI ?Charles Phelps is here today with concerns.  ? ?Four days ago noticed a 'knot' on his right collar bone. Tender when he pushes on it all of the time, wife also noticed. States not normally enlarged.  ?No known injury or trama.  ?Was sick with URI back in January but not since.  ? ?Also concerned something on his tongue x 2 weeks. Tender, feels like papercut doesn't hurt when he eats.  ? ?Bil knee pain, ache often. Does feel stiffness in the am . No other joints that are giving him problems. Can put biofreeze at night but only slight relief, once moving around improves slightly. Has been for two weeks. No known injury or trauma recently. Did play basketball/baseball often in the past which may have caused some havic.  ? ?No sore throat ,no ear pain.  ?Hasn't felt any neck pain or enlarged lymph nodes.  ?No increased fatigue no sinus pressure. No cough.  ?One day during this week did feel some dizziness but then went away.  ? ?Wife has been sick with flu, slight improvement. Up  ? ?Past Medical History:  ?Diagnosis Date  ? Allergic rhinitis   ? Anxiety   ? Elevated LFTs 2000  ? fatty liver  ? GERD (gastroesophageal reflux disease)   ? Glucose intolerance (impaired glucose tolerance)   ? Gout   ? Hyperlipidemia   ? Hypertension   ? IBS (irritable bowel syndrome)   ? Obstructive sleep apnea   ? Osteoarthritis   ? ? ?No past surgical history on file. ? ?Family History  ?Problem Relation Age of Onset  ? Hypertension Mother   ? Diabetes Mother   ? Heart disease Father   ?     CABG at 67  ? Cancer Father   ?     lung  ? Diabetes Sister   ? Diabetes Brother   ? ? ?Social History  ? ?Socioeconomic History  ? Marital status: Married  ?  Spouse name: Not on  file  ? Number of children: 1  ? Years of education: Not on file  ? Highest education level: Not on file  ?Occupational History  ? Occupation: Investment banker, corporate / Geologist, engineering   ?  Comment: Retired  ?Tobacco Use  ? Smoking status: Never  ? Smokeless tobacco: Never  ?Substance and Sexual Activity  ? Alcohol use: Yes  ?  Comment: Occasionally  ? Drug use: No  ? Sexual activity: Not on file  ?Other Topics Concern  ? Not on file  ?Social History Narrative  ? Not on file  ? ?Social Determinants of Health  ? ?Financial Resource Strain: Not on file  ?Food Insecurity: Not on file  ?Transportation Needs: Not on file  ?Physical Activity: Not on file  ?Stress: Not on file  ?Social Connections: Not on file  ?Intimate Partner Violence: Not on file  ? ? ?Outpatient Medications Prior to Visit  ?Medication Sig Dispense Refill  ? cetirizine (ZYRTEC) 10 MG tablet Take 10 mg by mouth daily.    ? citalopram (CELEXA) 20 MG tablet TAKE 1 TABLET BY MOUTH  DAILY 90 tablet 3  ? colchicine 0.6  MG tablet TAKE 1 TABLET BY MOUTH  TWICE DAILY AS NEEDED 180 tablet 1  ? diltiazem (CARDIZEM CD) 120 MG 24 hr capsule TAKE 1 CAPSULE BY MOUTH  DAILY 90 capsule 3  ? FARXIGA 5 MG TABS tablet TAKE 1 TABLET BY MOUTH  DAILY BEFORE BREAKFAST 90 tablet 3  ? lisinopril (ZESTRIL) 20 MG tablet TAKE 1 TABLET BY MOUTH  DAILY 90 tablet 3  ? metFORMIN (GLUCOPHAGE-XR) 500 MG 24 hr tablet TAKE 2 TABLETS BY MOUTH  DAILY WITH BREAKFAST 180 tablet 3  ? Multiple Vitamin (MULTIVITAMIN WITH MINERALS) TABS tablet Take 1 tablet by mouth daily.    ? nitroGLYCERIN (NITRODUR - DOSED IN MG/24 HR) 0.2 mg/hr patch Apply 1/4 patch to affected area as directed by MD and change every 24 hours. 30 patch 2  ? omeprazole (PRILOSEC) 20 MG capsule TAKE 1 CAPSULE BY MOUTH  DAILY 90 capsule 3  ? XARELTO 20 MG TABS tablet TAKE 1 TABLET BY MOUTH  DAILY WITH SUPPER 90 tablet 3  ? fluticasone (FLONASE) 50 MCG/ACT nasal spray Place 2 sprays into both nostrils daily. (Patient not taking: Reported on  11/06/2021) 16 g 0  ? ?No facility-administered medications prior to visit.  ? ? ?Allergies  ?Allergen Reactions  ? Atorvastatin Other (See Comments)  ?  Memory problems  ? ? ?ROS ?Review of Systems  ?Constitutional:  Positive for fatigue. Negative for chills, fever and unexpected weight change.  ?HENT:  Negative for congestion, postnasal drip and sinus pressure.   ?     Tongue lesion tender ?  ?Eyes:  Negative for visual disturbance.  ?Respiratory:  Negative for cough, chest tightness and shortness of breath.   ?Cardiovascular:  Negative for chest pain.  ?Gastrointestinal:  Negative for abdominal pain.  ?Genitourinary:  Negative for difficulty urinating.  ?Musculoskeletal:  Positive for arthralgias (painful left collar bone with bony growth appearing new). Negative for neck pain.  ?     Bil knee pain x 2 weeks ?  ?Skin:  Negative for rash.  ?Neurological:  Positive for dizziness. Negative for headaches.  ? ?  ?Objective:  ?  ?Physical Exam ?Constitutional:   ?   General: He is awake. He is not in acute distress. ?   Appearance: Normal appearance. He is not ill-appearing.  ?HENT:  ?   Right Ear: Tympanic membrane normal.  ?   Left Ear: Tympanic membrane normal.  ?   Nose: Nose normal.  ?   Right Turbinates: Not enlarged or swollen.  ?   Left Turbinates: Not enlarged or swollen.  ?   Right Sinus: No maxillary sinus tenderness or frontal sinus tenderness.  ?   Left Sinus: No maxillary sinus tenderness or frontal sinus tenderness.  ?   Mouth/Throat:  ?   Mouth: Mucous membranes are moist.  ?   Tongue: Lesions (small white raised pinpoint head with surrounding erythema underside of tongue, left) present.  ?   Pharynx: Posterior oropharyngeal erythema present. No pharyngeal swelling or oropharyngeal exudate.  ?   Tonsils: No tonsillar exudate. 0 on the right. 0 on the left.  ?Eyes:  ?   Extraocular Movements: Extraocular movements intact.  ?   Pupils: Pupils are equal, round, and reactive to light.  ?Cardiovascular:  ?    Rate and Rhythm: Normal rate and regular rhythm.  ?Pulmonary:  ?   Effort: Pulmonary effort is normal.  ?   Breath sounds: Normal breath sounds. No wheezing.  ?Musculoskeletal:  ?  Arms: ? ?Lymphadenopathy:  ?   Cervical: Cervical adenopathy present.  ?   Left cervical: Superficial cervical adenopathy (tender fixed) present.  ?   Upper Body:  ?   Right upper body: Axillary adenopathy present.  ?   Left upper body: Supraclavicular adenopathy and axillary adenopathy present.  ?   Lower Body: No right inguinal adenopathy. No left inguinal adenopathy.  ?Neurological:  ?   Mental Status: He is alert.  ? ? ?BP 138/82   Pulse 72   Temp 98.7 ?F (37.1 ?C)   Resp 16   Ht '6\' 3"'$  (1.905 m)   Wt 262 lb 4 oz (119 kg)   SpO2 97%   BMI 32.78 kg/m?  ?Wt Readings from Last 3 Encounters:  ?11/06/21 262 lb 4 oz (119 kg)  ?08/07/21 263 lb 2 oz (119.4 kg)  ?06/13/21 265 lb (120.2 kg)  ? ? ? ?Health Maintenance Due  ?Topic Date Due  ? HIV Screening  Never done  ? Hepatitis C Screening  Never done  ? Zoster Vaccines- Shingrix (1 of 2) Never done  ? OPHTHALMOLOGY EXAM  02/17/2021  ? COLONOSCOPY (Pts 45-76yr Insurance coverage will need to be confirmed)  06/14/2021  ? ? ?There are no preventive care reminders to display for this patient. ? ?Lab Results  ?Component Value Date  ? TSH 0.59 11/06/2021  ? ?Lab Results  ?Component Value Date  ? WBC 6.5 11/06/2021  ? HGB 14.8 11/06/2021  ? HCT 41.6 11/06/2021  ? MCV 83.4 11/06/2021  ? PLT 180.0 11/06/2021  ? ?Lab Results  ?Component Value Date  ? NA 139 11/06/2021  ? K 3.7 11/06/2021  ? CO2 25 11/06/2021  ? GLUCOSE 207 (H) 11/06/2021  ? BUN 15 11/06/2021  ? CREATININE 1.51 (H) 11/06/2021  ? BILITOT 1.4 (H) 11/24/2020  ? ALKPHOS 67 11/24/2020  ? AST 42 (H) 11/24/2020  ? ALT 48 11/24/2020  ? PROT 7.3 11/24/2020  ? ALBUMIN 4.5 11/24/2020  ? CALCIUM 9.7 11/06/2021  ? ANIONGAP 13 11/24/2016  ? GFR 50.96 (L) 11/06/2021  ? ?Lab Results  ?Component Value Date  ? HGBA1C 6.4 (A) 06/13/2021  ? ? ?   ?Assessment & Plan:  ? ?Problem List Items Addressed This Visit   ? ?  ? Immune and Lymphatic  ? Lymphadenopathy of head and neck  ?  Ordering chest xray, cbc, bmp and chest xray to r/o other etiologies ?  ?

## 2021-11-06 NOTE — Assessment & Plan Note (Signed)
Order for xray chest xray to r/o other etiologies with lymphadenathy ?Pt advised to monitor lymph node, if >2 weeks will ultrasound ?

## 2021-11-06 NOTE — Assessment & Plan Note (Signed)
oragel mouth wash recommended ? ?

## 2021-11-06 NOTE — Assessment & Plan Note (Signed)
Xray bil knees ordered ?Compression sleeves ?Tylenol prn ?Ice/heat ?

## 2021-11-06 NOTE — Assessment & Plan Note (Signed)
Ana rf and sed rate ordered pending results ?

## 2021-11-06 NOTE — Assessment & Plan Note (Signed)
Xray clavicle ordered pending results ?

## 2021-11-06 NOTE — Progress Notes (Signed)
igue ?

## 2021-11-07 ENCOUNTER — Telehealth: Payer: Self-pay

## 2021-11-07 ENCOUNTER — Other Ambulatory Visit: Payer: BC Managed Care – PPO

## 2021-11-07 ENCOUNTER — Other Ambulatory Visit: Payer: Self-pay | Admitting: Family

## 2021-11-07 ENCOUNTER — Ambulatory Visit (INDEPENDENT_AMBULATORY_CARE_PROVIDER_SITE_OTHER)
Admission: RE | Admit: 2021-11-07 | Discharge: 2021-11-07 | Disposition: A | Discharge status: Home / Self Care | Payer: BC Managed Care – PPO | Source: Ambulatory Visit | Attending: Family | Admitting: Family

## 2021-11-07 DIAGNOSIS — M25562 Pain in left knee: Secondary | ICD-10-CM | POA: Diagnosis not present

## 2021-11-07 DIAGNOSIS — R599 Enlarged lymph nodes, unspecified: Secondary | ICD-10-CM | POA: Diagnosis not present

## 2021-11-07 DIAGNOSIS — M25561 Pain in right knee: Secondary | ICD-10-CM | POA: Diagnosis not present

## 2021-11-07 DIAGNOSIS — R591 Generalized enlarged lymph nodes: Secondary | ICD-10-CM

## 2021-11-07 DIAGNOSIS — M898X1 Other specified disorders of bone, shoulder: Secondary | ICD-10-CM

## 2021-11-07 DIAGNOSIS — R2232 Localized swelling, mass and lump, left upper limb: Secondary | ICD-10-CM | POA: Diagnosis not present

## 2021-11-07 LAB — B12 AND FOLATE PANEL
Folate: 8.5 ng/mL (ref >5.9)
Vitamin B-12: 145 pg/mL — ABNORMAL LOW (ref 211–911)

## 2021-11-07 NOTE — Telephone Encounter (Signed)
New message   Patient returning call back the [place]

## 2021-11-07 NOTE — Telephone Encounter (Signed)
-----   Message from Eugenia Pancoast, Ney sent at 11/07/2021  1:08 PM EDT ----- ?You are found to have B12 deficiency. ?Vitamin B12 very low, please set up for B12 injections, 1000 mcg IM once monthly for three months, also schedule 3 month f/u appt to repeat labs and f/u in office. Recommend also oral otc 1000 mcg once daily vitamin B12 ? ?Kidney function is stable creatinine very slightly elevated work on hydrating appropriately throughout the day with an adequate water intake ? ?Rheumatoid factor and thyroid was okay inflammatory markers are negative white blood cells to indicate ongoing infection ? ?Pending HIV and ANA results ? ?

## 2021-11-07 NOTE — Telephone Encounter (Signed)
Left message to return call to our office. Need to set pt up for nurse visit for B-12. ?

## 2021-11-07 NOTE — Progress Notes (Signed)
You are found to have B12 deficiency. ?Vitamin B12 very low, please set up for B12 injections, 1000 mcg IM once monthly for three months, also schedule 3 month f/u appt to repeat labs and f/u in office. Recommend also oral otc 1000 mcg once daily vitamin B12 ? ?Kidney function is stable creatinine very slightly elevated work on hydrating appropriately throughout the day with an adequate water intake ? ?Rheumatoid factor and thyroid was okay inflammatory markers are negative white blood cells to indicate ongoing infection ? ?Pending HIV and ANA results ?

## 2021-11-08 LAB — HIV ANTIBODY (ROUTINE TESTING W REFLEX): HIV 1&2 Ab, 4th Generation: NONREACTIVE

## 2021-11-09 LAB — RHEUMATOID FACTOR: Rheumatoid fact SerPl-aCnc: <14 IU/mL (ref <14)

## 2021-11-09 LAB — ANA: Anti Nuclear Antibody (ANA): NEGATIVE

## 2021-11-15 ENCOUNTER — Ambulatory Visit (INDEPENDENT_AMBULATORY_CARE_PROVIDER_SITE_OTHER): Payer: BC Managed Care – PPO

## 2021-11-15 DIAGNOSIS — E538 Deficiency of other specified B group vitamins: Secondary | ICD-10-CM

## 2021-11-15 MED ORDER — CYANOCOBALAMIN 1000 MCG/ML IJ SOLN
1000.0000 ug | Freq: Once | INTRAMUSCULAR | Status: AC
  Administered 2021-11-15: 1000 ug via INTRAMUSCULAR

## 2021-11-15 NOTE — Progress Notes (Signed)
Per orders of Dr. Cody, injection of vit B12 given by Athanasia Stanwood. Patient tolerated injection well.  

## 2021-11-27 ENCOUNTER — Ambulatory Visit: Payer: BC Managed Care – PPO | Admitting: Gastroenterology

## 2021-12-06 ENCOUNTER — Ambulatory Visit: Payer: BC Managed Care – PPO | Admitting: Gastroenterology

## 2021-12-06 ENCOUNTER — Encounter: Payer: Self-pay | Admitting: Gastroenterology

## 2021-12-06 VITALS — BP 142/80 | HR 74 | Ht 75.0 in | Wt 263.8 lb

## 2021-12-06 DIAGNOSIS — Z7901 Long term (current) use of anticoagulants: Secondary | ICD-10-CM | POA: Diagnosis not present

## 2021-12-06 DIAGNOSIS — Z860100 Personal history of colon polyps, unspecified: Secondary | ICD-10-CM | POA: Insufficient documentation

## 2021-12-06 MED ORDER — NA SULFATE-K SULFATE-MG SULF 17.5-3.13-1.6 GM/177ML PO SOLN: 1.0000 | Freq: Once | ORAL | 0 refills | Status: AC

## 2021-12-06 NOTE — Patient Instructions (Addendum)
You have been scheduled for a colonoscopy. Please follow written instructions given to you at your visit today.  ?Please pick up your prep supplies at the pharmacy within the next 1-3 days. ?If you use inhalers (even only as needed), please bring them with you on the day of your procedure. ? ?If you are age 59 or younger, your body mass index should be between 19-25. Your Body mass index is 32.97 kg/m?Marland Kitchen If this is out of the aformentioned range listed, please consider follow up with your Primary Care Provider.  ? ?________________________________________________________ ? ?The  GI providers would like to encourage you to use [facility] to communicate with providers for non-urgent requests or questions.  Due to long hold times on the telephone, sending your provider a message by [facility] may be a faster and more efficient way to get a response.  Please allow 48 business hours for a response.  Please remember that this is for non-urgent requests.  ?_______________________________________________________ ? ?Thank you for entrusting me with your care and for choosing [facility], ? ?Alonza Bogus, P.A.-C. ? ?

## 2021-12-06 NOTE — Progress Notes (Signed)
Addendum: Reviewed and agree with assessment and management plan. Atom Solivan M, MD  

## 2021-12-06 NOTE — Progress Notes (Signed)
? ? ? ?12/06/2021 ?Charles Phelps ?[id number] ?1966/03/16 ? ? ?HISTORY OF PRESENT ILLNESS: This is a 55 year old male who is a patient of Dr. Vena Rua.  He was seen in November 2021 and scheduled for an EGD and colonoscopy.  Colonoscopy revealed several polyps as below.  It was recommended that he have a repeat in 1 year.  He is on Xarelto for history of atrial fibrillation as prescribed by Dr. Silvio Pate. ? ?Colonoscopy 05/2020: ? ?- One 5 mm polyp in the ascending colon, removed with a cold snare. Resected and retrieved. ?- Four 5 to 8 mm polyps in the transverse colon, removed with a cold snare. Resected and ?retrieved. ?- Two 6 to 7 mm polyps in the descending colon, removed with a cold snare. Resected andretrieved. ?- Four 4 to 8 mm polyps in the sigmoid colon, removed with a cold snare. Resected and ?retrieved. ?- One 1 mm polyp in the rectum, removed with a cold biopsy forceps. Resected and retrieved. ?- Diverticulosis in the sigmoid colon. ?- The distal rectum and anal verge are normal on retroflexion view. ? ?1. Surgical [P], gastric antrum & body ?- GASTRIC ANTRAL MUCOSA WITH MILD REACTIVE GASTROPATHY. ?- GASTRIC OXYNTIC MUCOSA WITH MILD CHRONIC GASTRITIS. ?- WARTHIN-STARRY STAIN IS NEGATIVE FOR HELICOBACTER PYLORI. ?2. Surgical [P], colon, ascending, polyp ?- TUBULAR ADENOMA. ?- NEGATIVE FOR HIGH GRADE DYSPLASIA. ?3. Surgical [P], colon, transverse, polyp (4) ?- TUBULAR ADENOMA (X4). ?- NEGATIVE FOR HIGH GRADE DYSPLASIA. ?4. Surgical [P], colon, descending x 2, sigmoid x 4, rectal x 1, polyp (7) ?- TUBULAR ADENOMA (X7). ?- NEGATIVE FOR HIGH GRADE DYSPLASIA. ? ? ? ?Past Medical History:  ?Diagnosis Date  ? Allergic rhinitis   ? Anxiety   ? Arthritis   ? Atrial fibrillation ([place])   ? Diabetes ([place])   ? Elevated LFTs 2000  ? fatty liver  ? GERD (gastroesophageal reflux disease)   ? Glucose intolerance (impaired glucose tolerance)   ? Gout   ? Hyperlipidemia   ? Hypertension   ? IBS (irritable bowel syndrome)    ? Obstructive sleep apnea   ? Osteoarthritis   ? ?Past Surgical History:  ?Procedure Laterality Date  ? NO PAST SURGERIES    ? ? reports that he has never smoked. He has never used smokeless tobacco. He reports current alcohol use. He reports that he does not use drugs. ?family history includes Cancer in his father; Colon polyps in his father; Diabetes in his brother, mother, and sister; Heart disease in his father; Hypertension in his mother. ?Allergies  ?Allergen Reactions  ? Atorvastatin Other (See Comments)  ?  Memory problems  ? ? ?  ?Outpatient Encounter Medications as of 12/06/2021  ?Medication Sig  ? cetirizine (ZYRTEC) 10 MG tablet Take 10 mg by mouth daily.  ? citalopram (CELEXA) 20 MG tablet TAKE 1 TABLET BY MOUTH  DAILY  ? colchicine 0.6 MG tablet TAKE 1 TABLET BY MOUTH  TWICE DAILY AS NEEDED  ? diltiazem (CARDIZEM CD) 120 MG 24 hr capsule TAKE 1 CAPSULE BY MOUTH  DAILY  ? FARXIGA 5 MG TABS tablet TAKE 1 TABLET BY MOUTH  DAILY BEFORE BREAKFAST  ? lisinopril (ZESTRIL) 20 MG tablet TAKE 1 TABLET BY MOUTH  DAILY  ? metFORMIN (GLUCOPHAGE-XR) 500 MG 24 hr tablet TAKE 2 TABLETS BY MOUTH  DAILY WITH BREAKFAST  ? Multiple Vitamin (MULTIVITAMIN WITH MINERALS) TABS tablet Take 1 tablet by mouth daily.  ? nitroGLYCERIN (NITRODUR - DOSED IN MG/24 HR) 0.2 mg/hr patch Apply  1/4 patch to affected area as directed by MD and change every 24 hours.  ? omeprazole (PRILOSEC) 20 MG capsule TAKE 1 CAPSULE BY MOUTH  DAILY  ? XARELTO 20 MG TABS tablet TAKE 1 TABLET BY MOUTH  DAILY WITH SUPPER  ? ?No facility-administered encounter medications on file as of 12/06/2021.  ? ? ? ?REVIEW OF SYSTEMS  : All other systems reviewed and negative except where noted in the History of Present Illness. ? ? ?PHYSICAL EXAM: ?BP (!) 142/80   Pulse 74   Ht '6\' 3"'$  (1.905 m)   Wt 263 lb 12.8 oz (119.7 kg)   SpO2 98%   BMI 32.97 kg/m?  ?General: Well developed white male in no acute distress ?Head: Normocephalic and atraumatic ?Eyes:  Sclerae  anicteric, conjunctiva pink. ?Ears: Normal auditory acuity ?Lungs: Clear throughout to auscultation; no W/R/R. ?Heart: Regular rate and rhythm; no M/R/G. ?Abdomen: Soft, non-distended.  BS present.  Non-tender. ?Rectal:  Will be done at the time of colonoscopy. ?Musculoskeletal: Symmetrical with no gross deformities  ?Skin: No lesions on visible extremities ?Extremities: No edema  ?Neurological: Alert oriented x 4, grossly non-focal ?Psychological:  Alert and cooperative. Normal mood and affect ? ?ASSESSMENT AND PLAN: ?*Personal history of colon polyps: Had several polyps removed in November 2021.  It was recommended he have a repeat in 1 year.  He is here to schedule that now.  We will schedule with Dr. Hilarie Fredrickson ?*Chronic anticoagulation with Xarelto for history of atrial fibrillation:  Will hold Xarelto for 1 day prior to endoscopic procedures - will instruct when and how to resume after procedure. Benefits and risks of procedure explained including risks of bleeding, perforation, infection, missed lesions, reactions to medications and possible need for hospitalization and surgery for complications. Additional rare but real risk of stroke or other vascular clotting events off of Xarelto also explained and need to seek urgent help if any signs of these problems occur. Will communicate by phone or EMR with patient's  prescribing provider, Dr. Silvio Pate, to confirm that holding Xarelto is reasonable in this case.   ? ? ?CC:  Venia Carbon, MD ? ?  ?

## 2021-12-12 ENCOUNTER — Ambulatory Visit (INDEPENDENT_AMBULATORY_CARE_PROVIDER_SITE_OTHER): Payer: BC Managed Care – PPO | Admitting: Internal Medicine

## 2021-12-12 ENCOUNTER — Encounter: Payer: Self-pay | Admitting: Internal Medicine

## 2021-12-12 VITALS — BP 128/84 | HR 66 | Ht 76.0 in | Wt 261.0 lb

## 2021-12-12 DIAGNOSIS — Z125 Encounter for screening for malignant neoplasm of prostate: Secondary | ICD-10-CM

## 2021-12-12 DIAGNOSIS — F39 Unspecified mood [affective] disorder: Secondary | ICD-10-CM | POA: Diagnosis not present

## 2021-12-12 DIAGNOSIS — Z0000 Encounter for general adult medical examination without abnormal findings: Secondary | ICD-10-CM | POA: Diagnosis not present

## 2021-12-12 DIAGNOSIS — E1159 Type 2 diabetes mellitus with other circulatory complications: Secondary | ICD-10-CM

## 2021-12-12 DIAGNOSIS — I480 Paroxysmal atrial fibrillation: Secondary | ICD-10-CM

## 2021-12-12 DIAGNOSIS — Z789 Other specified health status: Secondary | ICD-10-CM

## 2021-12-12 DIAGNOSIS — E538 Deficiency of other specified B group vitamins: Secondary | ICD-10-CM

## 2021-12-12 LAB — COMPREHENSIVE METABOLIC PANEL
ALT: 37 U/L (ref 0–53)
AST: 29 U/L (ref 0–37)
Albumin: 4.7 g/dL (ref 3.5–5.2)
Alkaline Phosphatase: 74 U/L (ref 39–117)
BUN: 21 mg/dL (ref 6–23)
CO2: 31 mEq/L (ref 19–32)
Calcium: 9.8 mg/dL (ref 8.4–10.5)
Chloride: 99 mEq/L (ref 96–112)
Creatinine, Ser: 1.45 mg/dL (ref 0.40–1.50)
GFR: 53.46 mL/min — ABNORMAL LOW (ref >60.00)
Glucose, Bld: 183 mg/dL — ABNORMAL HIGH (ref 70–99)
Potassium: 3.7 mEq/L (ref 3.5–5.1)
Sodium: 140 mEq/L (ref 135–145)
Total Bilirubin: 1.1 mg/dL (ref 0.2–1.2)
Total Protein: 7.2 g/dL (ref 6.0–8.3)

## 2021-12-12 LAB — CBC
HCT: 43.7 % (ref 39.0–52.0)
Hemoglobin: 14.9 g/dL (ref 13.0–17.0)
MCHC: 34.2 g/dL (ref 30.0–36.0)
MCV: 85.3 fl (ref 78.0–100.0)
Platelets: 161 10*3/uL (ref 150.0–400.0)
RBC: 5.12 Mil/uL (ref 4.22–5.81)
RDW: 14.4 % (ref 11.5–15.5)
WBC: 6.7 10*3/uL (ref 4.0–10.5)

## 2021-12-12 LAB — LIPID PANEL
Cholesterol: 211 mg/dL — ABNORMAL HIGH (ref 0–200)
HDL: 41.9 mg/dL (ref >39.00)
NonHDL: 169.11
Total CHOL/HDL Ratio: 5
Triglycerides: 389 mg/dL — ABNORMAL HIGH (ref 0.0–149.0)
VLDL: 77.8 mg/dL — ABNORMAL HIGH (ref 0.0–40.0)

## 2021-12-12 LAB — LDL CHOLESTEROL, DIRECT: Direct LDL: 126 mg/dL

## 2021-12-12 LAB — PSA: PSA: 0.98 ng/mL (ref 0.10–4.00)

## 2021-12-12 LAB — HM DIABETES FOOT EXAM

## 2021-12-12 LAB — MICROALBUMIN / CREATININE URINE RATIO
Creatinine,U: 124.1 mg/dL
Microalb Creat Ratio: 9.6 mg/g (ref 0.0–30.0)
Microalb, Ur: 11.9 mg/dL — ABNORMAL HIGH (ref 0.0–1.9)

## 2021-12-12 LAB — HEMOGLOBIN A1C: Hgb A1c MFr Bld: 7.2 % — ABNORMAL HIGH (ref 4.6–6.5)

## 2021-12-12 NOTE — Assessment & Plan Note (Signed)
Hopefully still good control on farxiga 5 and metformin 1000 daily ?Will increase farxiga if over 7.5% ? ?

## 2021-12-12 NOTE — Assessment & Plan Note (Signed)
Will do 3 injections then continue the oral ?Recheck level at visit in 6 months ?

## 2021-12-12 NOTE — Assessment & Plan Note (Addendum)
Regular now ?Is on xarelto 20 daily and diltiazem 120 ?

## 2021-12-12 NOTE — Assessment & Plan Note (Signed)
Has cognitive decline ?

## 2021-12-12 NOTE — Assessment & Plan Note (Signed)
Healthy ?Discussed increasing exercise ?Prefers no COVID, flu or shingrix vaccines ?Repeat colon due soon ?Will check PSA after discussion ?

## 2021-12-12 NOTE — Progress Notes (Signed)
? ?Subjective:  ? ? Patient ID: Charles Phelps, male    DOB: [birth year], 56 y.o.   MRN: [id number] ? ?HPI ?Here for physical ? ?Feels better ?Right knee is better now ?Concerned about prominent proximal left clavicle ? ?Not checking sugars ?Hard to stop the bleeding when he pricks his fingers ?Trying to walk more lately ? ?No palpitations ?No chest pain or SOB ? ?Semi-retired ?Works part time at [employer] now ? ?Current Outpatient Medications on File Prior to Visit  ?Medication Sig Dispense Refill  ? cetirizine (ZYRTEC) 10 MG tablet Take 10 mg by mouth daily.    ? citalopram (CELEXA) 20 MG tablet TAKE 1 TABLET BY MOUTH  DAILY 90 tablet 3  ? colchicine 0.6 MG tablet TAKE 1 TABLET BY MOUTH  TWICE DAILY AS NEEDED 180 tablet 1  ? diltiazem (CARDIZEM CD) 120 MG 24 hr capsule TAKE 1 CAPSULE BY MOUTH  DAILY 90 capsule 3  ? FARXIGA 5 MG TABS tablet TAKE 1 TABLET BY MOUTH  DAILY BEFORE BREAKFAST 90 tablet 3  ? lisinopril (ZESTRIL) 20 MG tablet TAKE 1 TABLET BY MOUTH  DAILY 90 tablet 3  ? metFORMIN (GLUCOPHAGE-XR) 500 MG 24 hr tablet TAKE 2 TABLETS BY MOUTH  DAILY WITH BREAKFAST 180 tablet 3  ? Multiple Vitamin (MULTIVITAMIN WITH MINERALS) TABS tablet Take 1 tablet by mouth daily.    ? nitroGLYCERIN (NITRODUR - DOSED IN MG/24 HR) 0.2 mg/hr patch Apply 1/4 patch to affected area as directed by MD and change every 24 hours. 30 patch 2  ? omeprazole (PRILOSEC) 20 MG capsule TAKE 1 CAPSULE BY MOUTH  DAILY 90 capsule 3  ? XARELTO 20 MG TABS tablet TAKE 1 TABLET BY MOUTH  DAILY WITH SUPPER 90 tablet 3  ? ?No current facility-administered medications on file prior to visit.  ? ? ?Allergies  ?Allergen Reactions  ? Atorvastatin Other (See Comments)  ?  Memory problems  ? ? ?Past Medical History:  ?Diagnosis Date  ? Allergic rhinitis   ? Anxiety   ? Arthritis   ? Atrial fibrillation ([place])   ? Diabetes ([place])   ? Elevated LFTs 2000  ? fatty liver  ? GERD (gastroesophageal reflux disease)   ? Glucose intolerance (impaired glucose  tolerance)   ? Gout   ? Hyperlipidemia   ? Hypertension   ? IBS (irritable bowel syndrome)   ? Obstructive sleep apnea   ? Osteoarthritis   ? ? ?Past Surgical History:  ?Procedure Laterality Date  ? NO PAST SURGERIES    ? ? ?Family History  ?Problem Relation Age of Onset  ? Hypertension Mother   ? Diabetes Mother   ? Heart disease Father   ?     CABG at 69  ? Cancer Father   ?     lung  ? Colon polyps Father   ? Diabetes Sister   ? Diabetes Brother   ? Colon cancer Neg Hx   ? Stomach cancer Neg Hx   ? Esophageal cancer Neg Hx   ? ? ?Social History  ? ?Socioeconomic History  ? Marital status: Married  ?  Spouse name: Not on file  ? Number of children: 1  ? Years of education: Not on file  ? Highest education level: Not on file  ?Occupational History  ? Occupation: Investment banker, corporate / Geologist, engineering   ?  Comment: Retired  ? Occupation: [employer]  ?  Comment: part time  ?Tobacco Use  ? Smoking status: Never  ?  Smokeless tobacco: Never  ?Vaping Use  ? Vaping Use: Never used  ?Substance and Sexual Activity  ? Alcohol use: Yes  ?  Comment: Occasionally  ? Drug use: No  ? Sexual activity: Not on file  ?Other Topics Concern  ? Not on file  ?Social History Narrative  ? Not on file  ? ?Social Determinants of Health  ? ?Financial Resource Strain: Not on file  ?Food Insecurity: Not on file  ?Transportation Needs: Not on file  ?Physical Activity: Not on file  ?Stress: Not on file  ?Social Connections: Not on file  ?Intimate Partner Violence: Not on file  ? ?Review of Systems  ?Constitutional:  Negative for fatigue.  ?     Weight down slightly ?Wears seat belt  ?HENT:  Negative for dental problem, hearing loss and tinnitus.   ?     Keeps up with dentist  ?Eyes:  Negative for visual disturbance.  ?     No diplopia or unilateral vision loss  ?Respiratory:  Negative for cough, chest tightness and shortness of breath.   ?Cardiovascular:  Negative for chest pain, palpitations and leg swelling.  ?Gastrointestinal:  Negative for blood in  stool and constipation.  ?     No heartburn on medication  ?Endocrine: Negative for polydipsia and polyuria.  ?Genitourinary:  Negative for difficulty urinating and urgency.  ?     No sexual problems  ?Musculoskeletal:  Positive for arthralgias. Negative for back pain and joint swelling.  ?Skin:  Negative for rash.  ?     Keeps up with derm---did have some cryotherapy  ?Allergic/Immunologic: Positive for environmental allergies. Negative for immunocompromised state.  ?     Controlled with zyrtec  ?Neurological:  Negative for dizziness, syncope, light-headedness and headaches.  ?Hematological:  Negative for adenopathy. Does not bruise/bleed easily.  ?Psychiatric/Behavioral:  Negative for dysphoric mood and sleep disturbance. The patient is not nervous/anxious.   ?     Sleeps well with CPAP nightly  ? ?   ?Objective:  ? Physical Exam ?Constitutional:   ?   Appearance: Normal appearance.  ?HENT:  ?   Mouth/Throat:  ?   Pharynx: No oropharyngeal exudate or posterior oropharyngeal erythema.  ?Eyes:  ?   Conjunctiva/sclera: Conjunctivae normal.  ?   Pupils: Pupils are equal, round, and reactive to light.  ?Cardiovascular:  ?   Rate and Rhythm: Normal rate and regular rhythm.  ?   Pulses: Normal pulses.  ?   Heart sounds: No murmur heard. ?  No gallop.  ?Pulmonary:  ?   Effort: Pulmonary effort is normal.  ?   Breath sounds: Normal breath sounds. No wheezing or rales.  ?   Comments: Prominent left proximal clavicle ?Abdominal:  ?   Palpations: Abdomen is soft.  ?   Tenderness: There is no abdominal tenderness.  ?Musculoskeletal:  ?   Cervical back: Neck supple.  ?   Right lower leg: No edema.  ?   Left lower leg: No edema.  ?Lymphadenopathy:  ?   Cervical: No cervical adenopathy.  ?Skin: ?   Findings: No rash.  ?   Comments: No foot lesions  ?Neurological:  ?   General: No focal deficit present.  ?   Mental Status: He is alert and oriented to person, place, and time.  ?   Comments: Normal sensation in feet  ?Psychiatric:      ?   Mood and Affect: Mood normal.     ?   Behavior: Behavior normal.  ?  ? ? ? ? ?   ?  Assessment & Plan:  ? ?

## 2021-12-12 NOTE — Assessment & Plan Note (Signed)
Doing well on citalopram ?

## 2021-12-17 ENCOUNTER — Telehealth: Payer: Self-pay | Admitting: *Deleted

## 2021-12-17 NOTE — Telephone Encounter (Signed)
   Charles Phelps [birth year] [id number]  Dear Dr. Silvio Pate:  We have scheduled the above named patient for a(n) colonoscopy procedure. Our records show that (s)he is on anticoagulation therapy.  Please advise as to whether the patient may come off their therapy of Xarelto 1 day prior to their procedure which is scheduled for 01/07/22.  Please route your response to Caryl Asp, [place] or fax response to [phone].  Sincerely,   Caryl Asp, [facility]

## 2021-12-18 NOTE — Telephone Encounter (Signed)
===  View-only below this line=== ----- Message ----- From: Venia Carbon, MD Sent: 12/17/2021   1:14 PM EDT To: Horris Latino, CMA  Yes---it is okay for him to come off the medication 1 day before the colonoscopy and resume after.  Drusilla Kanner

## 2021-12-18 NOTE — Telephone Encounter (Signed)
Left message for patient to call back  

## 2021-12-19 NOTE — Telephone Encounter (Signed)
Patient informed to hold Xarelto.

## 2021-12-20 ENCOUNTER — Ambulatory Visit (INDEPENDENT_AMBULATORY_CARE_PROVIDER_SITE_OTHER): Payer: BC Managed Care – PPO | Admitting: Family Medicine

## 2021-12-20 DIAGNOSIS — E538 Deficiency of other specified B group vitamins: Secondary | ICD-10-CM

## 2021-12-20 MED ORDER — CYANOCOBALAMIN 1000 MCG/ML IJ SOLN
1000.0000 ug | Freq: Once | INTRAMUSCULAR | Status: AC
  Administered 2021-12-20: 1000 ug via INTRAMUSCULAR

## 2021-12-20 NOTE — Progress Notes (Signed)
Per orders of Dr. Einar Pheasant, injection of Cyanocobalamin 1000 mcg given by Eugene Isadore L Kiptyn Rafuse. Patient tolerated injection well.

## 2022-01-01 ENCOUNTER — Encounter: Payer: Self-pay | Admitting: Internal Medicine

## 2022-01-07 ENCOUNTER — Encounter: Payer: Self-pay | Admitting: Internal Medicine

## 2022-01-07 ENCOUNTER — Ambulatory Visit (AMBULATORY_SURGERY_CENTER): Payer: BC Managed Care – PPO | Admitting: Internal Medicine

## 2022-01-07 VITALS — BP 154/86 | HR 64 | Temp 98.6°F | Resp 12 | Ht 76.0 in | Wt 261.0 lb

## 2022-01-07 DIAGNOSIS — Z86010 Personal history of colonic polyps: Secondary | ICD-10-CM | POA: Diagnosis not present

## 2022-01-07 DIAGNOSIS — D125 Benign neoplasm of sigmoid colon: Secondary | ICD-10-CM | POA: Diagnosis not present

## 2022-01-07 DIAGNOSIS — Z1211 Encounter for screening for malignant neoplasm of colon: Secondary | ICD-10-CM | POA: Diagnosis not present

## 2022-01-07 DIAGNOSIS — D122 Benign neoplasm of ascending colon: Secondary | ICD-10-CM | POA: Diagnosis not present

## 2022-01-07 DIAGNOSIS — D120 Benign neoplasm of cecum: Secondary | ICD-10-CM | POA: Diagnosis not present

## 2022-01-07 DIAGNOSIS — D124 Benign neoplasm of descending colon: Secondary | ICD-10-CM

## 2022-01-07 DIAGNOSIS — Z09 Encounter for follow-up examination after completed treatment for conditions other than malignant neoplasm: Secondary | ICD-10-CM | POA: Diagnosis not present

## 2022-01-07 MED ORDER — SODIUM CHLORIDE 0.9 % IV SOLN: 500.0000 mL | Freq: Once | INTRAVENOUS | Status: DC

## 2022-01-07 NOTE — Progress Notes (Signed)
Called to room to assist during endoscopic procedure.  Patient ID and intended procedure confirmed with present staff. Received instructions for my participation in the procedure from the performing physician.  

## 2022-01-07 NOTE — Progress Notes (Signed)
Vss nad trans to pacu °

## 2022-01-07 NOTE — Progress Notes (Signed)
GASTROENTEROLOGY PROCEDURE H&P NOTE   Primary Care Physician: Venia Carbon, MD    Reason for Procedure:  History of multiple adenomatous colon polyps  Plan:    Surveillance colonoscopy  Patient is appropriate for endoscopic procedure(s) in the ambulatory ([place]) setting.  The nature of the procedure, as well as the risks, benefits, and alternatives were carefully and thoroughly reviewed with the patient. Ample time for discussion and questions allowed. The patient understood, was satisfied, and agreed to proceed.     HPI: Charles Phelps is a 56 y.o. male who presents for surveillance colonoscopy.  Medical history as below.  Tolerated the prep.  No recent chest pain or shortness of breath.  No abdominal pain today.  Xarelto on hold x48 hours   Past Medical History:  Diagnosis Date   Allergic rhinitis    Anxiety    Arthritis    Atrial fibrillation (HCC)    Diabetes ([place])    Elevated LFTs 2000   fatty liver   GERD (gastroesophageal reflux disease)    Glucose intolerance (impaired glucose tolerance)    Gout    Hyperlipidemia    Hypertension    IBS (irritable bowel syndrome)    Obstructive sleep apnea    Osteoarthritis     Past Surgical History:  Procedure Laterality Date   NO PAST SURGERIES      Prior to Admission medications   Medication Sig Start Date End Date Taking? Authorizing Provider  cetirizine (ZYRTEC) 10 MG tablet Take 10 mg by mouth daily.   Yes [provider]  citalopram (CELEXA) 20 MG tablet TAKE 1 TABLET BY MOUTH  DAILY 09/03/21  Yes Venia Carbon, MD  cyanocobalamin 1000 MCG tablet Take 1,000 mcg by mouth daily.   Yes [provider]  diltiazem (CARDIZEM CD) 120 MG 24 hr capsule TAKE 1 CAPSULE BY MOUTH  DAILY 09/03/21  Yes Venia Carbon, MD  FARXIGA 5 MG TABS tablet TAKE 1 TABLET BY MOUTH  DAILY BEFORE BREAKFAST 09/11/21  Yes Viviana Simpler I, MD  lisinopril (ZESTRIL) 20 MG tablet TAKE 1 TABLET BY MOUTH  DAILY 09/03/21   Yes Venia Carbon, MD  metFORMIN (GLUCOPHAGE-XR) 500 MG 24 hr tablet TAKE 2 TABLETS BY MOUTH  DAILY WITH BREAKFAST 09/28/21  Yes Venia Carbon, MD  Multiple Vitamin (MULTIVITAMIN WITH MINERALS) TABS tablet Take 1 tablet by mouth daily.   Yes [provider]  omeprazole (PRILOSEC) 20 MG capsule TAKE 1 CAPSULE BY MOUTH  DAILY 09/03/21  Yes Viviana Simpler I, MD  colchicine 0.6 MG tablet TAKE 1 TABLET BY MOUTH  TWICE DAILY AS NEEDED 04/03/21   Venia Carbon, MD  nitroGLYCERIN (NITRODUR - DOSED IN MG/24 HR) 0.2 mg/hr patch Apply 1/4 patch to affected area as directed by MD and change every 24 hours. 03/24/19   Copland, Frederico Hamman, MD  XARELTO 20 MG TABS tablet TAKE 1 TABLET BY MOUTH  DAILY WITH SUPPER 06/28/21   Venia Carbon, MD    Current Outpatient Medications  Medication Sig Dispense Refill   cetirizine (ZYRTEC) 10 MG tablet Take 10 mg by mouth daily.     citalopram (CELEXA) 20 MG tablet TAKE 1 TABLET BY MOUTH  DAILY 90 tablet 3   cyanocobalamin 1000 MCG tablet Take 1,000 mcg by mouth daily.     diltiazem (CARDIZEM CD) 120 MG 24 hr capsule TAKE 1 CAPSULE BY MOUTH  DAILY 90 capsule 3   FARXIGA 5 MG TABS tablet TAKE 1 TABLET BY  MOUTH  DAILY BEFORE BREAKFAST 90 tablet 3   lisinopril (ZESTRIL) 20 MG tablet TAKE 1 TABLET BY MOUTH  DAILY 90 tablet 3   metFORMIN (GLUCOPHAGE-XR) 500 MG 24 hr tablet TAKE 2 TABLETS BY MOUTH  DAILY WITH BREAKFAST 180 tablet 3   Multiple Vitamin (MULTIVITAMIN WITH MINERALS) TABS tablet Take 1 tablet by mouth daily.     omeprazole (PRILOSEC) 20 MG capsule TAKE 1 CAPSULE BY MOUTH  DAILY 90 capsule 3   colchicine 0.6 MG tablet TAKE 1 TABLET BY MOUTH  TWICE DAILY AS NEEDED 180 tablet 1   nitroGLYCERIN (NITRODUR - DOSED IN MG/24 HR) 0.2 mg/hr patch Apply 1/4 patch to affected area as directed by MD and change every 24 hours. 30 patch 2   XARELTO 20 MG TABS tablet TAKE 1 TABLET BY MOUTH  DAILY WITH SUPPER 90 tablet 3   Current Facility-Administered Medications   Medication Dose Route Frequency Provider Last Rate Last Admin   0.9 %  sodium chloride infusion  500 mL Intravenous Once Azhar Knope, Lajuan Lines, MD        Allergies as of 01/07/2022 - Review Complete 01/07/2022  Allergen Reaction Noted   Atorvastatin Other (See Comments) 07/12/2014    Family History  Problem Relation Age of Onset   Hypertension Mother    Diabetes Mother    Heart disease Father        CABG at 68   Cancer Father        lung   Colon polyps Father    Diabetes Sister    Diabetes Brother    Colon cancer Neg Hx    Stomach cancer Neg Hx    Esophageal cancer Neg Hx     Social History   Socioeconomic History   Marital status: Married    Spouse name: Not on file   Number of children: 1   Years of education: Not on file   Highest education level: Not on file  Occupational History   Occupation: Investment banker, corporate / Geologist, engineering     Comment: Retired   Occupation: Patent attorney    Comment: part time  Tobacco Use   Smoking status: Never   Smokeless tobacco: Never  Vaping Use   Vaping Use: Never used  Substance and Sexual Activity   Alcohol use: Yes    Comment: Occasionally   Drug use: No   Sexual activity: Not on file  Other Topics Concern   Not on file  Social History Narrative   Not on file   Social Determinants of Health   Financial Resource Strain: Not on file  Food Insecurity: Not on file  Transportation Needs: Not on file  Physical Activity: Not on file  Stress: Not on file  Social Connections: Not on file  Intimate Partner Violence: Not on file    Physical Exam: Vital signs in last 24 hours: '@BP'$  (!) 183/107   Pulse 75   Temp 98.6 F (37 C)   Ht '6\' 4"'$  (1.93 m)   Wt 261 lb (118.4 kg)   SpO2 99%   BMI 31.77 kg/m  GEN: NAD EYE: Sclerae anicteric ENT: MMM CV: Non-tachycardic Pulm: CTA b/l GI: Soft, NT/ND NEURO:  Alert & Oriented x 3   Zenovia Jarred, MD [facility]  01/07/2022 9:56 AM

## 2022-01-07 NOTE — Patient Instructions (Signed)
YOU HAD AN ENDOSCOPIC PROCEDURE TODAY AT [facility]:   Refer to the procedure report that was given to you for any specific questions about what was found during the examination.  If the procedure report does not answer your questions, please call your gastroenterologist to clarify.  If you requested that your care partner not be given the details of your procedure findings, then the procedure report has been included in a sealed envelope for you to review at your convenience later.  YOU SHOULD EXPECT: Some feelings of bloating in the abdomen. Passage of more gas than usual.  Walking can help get rid of the air that was put into your GI tract during the procedure and reduce the bloating. If you had a lower endoscopy (such as a colonoscopy or flexible sigmoidoscopy) you may notice spotting of blood in your stool or on the toilet paper. If you underwent a bowel prep for your procedure, you may not have a normal bowel movement for a few days.  Please Note:  You might notice some irritation and congestion in your nose or some drainage.  This is from the oxygen used during your procedure.  There is no need for concern and it should clear up in a day or so.  SYMPTOMS TO REPORT IMMEDIATELY:  Following lower endoscopy (colonoscopy or flexible sigmoidoscopy):  Excessive amounts of blood in the stool  Significant tenderness or worsening of abdominal pains  Swelling of the abdomen that is new, acute  Fever of 100F or higher  Following upper endoscopy (EGD)  Vomiting of blood or coffee ground material  New chest pain or pain under the shoulder blades  Painful or persistently difficult swallowing  New shortness of breath  Fever of 100F or higher  Black, tarry-looking stools  For urgent or emergent issues, a gastroenterologist can be reached at any hour by calling [phone]. Do not use MyChart messaging for urgent concerns.    DIET:  We do recommend a small meal at first, but  then you may proceed to your regular diet.  Drink plenty of fluids but you should avoid alcoholic beverages for 24 hours.  ACTIVITY:  You should plan to take it easy for the rest of today and you should NOT DRIVE or use heavy machinery until tomorrow (because of the sedation medicines used during the test).    FOLLOW UP: Our staff will call the number listed on your records 24-72 hours following your procedure to check on you and address any questions or concerns that you may have regarding the information given to you following your procedure. If we do not reach you, we will leave a message.  We will attempt to reach you two times.  During this call, we will ask if you have developed any symptoms of COVID 19. If you develop any symptoms (ie: fever, flu-like symptoms, shortness of breath, cough etc.) before then, please call [phone].  If you test positive for Covid 19 in the 2 weeks post procedure, please call and report this information to [place].    If any biopsies were taken you will be contacted by phone or by letter within the next 1-3 weeks.  Please call us at [phone] if you have not heard about the biopsies in 3 weeks.    SIGNATURES/CONFIDENTIALITY: You and/or your care partner have signed paperwork which will be entered into your electronic medical record.  These signatures attest to the fact that that the information above on your After  Visit Summary has been reviewed and is understood.  Full responsibility of the confidentiality of this discharge information lies with you and/or your care-partner.

## 2022-01-07 NOTE — Op Note (Signed)
[place] Patient Name: Charles Phelps Procedure Date: 01/07/2022 9:55 AM MRN: [id number] Endoscopist: Jerene Bears , MD Age: 56 Referring MD:  Date of Birth: September 25, 1965 Gender: Male Account #: [id number] Procedure:                Colonoscopy Indications:              High risk colon cancer surveillance: Personal                            history of multiple adenomas (12 at last exam),                            Last colonoscopy: November 2021 Medicines:                Monitored Anesthesia Care Procedure:                Pre-Anesthesia Assessment:                           - Prior to the procedure, a History and Physical                            was performed, and patient medications and                            allergies were reviewed. The patient's tolerance of                            previous anesthesia was also reviewed. The risks                            and benefits of the procedure and the sedation                            options and risks were discussed with the patient.                            All questions were answered, and informed consent                            was obtained. Prior Anticoagulants: The patient has                            taken Xarelto (rivaroxaban), last dose was 2 days                            prior to procedure. ASA Grade Assessment: III - A                            patient with severe systemic disease. After                            reviewing the risks and benefits, the patient was  deemed in satisfactory condition to undergo the                            procedure.                           After obtaining informed consent, the colonoscope                            was passed under direct vision. Throughout the                            procedure, the patient's blood pressure, pulse, and                            oxygen saturations were monitored continuously. The                             Olympus CF-[id number]) Colonoscope was                            introduced through the anus and advanced to the                            cecum, identified by appendiceal orifice and                            ileocecal valve. The colonoscopy was performed                            without difficulty. The patient tolerated the                            procedure well. The quality of the bowel                            preparation was good. The ileocecal valve,                            appendiceal orifice, and rectum were photographed. Scope In: 10:08:09 AM Scope Out: 10:25:14 AM Scope Withdrawal Time: 0 hours 12 minutes 31 seconds  Total Procedure Duration: 0 hours 17 minutes 5 seconds  Findings:                 The digital rectal exam was normal.                           Two sessile polyps were found in the cecum. The                            polyps were 3 to 4 mm in size. These polyps were                            removed with a cold snare. Resection and retrieval  were complete.                           A 3 mm polyp was found in the ascending colon. The                            polyp was sessile. The polyp was removed with a                            cold snare. Resection and retrieval were complete.                           A 4 mm polyp was found in the descending colon. The                            polyp was sessile. The polyp was removed with a                            cold snare. Resection and retrieval were complete.                           A 5 mm polyp was found in the sigmoid colon. The                            polyp was sessile. The polyp was removed with a                            cold snare. Resection and retrieval were complete.                           A few small-mouthed diverticula were found in the                            sigmoid colon.                           The retroflexed view of the distal  rectum and anal                            verge was normal and showed no anal or rectal                            abnormalities. Complications:            No immediate complications. Estimated Blood Loss:     Estimated blood loss was minimal. Impression:               - Two 3 to 4 mm polyps in the cecum, removed with a                            cold snare. Resected and retrieved.                           -  One 3 mm polyp in the ascending colon, removed                            with a cold snare. Resected and retrieved.                           - One 4 mm polyp in the descending colon, removed                            with a cold snare. Resected and retrieved.                           - One 5 mm polyp in the sigmoid colon, removed with                            a cold snare. Resected and retrieved.                           - Mild diverticulosis in the sigmoid colon.                           - The distal rectum and anal verge are normal on                            retroflexion view. Recommendation:           - Patient has a contact number available for                            emergencies. The signs and symptoms of potential                            delayed complications were discussed with the                            patient. Return to normal activities tomorrow.                            Written discharge instructions were provided to the                            patient.                           - Resume previous diet.                           - Continue present medications.                           - Resume Xarelto (rivaroxaban) at prior dose                            tomorrow. Refer to managing physician for further  adjustment of therapy.                           - Await pathology results.                           - Repeat colonoscopy is recommended for                            surveillance. The colonoscopy date will be                             determined after pathology results from today's                            exam become available for review. Jerene Bears, MD 01/07/2022 10:30:20 AM This report has been signed electronically.

## 2022-01-08 ENCOUNTER — Encounter: Payer: Self-pay | Admitting: Adult Health

## 2022-01-08 ENCOUNTER — Telehealth: Payer: Self-pay

## 2022-01-08 ENCOUNTER — Ambulatory Visit: Payer: BC Managed Care – PPO | Admitting: Adult Health

## 2022-01-08 VITALS — BP 130/90 | HR 78 | Temp 97.7°F | Ht 75.0 in | Wt 261.2 lb

## 2022-01-08 DIAGNOSIS — G4733 Obstructive sleep apnea (adult) (pediatric): Secondary | ICD-10-CM

## 2022-01-08 NOTE — Patient Instructions (Signed)
Continue on CPAP At bedtime Keep up good work .  Work on healthy weight .  Do not drive if sleepy.  Follow up with Dr. Elsworth Soho or Tristina Sahagian NP  In 1 year and As needed   Please contact office for sooner follow up if symptoms do not improve or worsen or seek emergency care

## 2022-01-08 NOTE — Progress Notes (Signed)
$'@Patient'c$  ID: Charles Phelps, male    DOB: 06-22-1966, 56 y.o.   MRN: [id number]  Chief Complaint  Patient presents with   Follow-up    Referring provider: Venia Carbon, MD  HPI: 56 year old male followed for severe obstructive sleep apnea on nocturnal CPAP  TEST/EVENTS :  NPSG 2005:  AHI 86/hr.  01/08/2022 Follow up: Obstructive sleep apnea Patient presents for a follow-up visit.  Patient has underlying severe obstructive sleep apnea.  He remains on CPAP at bedtime.  Patient says he wears his CPAP every single night.  Cannot sleep without it.  Patient says he uses his CPAP at least 8 hours or more at night.  CPAP download shows 100% compliance.  Daily average usage at 9 hours.  Patient is on auto CPAP 5 to 13 cm H2O.  AHI 1.4.  Minimal leaks.  Needs rx for [email or web address] online to order  Uses nasal mask.  Does not want to ever use [employer].     Allergies  Allergen Reactions   Atorvastatin Other (See Comments)    Memory problems    Immunization History  Administered Date(s) Administered   Pneumococcal Conjugate-13 01/30/2017   Td 07/30/1998, 10/27/2009, 09/14/2019    Past Medical History:  Diagnosis Date   Allergic rhinitis    Anxiety    Arthritis    Atrial fibrillation (HCC)    Diabetes ([place])    Elevated LFTs 2000   fatty liver   GERD (gastroesophageal reflux disease)    Glucose intolerance (impaired glucose tolerance)    Gout    Hyperlipidemia    Hypertension    IBS (irritable bowel syndrome)    Obstructive sleep apnea    Osteoarthritis     Tobacco History: Social History   Tobacco Use  Smoking Status Never  Smokeless Tobacco Never   Counseling given: Not Answered   Outpatient Medications Prior to Visit  Medication Sig Dispense Refill   cetirizine (ZYRTEC) 10 MG tablet Take 10 mg by mouth daily.     citalopram (CELEXA) 20 MG tablet TAKE 1 TABLET BY MOUTH  DAILY 90 tablet 3   colchicine 0.6 MG tablet TAKE 1 TABLET BY MOUTH  TWICE DAILY AS  NEEDED 180 tablet 1   cyanocobalamin 1000 MCG tablet Take 1,000 mcg by mouth daily.     diltiazem (CARDIZEM CD) 120 MG 24 hr capsule TAKE 1 CAPSULE BY MOUTH  DAILY 90 capsule 3   FARXIGA 5 MG TABS tablet TAKE 1 TABLET BY MOUTH  DAILY BEFORE BREAKFAST 90 tablet 3   lisinopril (ZESTRIL) 20 MG tablet TAKE 1 TABLET BY MOUTH  DAILY 90 tablet 3   metFORMIN (GLUCOPHAGE-XR) 500 MG 24 hr tablet TAKE 2 TABLETS BY MOUTH  DAILY WITH BREAKFAST 180 tablet 3   Multiple Vitamin (MULTIVITAMIN WITH MINERALS) TABS tablet Take 1 tablet by mouth daily.     nitroGLYCERIN (NITRODUR - DOSED IN MG/24 HR) 0.2 mg/hr patch Apply 1/4 patch to affected area as directed by MD and change every 24 hours. 30 patch 2   omeprazole (PRILOSEC) 20 MG capsule TAKE 1 CAPSULE BY MOUTH  DAILY 90 capsule 3   XARELTO 20 MG TABS tablet TAKE 1 TABLET BY MOUTH  DAILY WITH SUPPER 90 tablet 3   No facility-administered medications prior to visit.     Review of Systems:   Constitutional:   No  weight loss, night sweats,  Fevers, chills, fatigue, or  lassitude.  HEENT:   No headaches,  Difficulty swallowing,  Tooth/dental  problems, or  Sore throat,                No sneezing, itching, ear ache, nasal congestion, post nasal drip,   CV:  No chest pain,  Orthopnea, PND, swelling in lower extremities, anasarca, dizziness, palpitations, syncope.   GI  No heartburn, indigestion, abdominal pain, nausea, vomiting, diarrhea, change in bowel habits, loss of appetite, bloody stools.   Resp: No shortness of breath with exertion or at rest.  No excess mucus, no productive cough,  No non-productive cough,  No coughing up of blood.  No change in color of mucus.  No wheezing.  No chest wall deformity  Skin: no rash or lesions.  GU: no dysuria, change in color of urine, no urgency or frequency.  No flank pain, no hematuria   MS:  No joint pain or swelling.  No decreased range of motion.  No back pain.    Physical Exam  BP 130/90 (BP Location:  Left Arm, Patient Position: Sitting, Cuff Size: Normal)   Pulse 78   Temp 97.7 F (36.5 C) (Oral)   Ht '6\' 3"'$  (1.905 m)   Wt 261 lb 3.2 oz (118.5 kg)   SpO2 99%   BMI 32.65 kg/m   GEN: A/Ox3; pleasant , NAD, well nourished    HEENT:  [place]/AT,  NOSE-clear, THROAT-clear, no lesions, no postnasal drip or exudate noted. Class 3 MP airway   NECK:  Supple w/ fair ROM; no JVD; normal carotid impulses w/o bruits; no thyromegaly or nodules palpated; no lymphadenopathy.    RESP  Clear  P & A; w/o, wheezes/ rales/ or rhonchi. no accessory muscle use, no dullness to percussion  CARD:  RRR, no m/r/g, no peripheral edema, pulses intact, no cyanosis or clubbing.  GI:   Soft & nt; nml bowel sounds; no organomegaly or masses detected.   Musco: Warm bil, no deformities or joint swelling noted.   Neuro: alert, no focal deficits noted.    Skin: Warm, no lesions or rashes    Lab Results:  CBC   BNP No results found for: "BNP"  ProBNP No results found for: "PROBNP"  Imaging: No results found.  cyanocobalamin ((VITAMIN B-12)) injection 1,000 mcg     Date Action Dose Route User   11/15/2021 [phone] Given 1,000 mcg Intramuscular (Left Deltoid) Goins, Felicia, CMA      cyanocobalamin ((VITAMIN B-12)) injection 1,000 mcg     Date Action Dose Route User   12/20/2021 [phone] Given 1,000 mcg Intramuscular (Right Deltoid) Good, Mykal L, CMA           No data to display          No results found for: "NITRICOXIDE"      Assessment & Plan:   OSA (obstructive sleep apnea) Severe obstructive sleep apnea with excellent control compliance on nocturnal CPAP  Plan  Patient Instructions  Continue on CPAP At bedtime Keep up good work .  Work on healthy weight .  Do not drive if sleepy.  Follow up with Dr. Elsworth Soho or Lilyian Quayle NP  In 1 year and As needed   Please contact office for sooner follow up if symptoms do not improve or worsen or seek emergency care         Rexene Edison,  NP 01/08/2022

## 2022-01-08 NOTE — Assessment & Plan Note (Signed)
Severe obstructive sleep apnea with excellent control compliance on nocturnal CPAP  Plan  Patient Instructions  Continue on CPAP At bedtime Keep up good work .  Work on healthy weight .  Do not drive if sleepy.  Follow up with Dr. Elsworth Soho or Sholanda Croson NP  In 1 year and As needed   Please contact office for sooner follow up if symptoms do not improve or worsen or seek emergency care

## 2022-01-08 NOTE — Telephone Encounter (Signed)
  Follow up Call-     01/07/2022    9:12 AM 06/14/2020    1:25 PM  Call back number  Post procedure Call Back phone  # [phone]  Permission to leave phone message Yes Yes     Patient questions:  Do you have a fever, pain , or abdominal swelling? No. Pain Score  0 *  Have you tolerated food without any problems? Yes.    Have you been able to return to your normal activities? Yes.    Do you have any questions about your discharge instructions: Diet   No. Medications  No. Follow up visit  No.  Do you have questions or concerns about your Care? No.  Actions: * If pain score is 4 or above: No action needed, pain <4.

## 2022-01-09 ENCOUNTER — Encounter: Payer: Self-pay | Admitting: Internal Medicine

## 2022-01-22 ENCOUNTER — Ambulatory Visit (INDEPENDENT_AMBULATORY_CARE_PROVIDER_SITE_OTHER): Payer: BC Managed Care – PPO | Admitting: *Deleted

## 2022-01-22 DIAGNOSIS — E538 Deficiency of other specified B group vitamins: Secondary | ICD-10-CM | POA: Diagnosis not present

## 2022-01-22 MED ORDER — CYANOCOBALAMIN 1000 MCG/ML IJ SOLN
1000.0000 ug | Freq: Once | INTRAMUSCULAR | Status: AC
  Administered 2022-01-22: 1000 ug via INTRAMUSCULAR

## 2022-01-22 NOTE — Progress Notes (Signed)
Per orders of vitamin B 12, injection of Eugenia Pancoast FNP given by Emelia Salisbury. Patient tolerated injection well.

## 2022-02-13 DIAGNOSIS — Z7984 Long term (current) use of oral hypoglycemic drugs: Secondary | ICD-10-CM | POA: Diagnosis not present

## 2022-02-13 LAB — HM DIABETES EYE EXAM

## 2022-03-20 DIAGNOSIS — E113393 Type 2 diabetes mellitus with moderate nonproliferative diabetic retinopathy without macular edema, bilateral: Secondary | ICD-10-CM | POA: Diagnosis not present

## 2022-05-22 ENCOUNTER — Other Ambulatory Visit: Payer: Self-pay | Admitting: Internal Medicine

## 2022-05-28 ENCOUNTER — Encounter: Payer: Self-pay | Admitting: Internal Medicine

## 2022-05-28 ENCOUNTER — Ambulatory Visit: Payer: BC Managed Care – PPO | Admitting: Internal Medicine

## 2022-05-28 VITALS — BP 120/62 | HR 70 | Temp 97.7°F | Ht 75.0 in | Wt 262.0 lb

## 2022-05-28 DIAGNOSIS — E1159 Type 2 diabetes mellitus with other circulatory complications: Secondary | ICD-10-CM | POA: Diagnosis not present

## 2022-05-28 DIAGNOSIS — I10 Essential (primary) hypertension: Secondary | ICD-10-CM

## 2022-05-28 LAB — POCT GLYCOSYLATED HEMOGLOBIN (HGB A1C): Hemoglobin A1C: 7.4 % — AB (ref 4.0–5.6)

## 2022-05-28 MED ORDER — VALACYCLOVIR HCL 1 G PO TABS: 2000.0000 mg | ORAL_TABLET | Freq: Two times a day (BID) | ORAL | 1 refills | Status: DC

## 2022-05-28 MED ORDER — DAPAGLIFLOZIN PROPANEDIOL 10 MG PO TABS
10.0000 mg | ORAL_TABLET | Freq: Every day | ORAL | 3 refills | Status: DC
Start: 2022-05-28 — End: 2023-04-09

## 2022-05-28 NOTE — Progress Notes (Signed)
Subjective:    Patient ID: Charles Phelps, male    DOB: 1966/02/09, 56 y.o.   MRN: [id number]  HPI Here for diabetes follow up  Hurt his hip Chronic problems Then it popped playing golf Going to see Dr Ninfa Linden  Not checking sugars Doesn't like pricking his fingers  No chest pain No SOB  Current Outpatient Medications on File Prior to Visit  Medication Sig Dispense Refill   cetirizine (ZYRTEC) 10 MG tablet Take 10 mg by mouth daily.     citalopram (CELEXA) 20 MG tablet TAKE 1 TABLET BY MOUTH  DAILY 90 tablet 3   colchicine 0.6 MG tablet TAKE 1 TABLET BY MOUTH  TWICE DAILY AS NEEDED 180 tablet 1   cyanocobalamin 1000 MCG tablet Take 1,000 mcg by mouth daily.     diltiazem (CARDIZEM CD) 120 MG 24 hr capsule TAKE 1 CAPSULE BY MOUTH  DAILY 90 capsule 3   FARXIGA 5 MG TABS tablet TAKE 1 TABLET BY MOUTH  DAILY BEFORE BREAKFAST 90 tablet 3   lisinopril (ZESTRIL) 20 MG tablet TAKE 1 TABLET BY MOUTH  DAILY 90 tablet 3   metFORMIN (GLUCOPHAGE-XR) 500 MG 24 hr tablet TAKE 2 TABLETS BY MOUTH  DAILY WITH BREAKFAST 180 tablet 3   Multiple Vitamin (MULTIVITAMIN WITH MINERALS) TABS tablet Take 1 tablet by mouth daily.     omeprazole (PRILOSEC) 20 MG capsule TAKE 1 CAPSULE BY MOUTH  DAILY 90 capsule 3   XARELTO 20 MG TABS tablet TAKE 1 TABLET BY MOUTH DAILY  WITH SUPPER 90 tablet 3   nitroGLYCERIN (NITRODUR - DOSED IN MG/24 HR) 0.2 mg/hr patch Apply 1/4 patch to affected area as directed by MD and change every 24 hours. (Patient not taking: Reported on 05/28/2022) 30 patch 2   No current facility-administered medications on file prior to visit.    Allergies  Allergen Reactions   Atorvastatin Other (See Comments)    Memory problems    Past Medical History:  Diagnosis Date   Allergic rhinitis    Anxiety    Arthritis    Atrial fibrillation (HCC)    Diabetes (HCC)    Elevated LFTs 2000   fatty liver   GERD (gastroesophageal reflux disease)    Glucose intolerance (impaired glucose  tolerance)    Gout    Hyperlipidemia    Hypertension    IBS (irritable bowel syndrome)    Obstructive sleep apnea    Osteoarthritis     Past Surgical History:  Procedure Laterality Date   NO PAST SURGERIES      Family History  Problem Relation Age of Onset   Hypertension Mother    Diabetes Mother    Heart disease Father        CABG at 55   Cancer Father        lung   Colon polyps Father    Diabetes Sister    Diabetes Brother    Colon cancer Neg Hx    Stomach cancer Neg Hx    Esophageal cancer Neg Hx     Social History   Socioeconomic History   Marital status: Married    Spouse name: Not on file   Number of children: 1   Years of education: Not on file   Highest education level: Not on file  Occupational History   Occupation: Investment banker, corporate / Geologist, engineering     Comment: Retired   Occupation: Patent attorney    Comment: part time  Tobacco Use   Smoking  status: Never   Smokeless tobacco: Never  Vaping Use   Vaping Use: Never used  Substance and Sexual Activity   Alcohol use: Yes    Comment: Occasionally   Drug use: No   Sexual activity: Not on file  Other Topics Concern   Not on file  Social History Narrative   Not on file   Social Determinants of Health   Financial Resource Strain: Not on file  Food Insecurity: Not on file  Transportation Needs: Not on file  Physical Activity: Not on file  Stress: Not on file  Social Connections: Not on file  Intimate Partner Violence: Not on file   Review of Systems Tries to be careful eating---weight stable Sleeps okay     Objective:   Physical Exam Constitutional:      Appearance: Normal appearance.  Cardiovascular:     Rate and Rhythm: Normal rate and regular rhythm.     Pulses: Normal pulses.     Heart sounds: No murmur heard.    No gallop.  Pulmonary:     Effort: Pulmonary effort is normal.     Breath sounds: Normal breath sounds. No wheezing or rales.  Musculoskeletal:     Cervical back: Neck  supple.     Right lower leg: No edema.     Left lower leg: No edema.  Lymphadenopathy:     Cervical: No cervical adenopathy.  Skin:    Comments: No foot lesions  Neurological:     Mental Status: He is alert.            Assessment & Plan:

## 2022-05-28 NOTE — Assessment & Plan Note (Signed)
BP Readings from Last 3 Encounters:  05/28/22 120/62  01/08/22 130/90  01/07/22 (!) 154/86   Controlled on lisinopril 20, diltiazem 120 daily

## 2022-05-28 NOTE — Assessment & Plan Note (Signed)
Lab Results  Component Value Date   HGBA1C 7.4 (A) 05/28/2022   Up slightly Will increase the farxiga to '10mg'$  daily Continue metformin 1000 daily Needs to check sugars some

## 2022-06-10 ENCOUNTER — Ambulatory Visit (INDEPENDENT_AMBULATORY_CARE_PROVIDER_SITE_OTHER): Payer: BC Managed Care – PPO

## 2022-06-10 ENCOUNTER — Other Ambulatory Visit: Payer: Self-pay

## 2022-06-10 ENCOUNTER — Ambulatory Visit: Payer: BC Managed Care – PPO | Admitting: Orthopaedic Surgery

## 2022-06-10 DIAGNOSIS — M25552 Pain in left hip: Secondary | ICD-10-CM

## 2022-06-10 NOTE — Progress Notes (Signed)
The patient is a 56 year old gentleman who comes in with several years of worsening left hip pain.  It is now starting to hurt in the groin.  It used to hurt just on the lateral aspect of his hip.  He says it is getting worse with golf and walking and being on his feet all day long.  He does state it weather changes are short and effective.  He is on Xarelto for history of A-fib.  He is also diabetic.  His hemoglobin A1c 13 days ago was 7.4.  He denies any chest pain, shortness of breath, fever, chills, nausea, vomiting.  On exam he does walk with a slight limp.  He has significant pain in the groin with internal and external rotation of the left hip and pain over the lateral aspect of his left hip to palpation.  An AP pelvis and lateral left hip does show arthritic changes of the superior lateral aspect of the left hip with slight flattening of the femoral head and other arthritic changes of the acetabulum.  I would like to send him for an MRI of his left hip to fully assess the cartilage and labrum.  I would not recommend steroid injection today given his hemoglobin A1c is 7.4.  Would not want this to elevate his blood glucose.  He agrees with the treatment plan.  We will see him back once we have MRI of his left hip and can go from there in terms of treatment recommendations and plan.

## 2022-06-25 ENCOUNTER — Ambulatory Visit: Payer: BC Managed Care – PPO | Admitting: Internal Medicine

## 2022-07-02 ENCOUNTER — Ambulatory Visit
Admission: RE | Admit: 2022-07-02 | Discharge: 2022-07-02 | Disposition: A | Discharge status: Home / Self Care | Payer: BC Managed Care – PPO | Source: Ambulatory Visit | Attending: Orthopaedic Surgery | Admitting: Orthopaedic Surgery

## 2022-07-02 DIAGNOSIS — M1612 Unilateral primary osteoarthritis, left hip: Secondary | ICD-10-CM | POA: Diagnosis not present

## 2022-07-02 DIAGNOSIS — S73192A Other sprain of left hip, initial encounter: Secondary | ICD-10-CM | POA: Diagnosis not present

## 2022-07-02 DIAGNOSIS — M533 Sacrococcygeal disorders, not elsewhere classified: Secondary | ICD-10-CM | POA: Diagnosis not present

## 2022-07-02 DIAGNOSIS — M25552 Pain in left hip: Secondary | ICD-10-CM

## 2022-07-04 ENCOUNTER — Encounter: Payer: Self-pay | Admitting: Orthopaedic Surgery

## 2022-07-04 ENCOUNTER — Ambulatory Visit: Payer: BC Managed Care – PPO | Admitting: Orthopaedic Surgery

## 2022-07-04 DIAGNOSIS — M1612 Unilateral primary osteoarthritis, left hip: Secondary | ICD-10-CM | POA: Diagnosis not present

## 2022-07-04 DIAGNOSIS — M25552 Pain in left hip: Secondary | ICD-10-CM | POA: Diagnosis not present

## 2022-07-04 MED ORDER — TRAMADOL HCL 50 MG PO TABS: 100.0000 mg | ORAL_TABLET | Freq: Three times a day (TID) | ORAL | 0 refills | Status: DC | PRN

## 2022-07-04 NOTE — Progress Notes (Signed)
Charles Phelps comes in today to go over an MRI of his left hip.  The MRI does show moderate arthritis with cartilage wear of the superior weightbearing surface of the femoral head.  There is also subchondral edema.  There is degenerative labral tearing as well.  He is walking with a limp.  Exam he does have pain in the groin with internal and external rotation of the left hip.  He started to have knee pain he is trying some copper fit knee sleeves for his knees.  He is retired but he does work 1 day a week at a [employer].  He is diabetic as well.  At this point I showed him a hip replacement model.  We talked about the possibility of surgery at some point.  I would like to at least set him up for an intra-articular steroid injection in his left hip joint under radiographic guidance.  Although he is diabetic I counseled him about watching his blood glucose.  Neither of my partners is around right now who does the size of injection so we will set him up for that and give him a call.  Once they inject his hip I want to see him about 3 to 4 weeks after that injection.  He agrees with this.

## 2022-07-09 ENCOUNTER — Ambulatory Visit: Payer: Self-pay

## 2022-07-09 ENCOUNTER — Encounter: Payer: Self-pay | Admitting: Sports Medicine

## 2022-07-09 ENCOUNTER — Ambulatory Visit: Payer: BC Managed Care – PPO | Admitting: Sports Medicine

## 2022-07-09 DIAGNOSIS — M25552 Pain in left hip: Secondary | ICD-10-CM | POA: Diagnosis not present

## 2022-07-09 DIAGNOSIS — M1612 Unilateral primary osteoarthritis, left hip: Secondary | ICD-10-CM

## 2022-07-09 MED ORDER — METHYLPREDNISOLONE ACETATE 40 MG/ML IJ SUSP
40.0000 mg | INTRAMUSCULAR | Status: AC | PRN
  Administered 2022-07-09: 40 mg via INTRA_ARTICULAR

## 2022-07-09 MED ORDER — LIDOCAINE HCL 1 % IJ SOLN
4.0000 mL | INTRAMUSCULAR | Status: AC | PRN
  Administered 2022-07-09: 4 mL

## 2022-07-09 NOTE — Progress Notes (Signed)
   Procedure Note  Patient: Charles Phelps             Date of Birth: May 01, 1966           MRN: [id number]             Visit Date: 07/09/2022  Procedures: Visit Diagnoses:  1. Pain of left hip   2. Unilateral primary osteoarthritis, left hip    Large Joint Inj: L hip joint on 07/09/2022 11:15 AM Indications: pain Details: 22 G 3.5 in needle, ultrasound-guided anterior approach Medications: 4 mL lidocaine 1 %; 40 mg methylPREDNISolone acetate 40 MG/ML Outcome: tolerated well, no immediate complications  Procedure: US-guided intra-articular hip injection, left After discussion on risks/benefits/indications and informed verbal consent was obtained, a timeout was performed. Patient was lying supine on exam table. The hip was cleaned with betadine and alcohol swabs. Then utilizing ultrasound guidance, the patient's femoral head and neck junction was identified and subsequently injected with 4:1 lidocaine:depomedrol via an in-plane approach with ultrasound visualization of the injectate administered into the hip joint. Patient tolerated procedure well without immediate complications.  Procedure, treatment alternatives, risks and benefits explained, specific risks discussed. Consent was given by the patient. Immediately prior to procedure a time out was called to verify the correct patient, procedure, equipment, support staff and site/side marked as required. Patient was prepped and draped in the usual sterile fashion.     - I evaluated the patient about 10 minutes post-injection and he had rather significant improvement in pain and range of motion - follow-up with Dr. Ninfa Linden as indicated; I am happy to see them as needed  Elba Barman, DO [place]  This note was dictated using Dragon naturally speaking software and may contain errors in syntax, spelling, or content which have not been identified prior to signing this  note.

## 2022-07-09 NOTE — Progress Notes (Signed)
[facility] patient here for left hip pain; injection

## 2022-07-29 ENCOUNTER — Other Ambulatory Visit: Payer: Self-pay | Admitting: Internal Medicine

## 2022-08-20 ENCOUNTER — Ambulatory Visit: Payer: BC Managed Care – PPO | Admitting: Orthopaedic Surgery

## 2022-08-20 ENCOUNTER — Encounter: Payer: Self-pay | Admitting: Orthopaedic Surgery

## 2022-08-20 DIAGNOSIS — M1612 Unilateral primary osteoarthritis, left hip: Secondary | ICD-10-CM | POA: Diagnosis not present

## 2022-08-20 DIAGNOSIS — M1712 Unilateral primary osteoarthritis, left knee: Secondary | ICD-10-CM

## 2022-08-20 NOTE — Progress Notes (Signed)
HPI: Charles Phelps returns today status post left hip injection 07/09/2022 with Dr. Rolena Infante.  He has moderate arthritis involving the superior weightbearing surface of the femoral head per MRI.  He notes that the injections helped greatly.  Rates his pain to be now 2-3 out of 10 pain at worst.  States he is able to hit golf balls inside without hurting.  He has some soreness the day after.  Occasionally takes some Tylenol for the pain.  Also has some left knee pain with mild arthritic changes by radiograph.  He is asking about possible injections.  He would like to stay away for cortisone is much as possible as he is diabetic.  He is also on Xarelto and unable to take NSAIDs.  Review of systems: See HPI otherwise negative  Physical exam: General well-developed well-nourished male no acute distress ambulates without any assistive device. Psych: Alert and oriented x 3 Left hip: Virtually no internal rotation.  Good external rotation without pain.  Impression: Left hip arthritis Left knee arthritis  Plan: In regards to his hip pain he can have injections in the hip.  MRI every 6 months.  As far as his knee recommend the form quad strengthening exercises which were shown.  Avoid deep squats lunges since he does have some mild patellofemoral arthritic changes.  For both the hip and the knee he will follow-up with [place] as needed.  He may be candidate for supplemental injection of the left knee in the future..  Questions were encouraged and answered at length

## 2022-08-20 NOTE — Progress Notes (Signed)
I agree with Benita Stabile, PA-C's assessment and plan had a known this patient with his left hip arthritis.  The injection did help some.  We have recommended hip replacement surgery and he is going to think about it further.

## 2022-08-28 ENCOUNTER — Other Ambulatory Visit: Payer: Self-pay | Admitting: Internal Medicine

## 2022-10-21 DIAGNOSIS — L821 Other seborrheic keratosis: Secondary | ICD-10-CM | POA: Diagnosis not present

## 2022-10-21 DIAGNOSIS — D225 Melanocytic nevi of trunk: Secondary | ICD-10-CM | POA: Diagnosis not present

## 2022-10-21 DIAGNOSIS — L814 Other melanin hyperpigmentation: Secondary | ICD-10-CM | POA: Diagnosis not present

## 2022-10-21 DIAGNOSIS — L570 Actinic keratosis: Secondary | ICD-10-CM | POA: Diagnosis not present

## 2022-10-21 DIAGNOSIS — L578 Other skin changes due to chronic exposure to nonionizing radiation: Secondary | ICD-10-CM | POA: Diagnosis not present

## 2022-12-16 ENCOUNTER — Encounter: Payer: BC Managed Care – PPO | Admitting: Internal Medicine

## 2022-12-19 ENCOUNTER — Encounter: Payer: Self-pay | Admitting: Internal Medicine

## 2022-12-19 ENCOUNTER — Ambulatory Visit (INDEPENDENT_AMBULATORY_CARE_PROVIDER_SITE_OTHER): Payer: BC Managed Care – PPO | Admitting: Internal Medicine

## 2022-12-19 VITALS — BP 120/80 | HR 52 | Temp 97.3°F | Ht 75.0 in | Wt 261.0 lb

## 2022-12-19 DIAGNOSIS — I480 Paroxysmal atrial fibrillation: Secondary | ICD-10-CM | POA: Diagnosis not present

## 2022-12-19 DIAGNOSIS — Z7984 Long term (current) use of oral hypoglycemic drugs: Secondary | ICD-10-CM

## 2022-12-19 DIAGNOSIS — K580 Irritable bowel syndrome with diarrhea: Secondary | ICD-10-CM

## 2022-12-19 DIAGNOSIS — E1159 Type 2 diabetes mellitus with other circulatory complications: Secondary | ICD-10-CM

## 2022-12-19 DIAGNOSIS — F39 Unspecified mood [affective] disorder: Secondary | ICD-10-CM

## 2022-12-19 DIAGNOSIS — Z0000 Encounter for general adult medical examination without abnormal findings: Secondary | ICD-10-CM | POA: Diagnosis not present

## 2022-12-19 DIAGNOSIS — Z125 Encounter for screening for malignant neoplasm of prostate: Secondary | ICD-10-CM

## 2022-12-19 DIAGNOSIS — G4733 Obstructive sleep apnea (adult) (pediatric): Secondary | ICD-10-CM

## 2022-12-19 LAB — MICROALBUMIN / CREATININE URINE RATIO
Creatinine,U: 283.5 mg/dL
Microalb Creat Ratio: 7.7 mg/g (ref 0.0–30.0)
Microalb, Ur: 21.9 mg/dL — ABNORMAL HIGH (ref 0.0–1.9)

## 2022-12-19 LAB — CBC
HCT: 45.4 % (ref 39.0–52.0)
Hemoglobin: 15.4 g/dL (ref 13.0–17.0)
MCHC: 33.9 g/dL (ref 30.0–36.0)
MCV: 84.7 fl (ref 78.0–100.0)
Platelets: 179 10*3/uL (ref 150.0–400.0)
RBC: 5.36 Mil/uL (ref 4.22–5.81)
RDW: 14.2 % (ref 11.5–15.5)
WBC: 6.8 10*3/uL (ref 4.0–10.5)

## 2022-12-19 LAB — RENAL FUNCTION PANEL
Albumin: 4.5 g/dL (ref 3.5–5.2)
BUN: 21 mg/dL (ref 6–23)
CO2: 28 mEq/L (ref 19–32)
Calcium: 9.8 mg/dL (ref 8.4–10.5)
Chloride: 101 mEq/L (ref 96–112)
Creatinine, Ser: 1.42 mg/dL (ref 0.40–1.50)
GFR: 54.43 mL/min — ABNORMAL LOW (ref >60.00)
Glucose, Bld: 187 mg/dL — ABNORMAL HIGH (ref 70–99)
Phosphorus: 3.1 mg/dL (ref 2.3–4.6)
Potassium: 4.2 mEq/L (ref 3.5–5.1)
Sodium: 139 mEq/L (ref 135–145)

## 2022-12-19 LAB — PSA: PSA: 0.89 ng/mL (ref 0.10–4.00)

## 2022-12-19 LAB — HM DIABETES FOOT EXAM

## 2022-12-19 LAB — HEPATIC FUNCTION PANEL
ALT: 42 U/L (ref 0–53)
AST: 32 U/L (ref 0–37)
Albumin: 4.5 g/dL (ref 3.5–5.2)
Alkaline Phosphatase: 74 U/L (ref 39–117)
Bilirubin, Direct: 0.2 mg/dL (ref 0.0–0.3)
Total Bilirubin: 1 mg/dL (ref 0.2–1.2)
Total Protein: 7.2 g/dL (ref 6.0–8.3)

## 2022-12-19 LAB — LIPID PANEL
Cholesterol: 214 mg/dL — ABNORMAL HIGH (ref 0–200)
HDL: 42.4 mg/dL (ref >39.00)
NonHDL: 171.37
Total CHOL/HDL Ratio: 5
Triglycerides: 330 mg/dL — ABNORMAL HIGH (ref 0.0–149.0)
VLDL: 66 mg/dL — ABNORMAL HIGH (ref 0.0–40.0)

## 2022-12-19 LAB — HEMOGLOBIN A1C: Hgb A1c MFr Bld: 7.5 % — ABNORMAL HIGH (ref 4.6–6.5)

## 2022-12-19 LAB — LDL CHOLESTEROL, DIRECT: Direct LDL: 131 mg/dL

## 2022-12-19 MED ORDER — ONETOUCH VERIO W/DEVICE KIT: 1.0000 | PACK | Freq: Once | 0 refills | Status: AC

## 2022-12-19 MED ORDER — ONETOUCH VERIO VI LIQD: 1.0000 | Freq: Once | 2 refills | Status: AC

## 2022-12-19 MED ORDER — HYOSCYAMINE SULFATE 0.125 MG PO TABS: 0.1250 mg | ORAL_TABLET | Freq: Three times a day (TID) | ORAL | 1 refills | Status: AC | PRN

## 2022-12-19 MED ORDER — ONETOUCH VERIO VI STRP: ORAL_STRIP | 11 refills | Status: AC

## 2022-12-19 MED ORDER — ONETOUCH DELICA LANCETS 33G MISC: 11 refills | Status: AC

## 2022-12-19 MED ORDER — ONETOUCH DELICA LANCING DEV MISC: 1.0000 | Freq: Once | 0 refills | Status: AC

## 2022-12-19 NOTE — Assessment & Plan Note (Signed)
Discussed lactase tabs Will try hyoscyamine for prn

## 2022-12-19 NOTE — Assessment & Plan Note (Signed)
Sleeps nightly with CPAP 

## 2022-12-19 NOTE — Addendum Note (Signed)
Addended by: Eual Fines on: 12/19/2022 12:00 PM   Modules accepted: Orders

## 2022-12-19 NOTE — Assessment & Plan Note (Signed)
Healthy Discussed fitness Next colon due 2028 Will check PSA Prefers no vaccines

## 2022-12-19 NOTE — Assessment & Plan Note (Signed)
Hopefully still reasonable control On farxiga 10, metfomrin 1000mg  daily

## 2022-12-19 NOTE — Progress Notes (Signed)
Subjective:    Patient ID: Charles Phelps, male    DOB: 12-Jun-1966, 57 y.o.   MRN: [id number]  HPI Here for physical  Still grieving brother's death in 07-Aug-2023 Has been tough Tends to think of it coming into the doctor's office  Not really checking sugar--strips were outdated No foot numbness, tingling or burning Tries to be careful with eating  Does have some fecal urgency at times---?from butter. Can be aggravating Discussed lactose deficiency  Is taking B12  Last GFR 53  Current Outpatient Medications on File Prior to Visit  Medication Sig Dispense Refill   cetirizine (ZYRTEC) 10 MG tablet Take 10 mg by mouth daily.     citalopram (CELEXA) 20 MG tablet TAKE 1 TABLET BY MOUTH DAILY 90 tablet 3   colchicine 0.6 MG tablet TAKE 1 TABLET BY MOUTH  TWICE DAILY AS NEEDED 180 tablet 1   cyanocobalamin 1000 MCG tablet Take 1,000 mcg by mouth daily.     dapagliflozin propanediol (FARXIGA) 10 MG TABS tablet Take 1 tablet (10 mg total) by mouth daily before breakfast. 90 tablet 3   diltiazem (CARDIZEM CD) 120 MG 24 hr capsule TAKE 1 CAPSULE BY MOUTH DAILY 90 capsule 3   lisinopril (ZESTRIL) 20 MG tablet TAKE 1 TABLET BY MOUTH DAILY 90 tablet 3   metFORMIN (GLUCOPHAGE-XR) 500 MG 24 hr tablet TAKE 2 TABLETS BY MOUTH DAILY  WITH BREAKFAST 180 tablet 3   Multiple Vitamin (MULTIVITAMIN WITH MINERALS) TABS tablet Take 1 tablet by mouth daily.     omeprazole (PRILOSEC) 20 MG capsule TAKE 1 CAPSULE BY MOUTH DAILY 90 capsule 3   valACYclovir (VALTREX) 1000 MG tablet Take 2 tablets (2,000 mg total) by mouth 2 (two) times daily. For 1 day for cold sore 20 tablet 1   XARELTO 20 MG TABS tablet TAKE 1 TABLET BY MOUTH DAILY  WITH SUPPER 90 tablet 3   No current facility-administered medications on file prior to visit.    Allergies  Allergen Reactions   Atorvastatin Other (See Comments)    Memory problems    Past Medical History:  Diagnosis Date   Allergic rhinitis    Anxiety     Arthritis    Atrial fibrillation (HCC)    Diabetes (HCC)    Elevated LFTs 2000   fatty liver   GERD (gastroesophageal reflux disease)    Glucose intolerance (impaired glucose tolerance)    Gout    Hyperlipidemia    Hypertension    IBS (irritable bowel syndrome)    Obstructive sleep apnea    Osteoarthritis     Past Surgical History:  Procedure Laterality Date   NO PAST SURGERIES      Family History  Problem Relation Age of Onset   Hypertension Mother    Diabetes Mother    Heart disease Father        CABG at 41   Cancer Father        lung   Colon polyps Father    Diabetes Sister    Diabetes Brother    Heart failure Brother    Colon cancer Neg Hx    Stomach cancer Neg Hx    Esophageal cancer Neg Hx     Social History   Socioeconomic History   Marital status: Married    Spouse name: Not on file   Number of children: 1   Years of education: Not on file   Highest education level: Not on file  Occupational History   Occupation:  Industrial / Proofreader     Comment: Retired  Tobacco Use   Smoking status: Never   Smokeless tobacco: Never  Building services engineer Use: Never used  Substance and Sexual Activity   Alcohol use: Yes    Comment: Occasionally   Drug use: No   Sexual activity: Not on file  Other Topics Concern   Not on file  Social History Narrative   Not on file   Social Determinants of Health   Financial Resource Strain: Not on file  Food Insecurity: Not on file  Transportation Needs: Not on file  Physical Activity: Not on file  Stress: Not on file  Social Connections: Not on file  Intimate Partner Violence: Not on file   Review of Systems  Constitutional:  Negative for fatigue and unexpected weight change.       Wears seat belt  HENT:  Negative for dental problem and tinnitus.        Wife is concerned about his hearing---he doesn't note a problem Keeps up with dentist  Eyes:  Negative for visual disturbance.       No diplopia or  unilateral vision loss  Respiratory:  Negative for cough, chest tightness and shortness of breath.   Cardiovascular:  Negative for chest pain, palpitations and leg swelling.  Gastrointestinal:  Negative for blood in stool and constipation.       No heartburn   Endocrine: Negative for polydipsia and polyuria.  Genitourinary:  Negative for difficulty urinating and urgency.       No sexual problems  Musculoskeletal:  Negative for arthralgias, back pain and joint swelling.  Skin:  Negative for rash.  Allergic/Immunologic: Positive for environmental allergies. Negative for immunocompromised state.       Uses cetirizine  Neurological:  Negative for dizziness, syncope, light-headedness and headaches.  Hematological:  Negative for adenopathy. Does not bruise/bleed easily.  Psychiatric/Behavioral:  Negative for sleep disturbance. The patient is not nervous/anxious.        Objective:   Physical Exam Constitutional:      Appearance: Normal appearance.  HENT:     Mouth/Throat:     Pharynx: No oropharyngeal exudate or posterior oropharyngeal erythema.  Eyes:     Conjunctiva/sclera: Conjunctivae normal.     Pupils: Pupils are equal, round, and reactive to light.  Cardiovascular:     Rate and Rhythm: Normal rate and regular rhythm.     Pulses: Normal pulses.     Heart sounds: No murmur heard.    No gallop.  Pulmonary:     Effort: Pulmonary effort is normal.     Breath sounds: Normal breath sounds. No wheezing or rales.  Abdominal:     Palpations: Abdomen is soft.     Tenderness: There is no abdominal tenderness.  Musculoskeletal:     Cervical back: Neck supple.     Right lower leg: No edema.     Left lower leg: No edema.  Lymphadenopathy:     Cervical: No cervical adenopathy.  Skin:    Findings: No lesion or rash.     Comments: No foot lesions  Neurological:     General: No focal deficit present.     Mental Status: He is alert and oriented to person, place, and time.     Comments:  Normal sensation in feet  Psychiatric:        Mood and Affect: Mood normal.        Behavior: Behavior normal.  Assessment & Plan:

## 2022-12-19 NOTE — Assessment & Plan Note (Signed)
Mild chronic issues are stable---still some grieving On citalopram 20

## 2022-12-19 NOTE — Assessment & Plan Note (Signed)
Reuglar on the diltiazem On the xarelto 20 mg

## 2022-12-24 ENCOUNTER — Telehealth: Payer: Self-pay

## 2022-12-24 NOTE — Telephone Encounter (Signed)
-----   Message from [name], MD sent at 12/19/2022  4:28 PM EDT ----- Results released Find out if he is willing to try a different cholesterol med--if so, send rosuvastatin 5mg  daily (for 1 year)

## 2022-12-24 NOTE — Telephone Encounter (Signed)
Left message to call office or send a MyChart message with his response.

## 2023-01-06 ENCOUNTER — Telehealth: Payer: Self-pay | Admitting: Internal Medicine

## 2023-01-06 NOTE — Telephone Encounter (Signed)
Charles Schwalbe, MD  to Me  Charles Phelps     01/06/23  1:02 PM Probably needs imaging (at least CT scan) so ER is probably more appropriate  Pt notified as instructed and pt voiced understanding and plans to go to [facility] ED sometime today.

## 2023-01-06 NOTE — Telephone Encounter (Signed)
FYI: This call has been transferred to Access Nurse. Once the result note has been entered staff can address the message at that time.  Patient called in with the following symptoms:  Red Word: tingling in right arm,lightheadedness   Please advise at Mobile [phone] (mobile)  Message is routed to Provider Pool and [facility] Triage

## 2023-01-06 NOTE — Telephone Encounter (Signed)
Per access nurse note pt agreed to be seen within 4 hrs to [facility].sending note to Dr Reynaldo Minium and [facility] pool.

## 2023-01-19 ENCOUNTER — Other Ambulatory Visit: Payer: Self-pay | Admitting: Internal Medicine

## 2023-02-20 DIAGNOSIS — E113293 Type 2 diabetes mellitus with mild nonproliferative diabetic retinopathy without macular edema, bilateral: Secondary | ICD-10-CM | POA: Diagnosis not present

## 2023-04-05 ENCOUNTER — Encounter (HOSPITAL_COMMUNITY): Payer: Self-pay

## 2023-04-05 ENCOUNTER — Emergency Department (HOSPITAL_COMMUNITY): Payer: BC Managed Care – PPO

## 2023-04-05 ENCOUNTER — Other Ambulatory Visit: Payer: Self-pay

## 2023-04-05 ENCOUNTER — Inpatient Hospital Stay (HOSPITAL_COMMUNITY)
Admission: EM | Admit: 2023-04-05 | Discharge: 2023-04-09 | DRG: 066 | Disposition: A | Discharge status: Home / Self Care | Payer: BC Managed Care – PPO | Attending: Family Medicine | Admitting: Family Medicine

## 2023-04-05 DIAGNOSIS — F1010 Alcohol abuse, uncomplicated: Secondary | ICD-10-CM | POA: Diagnosis present

## 2023-04-05 DIAGNOSIS — Z79899 Other long term (current) drug therapy: Secondary | ICD-10-CM

## 2023-04-05 DIAGNOSIS — Z888 Allergy status to other drugs, medicaments and biological substances status: Secondary | ICD-10-CM

## 2023-04-05 DIAGNOSIS — E785 Hyperlipidemia, unspecified: Secondary | ICD-10-CM | POA: Diagnosis present

## 2023-04-05 DIAGNOSIS — I639 Cerebral infarction, unspecified: Secondary | ICD-10-CM | POA: Diagnosis not present

## 2023-04-05 DIAGNOSIS — E041 Nontoxic single thyroid nodule: Secondary | ICD-10-CM | POA: Diagnosis not present

## 2023-04-05 DIAGNOSIS — G8321 Monoplegia of upper limb affecting right dominant side: Secondary | ICD-10-CM | POA: Diagnosis present

## 2023-04-05 DIAGNOSIS — M109 Gout, unspecified: Secondary | ICD-10-CM | POA: Diagnosis present

## 2023-04-05 DIAGNOSIS — E1122 Type 2 diabetes mellitus with diabetic chronic kidney disease: Secondary | ICD-10-CM

## 2023-04-05 DIAGNOSIS — I480 Paroxysmal atrial fibrillation: Secondary | ICD-10-CM | POA: Diagnosis not present

## 2023-04-05 DIAGNOSIS — R29700 NIHSS score 0: Secondary | ICD-10-CM | POA: Diagnosis present

## 2023-04-05 DIAGNOSIS — B001 Herpesviral vesicular dermatitis: Secondary | ICD-10-CM | POA: Diagnosis present

## 2023-04-05 DIAGNOSIS — Z7901 Long term (current) use of anticoagulants: Secondary | ICD-10-CM | POA: Diagnosis not present

## 2023-04-05 DIAGNOSIS — E876 Hypokalemia: Secondary | ICD-10-CM | POA: Diagnosis present

## 2023-04-05 DIAGNOSIS — R531 Weakness: Secondary | ICD-10-CM | POA: Diagnosis present

## 2023-04-05 DIAGNOSIS — I6322 Cerebral infarction due to unspecified occlusion or stenosis of basilar arteries: Secondary | ICD-10-CM | POA: Diagnosis not present

## 2023-04-05 DIAGNOSIS — Z8673 Personal history of transient ischemic attack (TIA), and cerebral infarction without residual deficits: Secondary | ICD-10-CM

## 2023-04-05 DIAGNOSIS — I129 Hypertensive chronic kidney disease with stage 1 through stage 4 chronic kidney disease, or unspecified chronic kidney disease: Secondary | ICD-10-CM | POA: Diagnosis not present

## 2023-04-05 DIAGNOSIS — I10 Essential (primary) hypertension: Secondary | ICD-10-CM | POA: Diagnosis not present

## 2023-04-05 DIAGNOSIS — N1831 Chronic kidney disease, stage 3a: Secondary | ICD-10-CM | POA: Diagnosis present

## 2023-04-05 DIAGNOSIS — R278 Other lack of coordination: Secondary | ICD-10-CM | POA: Diagnosis present

## 2023-04-05 DIAGNOSIS — K760 Fatty (change of) liver, not elsewhere classified: Secondary | ICD-10-CM | POA: Diagnosis present

## 2023-04-05 DIAGNOSIS — I6523 Occlusion and stenosis of bilateral carotid arteries: Secondary | ICD-10-CM | POA: Diagnosis not present

## 2023-04-05 DIAGNOSIS — Z833 Family history of diabetes mellitus: Secondary | ICD-10-CM

## 2023-04-05 DIAGNOSIS — F419 Anxiety disorder, unspecified: Secondary | ICD-10-CM | POA: Diagnosis present

## 2023-04-05 DIAGNOSIS — R270 Ataxia, unspecified: Secondary | ICD-10-CM | POA: Diagnosis present

## 2023-04-05 DIAGNOSIS — G4730 Sleep apnea, unspecified: Secondary | ICD-10-CM | POA: Diagnosis not present

## 2023-04-05 DIAGNOSIS — E1165 Type 2 diabetes mellitus with hyperglycemia: Secondary | ICD-10-CM | POA: Diagnosis not present

## 2023-04-05 DIAGNOSIS — Z83719 Family history of colon polyps, unspecified: Secondary | ICD-10-CM | POA: Diagnosis not present

## 2023-04-05 DIAGNOSIS — Z7984 Long term (current) use of oral hypoglycemic drugs: Secondary | ICD-10-CM

## 2023-04-05 DIAGNOSIS — Z8249 Family history of ischemic heart disease and other diseases of the circulatory system: Secondary | ICD-10-CM

## 2023-04-05 DIAGNOSIS — E669 Obesity, unspecified: Secondary | ICD-10-CM | POA: Diagnosis present

## 2023-04-05 DIAGNOSIS — Z1152 Encounter for screening for COVID-19: Secondary | ICD-10-CM | POA: Diagnosis not present

## 2023-04-05 DIAGNOSIS — G4733 Obstructive sleep apnea (adult) (pediatric): Secondary | ICD-10-CM | POA: Diagnosis not present

## 2023-04-05 DIAGNOSIS — R519 Headache, unspecified: Secondary | ICD-10-CM | POA: Diagnosis not present

## 2023-04-05 DIAGNOSIS — I6389 Other cerebral infarction: Secondary | ICD-10-CM | POA: Diagnosis not present

## 2023-04-05 DIAGNOSIS — E119 Type 2 diabetes mellitus without complications: Secondary | ICD-10-CM | POA: Diagnosis not present

## 2023-04-05 DIAGNOSIS — I651 Occlusion and stenosis of basilar artery: Secondary | ICD-10-CM | POA: Diagnosis not present

## 2023-04-05 DIAGNOSIS — Z6831 Body mass index (BMI) 31.0-31.9, adult: Secondary | ICD-10-CM

## 2023-04-05 LAB — CBC
HCT: 42.7 % (ref 39.0–52.0)
Hemoglobin: 14.8 g/dL (ref 13.0–17.0)
MCH: 28.7 pg (ref 26.0–34.0)
MCHC: 34.7 g/dL (ref 30.0–36.0)
MCV: 82.8 fL (ref 80.0–100.0)
Platelets: 173 10*3/uL (ref 150–400)
RBC: 5.16 MIL/uL (ref 4.22–5.81)
RDW: 13.3 % (ref 11.5–15.5)
WBC: 7.4 10*3/uL (ref 4.0–10.5)
nRBC: 0 % (ref 0.0–0.2)

## 2023-04-05 LAB — COMPREHENSIVE METABOLIC PANEL
ALT: 32 U/L (ref 0–44)
AST: 32 U/L (ref 15–41)
Albumin: 3.9 g/dL (ref 3.5–5.0)
Alkaline Phosphatase: 74 U/L (ref 38–126)
Anion gap: 14 (ref 5–15)
BUN: 14 mg/dL (ref 6–20)
CO2: 19 mmol/L — ABNORMAL LOW (ref 22–32)
Calcium: 8.8 mg/dL — ABNORMAL LOW (ref 8.9–10.3)
Chloride: 102 mmol/L (ref 98–111)
Creatinine, Ser: 1.63 mg/dL — ABNORMAL HIGH (ref 0.61–1.24)
GFR, Estimated: 49 mL/min — ABNORMAL LOW (ref >60)
Glucose, Bld: 299 mg/dL — ABNORMAL HIGH (ref 70–99)
Potassium: 3.7 mmol/L (ref 3.5–5.1)
Sodium: 135 mmol/L (ref 135–145)
Total Bilirubin: 1.2 mg/dL (ref 0.3–1.2)
Total Protein: 6.7 g/dL (ref 6.5–8.1)

## 2023-04-05 LAB — SARS CORONAVIRUS 2 BY RT PCR: SARS Coronavirus 2 by RT PCR: NEGATIVE

## 2023-04-05 LAB — URINALYSIS, ROUTINE W REFLEX MICROSCOPIC
Bacteria, UA: NONE SEEN
Bilirubin Urine: NEGATIVE
Glucose, UA: >=500 mg/dL — AB
Hgb urine dipstick: NEGATIVE
Ketones, ur: 5 mg/dL — AB
Leukocytes,Ua: NEGATIVE
Nitrite: NEGATIVE
Protein, ur: NEGATIVE mg/dL
Specific Gravity, Urine: 1.03 (ref 1.005–1.030)
pH: 6 (ref 5.0–8.0)

## 2023-04-05 MED ORDER — ACETAMINOPHEN 650 MG RE SUPP: 650.0000 mg | Freq: Four times a day (QID) | RECTAL | Status: DC | PRN

## 2023-04-05 MED ORDER — IOHEXOL 300 MG/ML  SOLN
60.0000 mL | Freq: Once | INTRAMUSCULAR | Status: AC | PRN
  Administered 2023-04-05: 60 mL via INTRAVENOUS

## 2023-04-05 MED ORDER — VITAMIN B-12 1000 MCG PO TABS
1000.0000 ug | ORAL_TABLET | Freq: Every day | ORAL | Status: DC
  Administered 2023-04-05 – 2023-04-08 (×4): 1000 ug via ORAL
  Filled 2023-04-05 (×4): qty 1

## 2023-04-05 MED ORDER — RIVAROXABAN 10 MG PO TABS
20.0000 mg | ORAL_TABLET | Freq: Every day | ORAL | Status: DC
  Administered 2023-04-05: 20 mg via ORAL
  Filled 2023-04-05: qty 2

## 2023-04-05 MED ORDER — CITALOPRAM HYDROBROMIDE 10 MG PO TABS
20.0000 mg | ORAL_TABLET | Freq: Every day | ORAL | Status: DC
  Administered 2023-04-06 – 2023-04-09 (×4): 20 mg via ORAL
  Filled 2023-04-05 (×4): qty 2

## 2023-04-05 MED ORDER — PANTOPRAZOLE SODIUM 40 MG PO TBEC
40.0000 mg | DELAYED_RELEASE_TABLET | Freq: Every day | ORAL | Status: DC
  Administered 2023-04-06 – 2023-04-09 (×4): 40 mg via ORAL
  Filled 2023-04-05 (×4): qty 1

## 2023-04-05 MED ORDER — ACETAMINOPHEN 325 MG PO TABS: 650.0000 mg | ORAL_TABLET | Freq: Four times a day (QID) | ORAL | Status: DC | PRN

## 2023-04-05 MED ORDER — LACTATED RINGERS IV SOLN: INTRAVENOUS | Status: DC

## 2023-04-05 MED ORDER — INSULIN ASPART 100 UNIT/ML IJ SOLN
0.0000 [IU] | Freq: Three times a day (TID) | INTRAMUSCULAR | Status: DC
  Administered 2023-04-06 – 2023-04-07 (×2): 1 [IU] via SUBCUTANEOUS
  Administered 2023-04-07: 2 [IU] via SUBCUTANEOUS
  Administered 2023-04-07 – 2023-04-08 (×4): 1 [IU] via SUBCUTANEOUS
  Administered 2023-04-09: 2 [IU] via SUBCUTANEOUS

## 2023-04-05 MED ORDER — LISINOPRIL 20 MG PO TABS
20.0000 mg | ORAL_TABLET | Freq: Every day | ORAL | Status: DC
  Administered 2023-04-06 – 2023-04-09 (×4): 20 mg via ORAL
  Filled 2023-04-05 (×4): qty 1

## 2023-04-05 MED ORDER — GADOBUTROL 1 MMOL/ML IV SOLN
10.0000 mL | Freq: Once | INTRAVENOUS | Status: AC | PRN
  Administered 2023-04-05: 10 mL via INTRAVENOUS

## 2023-04-05 MED ORDER — ONDANSETRON HCL 4 MG/2ML IJ SOLN: 4.0000 mg | Freq: Four times a day (QID) | INTRAMUSCULAR | Status: DC | PRN

## 2023-04-05 MED ORDER — DILTIAZEM HCL ER COATED BEADS 120 MG PO CP24
120.0000 mg | ORAL_CAPSULE | Freq: Every day | ORAL | Status: DC
  Administered 2023-04-06 – 2023-04-08 (×4): 120 mg via ORAL
  Filled 2023-04-05 (×4): qty 1

## 2023-04-05 MED ORDER — SORBITOL 70 % SOLN: 30.0000 mL | Freq: Every day | Status: DC | PRN

## 2023-04-05 MED ORDER — ONDANSETRON HCL 4 MG PO TABS: 4.0000 mg | ORAL_TABLET | Freq: Four times a day (QID) | ORAL | Status: DC | PRN

## 2023-04-05 MED ORDER — DAPAGLIFLOZIN PROPANEDIOL 10 MG PO TABS: 10.0000 mg | ORAL_TABLET | Freq: Every day | ORAL | Status: DC

## 2023-04-05 NOTE — ED Notes (Signed)
Patient transported to MRI 

## 2023-04-05 NOTE — ED Notes (Addendum)
Patient awaiting transport to MRI.

## 2023-04-05 NOTE — ED Notes (Signed)
ED TO INPATIENT HANDOFF REPORT  ED Nurse Name and Phone #: Milagros Loll [phone]  S Name/Age/Gender Charles Phelps 57 y.o. male Room/Bed: H015C/H015C  Code Status   Code Status: Full Code  Home/SNF/Other Home Patient oriented to: situation Is this baseline? Yes   Triage Complete: Triage complete  Chief Complaint Acute CVA (cerebrovascular accident) [facility]) [I63.9]  Triage Note Pt came in via POV d/t feeling uncoordinated with a HA & some nausea for the past 2-3 days. Reports this has happened in the past before a couple of times for several days. Denies the feeling of dizziness just feel like he fumbles with his feet. A/Ox4, denies pain while in triage.    Allergies Allergies  Allergen Reactions   Atorvastatin Other (See Comments)    Memory problems    Level of Care/Admitting Diagnosis ED Disposition     ED Disposition  Admit   Condition  --   Comment  Hospital Area: [facility] [[id number]]  Level of Care: Progressive [102]  Admit to Progressive based on following criteria: NEUROLOGICAL AND NEUROSURGICAL complex patients with significant risk of instability, who do not meet ICU criteria, yet require close observation or frequent assessment (< / = every 2 - 4 hours) with medical / nursing intervention.  May admit patient to [facility] or [facility] if equivalent level of care is available:: No  Covid Evaluation: Asymptomatic - no recent exposure (last 10 days) testing not required  Diagnosis: Acute CVA (cerebrovascular accident) [facility]) [[id number]]  Admitting Physician: Buena Irish [[id number]]  Attending Physician: Buena Irish [phone]  Certification:: I certify this patient will need inpatient services for at least 2 midnights  Expected Medical Readiness: 04/07/2023          B Medical/Surgery History Past Medical History:  Diagnosis Date   Allergic rhinitis    Anxiety    Arthritis    Atrial fibrillation (HCC)    Diabetes (HCC)    Elevated LFTs  2000   fatty liver   GERD (gastroesophageal reflux disease)    Glucose intolerance (impaired glucose tolerance)    Gout    Hyperlipidemia    Hypertension    IBS (irritable bowel syndrome)    Obstructive sleep apnea    Osteoarthritis    Past Surgical History:  Procedure Laterality Date   NO PAST SURGERIES       A IV Location/Drains/Wounds Patient Lines/Drains/Airways Status     Active Line/Drains/Airways     Name Placement date Placement time Site Days   Peripheral IV 04/05/23 20 G Anterior;Distal;Left;Upper Arm 04/05/23  1423  Arm  less than 1            Intake/Output Last 24 hours No intake or output data in the 24 hours ending 04/05/23 1939  Labs/Imaging Results for orders placed or performed during the hospital encounter of 04/05/23 (from the past 48 hour(s))  Urinalysis, Routine w reflex microscopic -Urine, Clean Catch     Status: Abnormal   Collection Time: 04/05/23 11:57 AM  Result Value Ref Range   Color, Urine YELLOW YELLOW   APPearance CLEAR CLEAR   Specific Gravity, Urine 1.030 1.005 - 1.030   pH 6.0 5.0 - 8.0   Glucose, UA >=500 (A) NEGATIVE mg/dL   Hgb urine dipstick NEGATIVE NEGATIVE   Bilirubin Urine NEGATIVE NEGATIVE   Ketones, ur 5 (A) NEGATIVE mg/dL   Protein, ur NEGATIVE NEGATIVE mg/dL   Nitrite NEGATIVE NEGATIVE   Leukocytes,Ua NEGATIVE NEGATIVE   RBC / HPF 0-5 0 -  5 RBC/hpf   WBC, UA 0-5 0 - 5 WBC/hpf   Bacteria, UA NONE SEEN NONE SEEN   Squamous Epithelial / HPF 0-5 0 - 5 /HPF    Comment: Performed at [facility], [address] [place], [place] [address]  Comprehensive metabolic panel     Status: Abnormal   Collection Time: 04/05/23 12:01 PM  Result Value Ref Range   Sodium 135 135 - 145 mmol/L   Potassium 3.7 3.5 - 5.1 mmol/L   Chloride 102 98 - 111 mmol/L   CO2 19 (L) 22 - 32 mmol/L   Glucose, Bld 299 (H) 70 - 99 mg/dL    Comment: Glucose reference range applies only to samples taken after fasting for at least 8 hours.    BUN 14 6 - 20 mg/dL   Creatinine, Ser 2.95 (H) 0.61 - 1.24 mg/dL   Calcium 8.8 (L) 8.9 - 10.3 mg/dL   Total Protein 6.7 6.5 - 8.1 g/dL   Albumin 3.9 3.5 - 5.0 g/dL   AST 32 15 - 41 U/L   ALT 32 0 - 44 U/L   Alkaline Phosphatase 74 38 - 126 U/L   Total Bilirubin 1.2 0.3 - 1.2 mg/dL   GFR, Estimated 49 (L) >60 mL/min    Comment: (NOTE) Calculated using the CKD-EPI Creatinine Equation (2021)    Anion gap 14 5 - 15    Comment: Performed at [facility], [address], [place], [place] [address]  CBC     Status: None   Collection Time: 04/05/23 12:01 PM  Result Value Ref Range   WBC 7.4 4.0 - 10.5 K/uL   RBC 5.16 4.22 - 5.81 MIL/uL   Hemoglobin 14.8 13.0 - 17.0 g/dL   HCT 24.4 01.0 - 27.2 %   MCV 82.8 80.0 - 100.0 fL   MCH 28.7 26.0 - 34.0 pg   MCHC 34.7 30.0 - 36.0 g/dL   RDW 53.6 64.4 - 03.4 %   Platelets 173 150 - 400 K/uL   nRBC 0.0 0.0 - 0.2 %    Comment: Performed at [facility], [address] [place], [place] [address]  SARS Coronavirus 2 by RT PCR (hospital order, performed in [facility] hospital lab) *cepheid single result test* Anterior Nasal Swab     Status: None   Collection Time: 04/05/23 12:16 PM   Specimen: Anterior Nasal Swab  Result Value Ref Range   SARS Coronavirus 2 by RT PCR NEGATIVE NEGATIVE    Comment: Performed at [facility], [address] [place], [place] [address]   CT Head Wo Contrast  Result Date: 04/05/2023 CLINICAL DATA:  Headache, increasing frequency or severity EXAM: CT HEAD WITHOUT CONTRAST TECHNIQUE: Contiguous axial images were obtained from the base of the skull through the vertex without intravenous contrast. RADIATION DOSE REDUCTION: This exam was performed according to the departmental dose-optimization program which includes automated exposure control, adjustment of the mA and/or kV according to patient size and/or use of iterative reconstruction technique. COMPARISON:  None Available. FINDINGS: Brain: No evidence of  acute infarction, hemorrhage, hydrocephalus, extra-axial collection or mass lesion/mass effect. There is an age indeterminate, but likely chronic infarct in the central pons. Vascular: No hyperdense vessel or unexpected calcification. Skull: Normal. Negative for fracture or focal lesion. Sinuses/Orbits: No middle ear mastoid effusion. Paranasal sinuses are clear. Orbits are unremarkable. Other: None. IMPRESSION: 1. Age indeterminate, but likely chronic infarct in the central pons. If there is clinical  concern for acute infarct, consider MRI for further evaluation. 2. Otherwise no specific CT etiology for headaches identified Electronically Signed   By: Lorenza Cambridge M.D.   On: 04/05/2023 13:38    Pending Labs Unresulted Labs (From admission, onward)     Start     Ordered   04/06/23 0500  Magnesium  Tomorrow morning,   R        04/05/23 1936   04/06/23 0500  TSH  Tomorrow morning,   R        04/05/23 1936   04/06/23 0500  Basic metabolic panel  Tomorrow morning,   R        04/05/23 1936   04/06/23 0500  CBC  Tomorrow morning,   R        04/05/23 1936   04/05/23 1934  HIV Antibody (routine testing w rflx)  (HIV Antibody (Routine testing w reflex) panel)  Once,   R        04/05/23 1936            Vitals/Pain Today's Vitals   04/05/23 1155 04/05/23 1301 04/05/23 1408 04/05/23 1754  BP:  (!) 154/114    Pulse:  62    Resp:  16    Temp:  98.2 F (36.8 C)    TempSrc:  Oral    SpO2:  98%    Weight: 115.7 kg     Height: 6\' 3"  (1.905 m)     PainSc: 0-No pain  0-No pain 0-No pain    Isolation Precautions Airborne and Contact precautions  Medications Medications  acetaminophen (TYLENOL) tablet 650 mg (has no administration in time range)    Or  acetaminophen (TYLENOL) suppository 650 mg (has no administration in time range)  ondansetron (ZOFRAN) tablet 4 mg (has no administration in time range)    Or  ondansetron (ZOFRAN) injection 4 mg (has no administration in time range)   sorbitol 70 % solution 30 mL (has no administration in time range)  lactated ringers infusion (has no administration in time range)  gadobutrol (GADAVIST) 1 MMOL/ML injection 10 mL (10 mLs Intravenous Contrast Given 04/05/23 1545)  iohexol (OMNIPAQUE) 300 MG/ML solution 60 mL (60 mLs Intravenous Contrast Given 04/05/23 1632)    Mobility walks     Focused Assessments Neuro Assessment Handoff:  Swallow screen pass? Yes          Neuro Assessment: Exceptions to [facility] Neuro Checks:      Has TPA been given? No If patient is a Neuro Trauma and patient is going to OR before floor call report to 4N Charge nurse: [phone] or [phone]   R Recommendations: See Admitting Provider Note  Report given to:   Additional Notes: N/A

## 2023-04-05 NOTE — ED Provider Notes (Signed)
[place] EMERGENCY DEPARTMENT AT [facility] Provider Note   CSN: [id number] Arrival date & time: 04/05/23  1130     History  Chief Complaint  Patient presents with   Feeling Unccoordinated    Nausea   Headache    Charles Phelps is a 57 y.o. male with a past medical history of A-fib on Xarelto, diabetes, hypertension, hyperlipidemia presents today for evaluation of dizziness.  Patient reports that he has been feeling off balance for the past 3 days.  Patient reports that this has happened in the past before a couple of times for several days. He denies any recent fall or head trauma.  He denies any room spinning sensation.  Symptoms are intermittent.  He denies any headache, vision changes, nausea, vomiting, chest pain, shortness of breath, bowel change, urinary symptoms.   Headache   Past Medical History:  Diagnosis Date   Allergic rhinitis    Anxiety    Arthritis    Atrial fibrillation (HCC)    Diabetes (HCC)    Elevated LFTs 2000   fatty liver   GERD (gastroesophageal reflux disease)    Glucose intolerance (impaired glucose tolerance)    Gout    Hyperlipidemia    Hypertension    IBS (irritable bowel syndrome)    Obstructive sleep apnea    Osteoarthritis    Past Surgical History:  Procedure Laterality Date   NO PAST SURGERIES       Home Medications Prior to Admission medications   Medication Sig Start Date End Date Taking? Authorizing Provider  cetirizine (ZYRTEC) 10 MG tablet Take 10 mg by mouth daily.   Yes [provider]  citalopram (CELEXA) 20 MG tablet TAKE 1 TABLET BY MOUTH DAILY 07/30/22  Yes Tillman Abide I, MD  colchicine 0.6 MG tablet TAKE 1 TABLET BY MOUTH  TWICE DAILY AS NEEDED 04/03/21  Yes [name], MD  cyanocobalamin 1000 MCG tablet Take 1,000 mcg by mouth daily.   Yes [provider]  dapagliflozin propanediol (FARXIGA) 10 MG TABS tablet Take 1 tablet (10 mg total) by mouth daily before breakfast. 05/28/22   Yes [name], MD  diltiazem (CARDIZEM CD) 120 MG 24 hr capsule TAKE 1 CAPSULE BY MOUTH DAILY 07/30/22  Yes [name], MD  hyoscyamine (LEVSIN) 0.125 MG tablet Take 1 tablet (0.125 mg total) by mouth 3 (three) times daily as needed. Patient taking differently: Take 0.125 mg by mouth daily. 12/19/22  Yes Tillman Abide I, MD  lisinopril (ZESTRIL) 20 MG tablet TAKE 1 TABLET BY MOUTH DAILY 07/30/22  Yes Tillman Abide I, MD  metFORMIN (GLUCOPHAGE-XR) 500 MG 24 hr tablet TAKE 2 TABLETS BY MOUTH DAILY  WITH BREAKFAST 08/29/22  Yes [name], MD  Methylcellulose, Laxative, (CITRUCEL) 500 MG TABS Take 1 tablet by mouth at bedtime.   Yes [provider]  Multiple Vitamin (MULTIVITAMIN WITH MINERALS) TABS tablet Take 1 tablet by mouth daily.   Yes [provider]  omeprazole (PRILOSEC) 20 MG capsule TAKE 1 CAPSULE BY MOUTH DAILY 07/30/22  Yes Tillman Abide I, MD  valACYclovir (VALTREX) 1000 MG tablet TAKE 2 TABLETS BY MOUTH TWICE  DAILY FOR 1 DAY FOR COLD SORE 01/20/23  Yes [name], MD  XARELTO 20 MG TABS tablet TAKE 1 TABLET BY MOUTH DAILY  WITH SUPPER 05/23/22  Yes Tillman Abide I, MD  glucose blood (ONETOUCH VERIO) test strip Use to check blood sugar 1-2 times daily 12/19/22   Tillman Abide  I, MD  OneTouch Delica Lancets 33G MISC Use to obtain blood sugar sample 1-2 times daily. 12/19/22   [name], MD      Allergies    Atorvastatin    Review of Systems   Review of Systems  Neurological:  Positive for headaches.    Physical Exam Updated Vital Signs BP (!) 154/114 (BP Location: Right Arm)   Pulse 62   Temp 98.2 F (36.8 C) (Oral)   Resp 16   Ht 6\' 3"  (1.905 m)   Wt 115.7 kg   SpO2 98%   BMI 31.87 kg/m  Physical Exam Vitals and nursing note reviewed.  Constitutional:      Appearance: Normal appearance.  HENT:     Head: Normocephalic and atraumatic.     Mouth/Throat:     Mouth: Mucous membranes are moist.  Eyes:     General:  No scleral icterus. Cardiovascular:     Rate and Rhythm: Normal rate and regular rhythm.     Pulses: Normal pulses.     Heart sounds: Normal heart sounds.  Pulmonary:     Effort: Pulmonary effort is normal.     Breath sounds: Normal breath sounds.  Abdominal:     General: Abdomen is flat.     Palpations: Abdomen is soft.     Tenderness: There is no abdominal tenderness.  Musculoskeletal:        General: No deformity.  Skin:    General: Skin is warm.     Findings: No rash.  Neurological:     General: No focal deficit present.     Mental Status: He is alert.     Comments: Cranial nerves II through XII intact. Intact sensation to light touch in all 4 extremities. 5/5 strength in all 4 extremities. Intact finger-to-nose and heel-to-shin of all 4 extremities. No visual field cuts. No neglect noted. No aphasia noted.   Psychiatric:        Mood and Affect: Mood normal.     ED Results / Procedures / Treatments   Labs (all labs ordered are listed, but only abnormal results are displayed) Labs Reviewed  COMPREHENSIVE METABOLIC PANEL - Abnormal; Notable for the following components:      Result Value   CO2 19 (*)    Glucose, Bld 299 (*)    Creatinine, Ser 1.63 (*)    Calcium 8.8 (*)    GFR, Estimated 49 (*)    All other components within normal limits  URINALYSIS, ROUTINE W REFLEX MICROSCOPIC - Abnormal; Notable for the following components:   Glucose, UA >=500 (*)    Ketones, ur 5 (*)    All other components within normal limits  SARS CORONAVIRUS 2 BY RT PCR  CBC  HIV ANTIBODY (ROUTINE TESTING W REFLEX)  MAGNESIUM  TSH  BASIC METABOLIC PANEL  CBC    EKG EKG Interpretation Date/Time:  Saturday April 05 2023 12:20:55 EDT Ventricular Rate:  71 PR Interval:  162 QRS Duration:  92 QT Interval:  410 QTC Calculation: 445 R Axis:   39  Text Interpretation: Normal sinus rhythm Minimal voltage criteria for LVH, may be normal variant ( R in aVL ) Borderline ECG No previous  ECGs available Confirmed by Ernie Avena ([id number]) on 04/05/2023 1:47:24 PM  Radiology CT ANGIO HEAD NECK W WO CM  Result Date: 04/05/2023 CLINICAL DATA:  Abnormal balance. Abnormal CT head. Pontine infarct. EXAM: CT ANGIOGRAPHY HEAD AND NECK WITH AND WITHOUT CONTRAST TECHNIQUE: Multidetector CT imaging of the head  and neck was performed using the standard protocol during bolus administration of intravenous contrast. Multiplanar CT image reconstructions and MIPs were obtained to evaluate the vascular anatomy. Carotid stenosis measurements (when applicable) are obtained utilizing NASCET criteria, using the distal internal carotid diameter as the denominator. RADIATION DOSE REDUCTION: This exam was performed according to the departmental dose-optimization program which includes automated exposure control, adjustment of the mA and/or kV according to patient size and/or use of iterative reconstruction technique. CONTRAST:  60mL OMNIPAQUE IOHEXOL 300 MG/ML  SOLN COMPARISON:  None Available. FINDINGS: CTA NECK FINDINGS Aortic arch: A 3 vessel arch configuration is present. Minimal calcification present in the distal arch. No significant stenosis or aneurysm is present. Right carotid system: The right common carotid artery is within normal limits. The bifurcation is unremarkable. The cervical right ICA is normal. Left carotid system: The left common carotid artery is within normal limits. Minimal calcifications present bifurcation without significant stenosis. The cervical left ICA is normal. Vertebral arteries: The left vertebral artery is the dominant vessel. Both vertebral arteries originate from the vertebral arteries without significant stenosis. No significant stenosis is present in either vertebral artery in the neck. Skeleton: The vertebral body heights and alignment are normal. No focal osseous lesions are present. Other neck: A 1.8 cm hypodense nodule is present within the right lobe of the thyroid. No other  thyroid lesions are present. The submandibular and parotid glands and ducts are within normal limits. No significant adenopathy is present. Upper chest: The lung apices are clear. The thoracic inlet is within normal limits. Review of the MIP images confirms the above findings CTA HEAD FINDINGS Anterior circulation: Minimal calcification is present in the cavernous internal carotid arteries bilaterally. No significant stenosis is present through the ICA termini. The A1 and M1 segments are normal. The anterior communicating artery is patent. MCA bifurcations within normal limits bilaterally. The ACA and MCA branch vessels are normal. Posterior circulation: The left vertebral artery is the dominant vessel. The right vertebral artery is centrally terminates at the PICA. Left PICA origin is visualized and normal. A high-grade stenosis is present in the proximal basilar artery. The more distal basilar artery is small. Both posterior scratched at both superior cerebellar arteries are patent. The left posterior cerebral artery originates from basilar tip. The right PCA is of fetal type. The PCA branch vessels are within normal limits bilaterally. Venous sinuses: The dural sinuses are patent. The straight sinus and deep cerebral veins are intact. Cortical veins are within normal limits. No significant vascular malformation is evident. Anatomic variants: Fetal type right posterior cerebral artery. Review of the MIP images confirms the above findings IMPRESSION: 1. 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 This likely contributes to the brainstem infarct. 2. The more distal basilar artery is small. 3. Fetal type right posterior cerebral artery. 4. 1.8 cm hypodense nodule within the right lobe of the thyroid. Recommend non-emergent thyroid ultrasound. Reference: J Am Coll Radiol. 2015 Feb;12(2): 143-50 Electronically Signed   By: Marin Roberts M.D.   On: 04/05/2023 18:45   MR BRAIN W WO CONTRAST  Result  Date: 04/05/2023 CLINICAL DATA:  Patient feeling off balance.  Abnormal head CT. EXAM: MRI HEAD WITHOUT AND WITH CONTRAST TECHNIQUE: Multiplanar, multiecho pulse sequences of the brain and surrounding structures were obtained without and with intravenous contrast. CONTRAST:  10mL GADAVIST GADOBUTROL 1 MMOL/ML IV SOLN COMPARISON:  CT head without contrast 04/05/2023 FINDINGS: Brain: The diffusion-weighted images demonstrate acute/subacute nonhemorrhagic right pontine infarct. The left paramedian  pontine infarct is remote. A remote nonhemorrhagic infarct is present in the right lentiform nucleus. No additional infarcts are present. No significant white matter lesions are present. Deep brain nuclei are within normal limits. The ventricles are of normal size. A remote lacunar infarct is present in the lateral left cerebellum. Vascular: Flow is present in the major intracranial arteries. Skull and upper cervical spine: The craniocervical junction is normal. Upper cervical spine is within normal limits. Marrow signal is unremarkable. Sinuses/Orbits: The paranasal sinuses and mastoid air cells are clear. The globes and orbits are within normal limits. IMPRESSION: 1. Acute/subacute nonhemorrhagic right pontine infarct. 2. Remote left paramedian pontine infarct. 3. Remote nonhemorrhagic infarct of the right lentiform nucleus. 4. Remote lacunar infarct of the lateral left cerebellum. These results were called by telephone at the time of interpretation on 04/05/2023 at 6:35 pm to provider Carnelia Oscar , who verbally acknowledged these results. Electronically Signed   By: Marin Roberts M.D.   On: 04/05/2023 18:35   CT Head Wo Contrast  Result Date: 04/05/2023 CLINICAL DATA:  Headache, increasing frequency or severity EXAM: CT HEAD WITHOUT CONTRAST TECHNIQUE: Contiguous axial images were obtained from the base of the skull through the vertex without intravenous contrast. RADIATION DOSE REDUCTION: This exam was performed  according to the departmental dose-optimization program which includes automated exposure control, adjustment of the mA and/or kV according to patient size and/or use of iterative reconstruction technique. COMPARISON:  None Available. FINDINGS: Brain: No evidence of acute infarction, hemorrhage, hydrocephalus, extra-axial collection or mass lesion/mass effect. There is an age indeterminate, but likely chronic infarct in the central pons. Vascular: No hyperdense vessel or unexpected calcification. Skull: Normal. Negative for fracture or focal lesion. Sinuses/Orbits: No middle ear mastoid effusion. Paranasal sinuses are clear. Orbits are unremarkable. Other: None. IMPRESSION: 1. Age indeterminate, but likely chronic infarct in the central pons. If there is clinical concern for acute infarct, consider MRI for further evaluation. 2. Otherwise no specific CT etiology for headaches identified Electronically Signed   By: Lorenza Cambridge M.D.   On: 04/05/2023 13:38    Procedures Procedures    Medications Ordered in ED Medications  acetaminophen (TYLENOL) tablet 650 mg (has no administration in time range)    Or  acetaminophen (TYLENOL) suppository 650 mg (has no administration in time range)  ondansetron (ZOFRAN) tablet 4 mg (has no administration in time range)    Or  ondansetron (ZOFRAN) injection 4 mg (has no administration in time range)  sorbitol 70 % solution 30 mL (has no administration in time range)  lactated ringers infusion (has no administration in time range)  gadobutrol (GADAVIST) 1 MMOL/ML injection 10 mL (10 mLs Intravenous Contrast Given 04/05/23 1545)  iohexol (OMNIPAQUE) 300 MG/ML solution 60 mL (60 mLs Intravenous Contrast Given 04/05/23 1632)    ED Course/ Medical Decision Making/ A&P                                 Medical Decision Making Amount and/or Complexity of Data Reviewed Labs: ordered. Radiology: ordered.  Risk Prescription drug management. Decision regarding  hospitalization.   This patient presents to the ED for dizziness, this involves an extensive number of treatment options, and is a complaint that carries with a high risk of complications and morbidity.  The differential diagnosis includes CVA, ICH, multiple sclerosis, acoustic neuroma, carotid artery dissection, BPPV, labyrinthitis, Mnire's.  This is not an exhaustive list.  Lab tests:  I ordered and personally interpreted labs.  The pertinent results include: WBC unremarkable. Hbg unremarkable. Platelets unremarkable. Electrolytes unremarkable. BUN, creatinine unremarkable.  UA is unremarkable.  Imaging studies: I ordered imaging studies, personally reviewed, interpreted imaging and agree with the radiologist's interpretations. The results include: MRI brain showed acute/subacute nonhemorrhagic right pontine infarct, remote infarction in the left pontine, right lentiform nucleus, left cerebellum.  CT angio showed high-grade stenosis of the proximal basilar artery and the distal basilar arteries is small.  Problem list/ ED course/ Critical interventions/ Medical management: HPI: See above Vital signs within normal range and stable throughout visit. Laboratory/imaging studies significant for: See above. On physical examination, patient is afebrile and appears in no acute distress.  There was no focal neurological deficit on my examination.  CBC with no leukocytosis or anemia.  CMP with no acute electrolyte abnormalities or AKI.  Symptoms have been going on for 3 days. Will consult neurology.  He is hemodynamically stable at this point. I have reviewed the patient home medicines and have made adjustments as needed.  Cardiac monitoring/EKG: The patient was maintained on a cardiac monitor.  I personally reviewed and interpreted the cardiac monitor which showed an underlying rhythm of: sinus rhythm.  Additional history obtained: External records from outside source obtained and reviewed  including: Chart review including previous notes, labs, imaging.  Consultations obtained: I spoke to Dr. Amada Jupiter neurology.  I discussed MRI and CT angio head and neck results with him. He recommended admission to medicine for further evaluation and management. I spoke to Dr. Dimas Chyle [facility].  She agreed to admit the patient.  Disposition Admit.  This chart was dictated using voice recognition software.  Despite best efforts to proofread,  errors can occur which can change the documentation meaning.          Final Clinical Impression(s) / ED Diagnoses Final diagnoses:  Acute CVA (cerebrovascular accident) [facility])    Rx / DC Orders ED Discharge Orders     None         Jeanelle Malling, [place] 04/05/23 2057    Ernie Avena, MD 04/05/23 2153

## 2023-04-05 NOTE — Consult Note (Signed)
Neurology Consultation Reason for Consult: Stroke Referring Physician: Gasper Sells, C  CC: Clumsiness  History is obtained from: Patient  HPI: Charles Phelps is a 57 y.o. male who woke up Thursday morning feeling slightly offkilter.  He states that he never had vertigo, double vision, numbness or weakness but he did just feel like he was less coordinated than he typically is.  He went out to golf today, and felt like he had "never swung a golf club before."  And that is what caused him to seek further evaluation in the emergency room.  In the ER, an MRI was performed which demonstrates a sizable pontine infarct.  Of note, he does have a history of atrial fibrillation and takes Xarelto, but does not take it routinely with a meal.   LKW: Wednesday evening tnk given?: no, outside of window  Past Medical History:  Diagnosis Date   Allergic rhinitis    Anxiety    Arthritis    Atrial fibrillation (HCC)    Diabetes (HCC)    Elevated LFTs 2000   fatty liver   GERD (gastroesophageal reflux disease)    Glucose intolerance (impaired glucose tolerance)    Gout    Hyperlipidemia    Hypertension    IBS (irritable bowel syndrome)    Obstructive sleep apnea    Osteoarthritis      Family History  Problem Relation Age of Onset   Hypertension Mother    Diabetes Mother    Heart disease Father        CABG at 98   Cancer Father        lung   Colon polyps Father    Diabetes Sister    Diabetes Brother    Heart failure Brother    Colon cancer Neg Hx    Stomach cancer Neg Hx    Esophageal cancer Neg Hx      Social History:  reports that he has never smoked. He has never used smokeless tobacco. He reports current alcohol use. He reports that he does not use drugs.   Exam: Current vital signs: BP (!) 154/114 (BP Location: Right Arm)   Pulse 62   Temp 98.2 F (36.8 C) (Oral)   Resp 16   Ht 6\' 3"  (1.905 m)   Wt 115.7 kg   SpO2 98%   BMI 31.87 kg/m  Vital signs in last 24  hours: Temp:  [98.2 F (36.8 C)-98.6 F (37 C)] 98.2 F (36.8 C) (09/07 1301) Pulse Rate:  [62-71] 62 (09/07 1301) Resp:  [16-18] 16 (09/07 1301) BP: (154-165)/(96-114) 154/114 (09/07 1301) SpO2:  [98 %-100 %] 98 % (09/07 1301) Weight:  [115.7 kg] 115.7 kg (09/07 1155)   Physical Exam  Appears well-developed and well-nourished.   Neuro: Mental Status: Patient is awake, alert, oriented to person, place, month, year, and situation. Patient is able to give a clear and coherent history. No signs of aphasia or neglect Cranial Nerves: II: Visual Fields are full. Pupils are equal, round, and reactive to light.   III,IV, VI: EOMI without ptosis or diploplia.  V: Facial sensation is symmetric to temperature VII: Facial movement is symmetric.  VIII: hearing is intact to voice X: Uvula elevates symmetrically XI: Shoulder shrug is symmetric. XII: tongue is midline without atrophy or fasciculations.  Motor: Tone is normal. Bulk is normal. 5/5 strength was present in all four extremities.  Sensory: Sensation is symmetric to light touch and temperature in the arms and legs. Deep Tendon Reflexes: 2+ and  symmetric in the biceps and patellae.  Plantars: Toes are downgoing bilaterally.  Cerebellar: He is slightly slower on finger-nose-finger and heel-knee-shin on the left compared to the right    I have reviewed labs in epic and the results pertinent to this consultation are: Creatinine 1.63  I have reviewed the images obtained: MRI brain-sizable pontine infarct on the right  Impression: 57 year old male who is now at least 3 days out from onset of symptoms and is found to have a pontine infarct.  He is on Xarelto, but given this infarct, I do wonder about changing him to another anticoagulant such as Eliquis which is not dependent on a large meal for 24-hour coverage.  Given the extensive posterior circulation disease, more likely that this is the etiology than his atrial fibrillation.   He will need assessment for modifiable risk factors.  Recommendations: - HgbA1c, fasting lipid panel - Frequent neuro checks - Echocardiogram - Prophylactic therapy-Xarelto - Risk factor modification - Telemetry monitoring - PT consult, OT consult, Speech consult - Stroke team to follow    Ritta Slot, MD [facility] [phone]  If 7pm- 7am, please page neurology on call as listed in AMION.

## 2023-04-05 NOTE — H&P (Signed)
History and Physical    Patient: Charles Phelps DOB: 07-29-1966 DOA: 04/05/2023 DOS: the patient was seen and examined on 04/05/2023 PCP: [name], MD  Patient coming from: Home  Chief Complaint:  Chief Complaint  Patient presents with   Feeling Unccoordinated    Nausea   Headache   HPI: Charles Phelps is a 57 y.o. male with medical history significant for obstructive sleep apnea on CPAP, hypertension, hyperlipidemia, and atrial fibrillation on Xarelto. He reports that for the last 5 days he has had some trouble with depth perception and feeling uncoordinated.  He has noticed some difficulty parking his car and when he first stands up he feels like he might fall over.  He denied any headache or weakness initially he just felt uncoordinated.  This morning he went to play golf and he could not hit any of the balls.  He felt like he had never swung a golf club before.  He did feel a little weak in both of his arms today when swinging the club.  When he went home and told his wife she had him come into the emergency department for evaluation. In the emergency department the patient had an MRI which revealed an acute pontine stroke.  The final results were not available at the time of this dictation but they were called to the emergency physician.  Neurology was consulted first and it was recommended that the hospitalist admit the patient. The patient reports that he had similar symptoms, though less severe, approximately 7 weeks ago. The symptoms resolved on their own after 5 days.  Review of Systems: As mentioned in the history of present illness. All other systems reviewed and are negative. Past Medical History:  Diagnosis Date   Allergic rhinitis    Anxiety    Arthritis    Atrial fibrillation (HCC)    Diabetes (HCC)    Elevated LFTs 2000   fatty liver   GERD (gastroesophageal reflux disease)    Glucose intolerance (impaired glucose tolerance)    Gout     Hyperlipidemia    Hypertension    IBS (irritable bowel syndrome)    Obstructive sleep apnea    Osteoarthritis    Past Surgical History:  Procedure Laterality Date   NO PAST SURGERIES     Social History:  reports that he has never smoked. He has never used smokeless tobacco. He reports current alcohol use. He reports that he does not use drugs.  Allergies  Allergen Reactions   Atorvastatin Other (See Comments)    Memory problems    Family History  Problem Relation Age of Onset   Hypertension Mother    Diabetes Mother    Heart disease Father        CABG at 52   Cancer Father        lung   Colon polyps Father    Diabetes Sister    Diabetes Brother    Heart failure Brother    Colon cancer Neg Hx    Stomach cancer Neg Hx    Esophageal cancer Neg Hx     Prior to Admission medications   Medication Sig Start Date End Date Taking? Authorizing Provider  cetirizine (ZYRTEC) 10 MG tablet Take 10 mg by mouth daily.   Yes [provider]  citalopram (CELEXA) 20 MG tablet TAKE 1 TABLET BY MOUTH DAILY 07/30/22  Yes Tillman Abide I, MD  colchicine 0.6 MG tablet TAKE 1 TABLET BY MOUTH  TWICE DAILY  AS NEEDED 04/03/21  Yes [name], MD  cyanocobalamin 1000 MCG tablet Take 1,000 mcg by mouth daily.   Yes [provider]  dapagliflozin propanediol (FARXIGA) 10 MG TABS tablet Take 1 tablet (10 mg total) by mouth daily before breakfast. 05/28/22  Yes [name], MD  diltiazem (CARDIZEM CD) 120 MG 24 hr capsule TAKE 1 CAPSULE BY MOUTH DAILY 07/30/22  Yes [name], MD  hyoscyamine (LEVSIN) 0.125 MG tablet Take 1 tablet (0.125 mg total) by mouth 3 (three) times daily as needed. Patient taking differently: Take 0.125 mg by mouth daily. 12/19/22  Yes Tillman Abide I, MD  lisinopril (ZESTRIL) 20 MG tablet TAKE 1 TABLET BY MOUTH DAILY 07/30/22  Yes Tillman Abide I, MD  metFORMIN (GLUCOPHAGE-XR) 500 MG 24 hr tablet TAKE 2 TABLETS BY MOUTH DAILY  WITH BREAKFAST  08/29/22  Yes [name], MD  Methylcellulose, Laxative, (CITRUCEL) 500 MG TABS Take 1 tablet by mouth at bedtime.   Yes [provider]  Multiple Vitamin (MULTIVITAMIN WITH MINERALS) TABS tablet Take 1 tablet by mouth daily.   Yes [provider]  omeprazole (PRILOSEC) 20 MG capsule TAKE 1 CAPSULE BY MOUTH DAILY 07/30/22  Yes Tillman Abide I, MD  valACYclovir (VALTREX) 1000 MG tablet TAKE 2 TABLETS BY MOUTH TWICE  DAILY FOR 1 DAY FOR COLD SORE 01/20/23  Yes [name], MD  XARELTO 20 MG TABS tablet TAKE 1 TABLET BY MOUTH DAILY  WITH SUPPER 05/23/22  Yes Tillman Abide I, MD  glucose blood (ONETOUCH VERIO) test strip Use to check blood sugar 1-2 times daily 12/19/22   [name], MD  OneTouch Delica Lancets 33G MISC Use to obtain blood sugar sample 1-2 times daily. 12/19/22   [name], MD    Physical Exam: Vitals:   04/05/23 1151 04/05/23 1155 04/05/23 1301  BP: (!) 165/96  (!) 154/114  Pulse: 71  62  Resp: 18  16  Temp: 98.6 F (37 C)  98.2 F (36.8 C)  TempSrc: Oral  Oral  SpO2: 100%  98%  Weight:  115.7 kg   Height:  6\' 3"  (1.905 m)    Physical Exam:  General: No acute distress, well developed, well nourished HEENT: Normocephalic, atraumatic, PERRL Cardiovascular: Normal rate and rhythm. Distal pulses intact. Pulmonary: Normal pulmonary effort, normal breath sounds Gastrointestinal: Nondistended abdomen, soft, non-tender, normoactive bowel sounds Musculoskeletal:Normal ROM, no lower ext edema Lymphadenopathy: No cervical LAD. Skin: Skin is warm and dry. Neuro: AAOx3. Left upp ext w mild drift.  Left fast alt movements slower w left fingers. Left past pointing of nose. left lower ext slightly less coordinated. PSYCH: Attentive and cooperative  Data Reviewed:  Results for orders placed or performed during the hospital encounter of 04/05/23 (from the past 24 hour(s))  Urinalysis, Routine w reflex microscopic -Urine, Clean Catch      Status: Abnormal   Collection Time: 04/05/23 11:57 AM  Result Value Ref Range   Color, Urine YELLOW YELLOW   APPearance CLEAR CLEAR   Specific Gravity, Urine 1.030 1.005 - 1.030   pH 6.0 5.0 - 8.0   Glucose, UA >=500 (A) NEGATIVE mg/dL   Hgb urine dipstick NEGATIVE NEGATIVE   Bilirubin Urine NEGATIVE NEGATIVE   Ketones, ur 5 (A) NEGATIVE mg/dL   Protein, ur NEGATIVE NEGATIVE mg/dL   Nitrite NEGATIVE NEGATIVE   Leukocytes,Ua NEGATIVE NEGATIVE   RBC / HPF 0-5 0 - 5 RBC/hpf   WBC, UA 0-5 0 - 5  WBC/hpf   Bacteria, UA NONE SEEN NONE SEEN   Squamous Epithelial / HPF 0-5 0 - 5 /HPF  Comprehensive metabolic panel     Status: Abnormal   Collection Time: 04/05/23 12:01 PM  Result Value Ref Range   Sodium 135 135 - 145 mmol/L   Potassium 3.7 3.5 - 5.1 mmol/L   Chloride 102 98 - 111 mmol/L   CO2 19 (L) 22 - 32 mmol/L   Glucose, Bld 299 (H) 70 - 99 mg/dL   BUN 14 6 - 20 mg/dL   Creatinine, Ser 1.61 (H) 0.61 - 1.24 mg/dL   Calcium 8.8 (L) 8.9 - 10.3 mg/dL   Total Protein 6.7 6.5 - 8.1 g/dL   Albumin 3.9 3.5 - 5.0 g/dL   AST 32 15 - 41 U/L   ALT 32 0 - 44 U/L   Alkaline Phosphatase 74 38 - 126 U/L   Total Bilirubin 1.2 0.3 - 1.2 mg/dL   GFR, Estimated 49 (L) >60 mL/min   Anion gap 14 5 - 15  CBC     Status: None   Collection Time: 04/05/23 12:01 PM  Result Value Ref Range   WBC 7.4 4.0 - 10.5 K/uL   RBC 5.16 4.22 - 5.81 MIL/uL   Hemoglobin 14.8 13.0 - 17.0 g/dL   HCT 09.6 04.5 - 40.9 %   MCV 82.8 80.0 - 100.0 fL   MCH 28.7 26.0 - 34.0 pg   MCHC 34.7 30.0 - 36.0 g/dL   RDW 81.1 91.4 - 78.2 %   Platelets 173 150 - 400 K/uL   nRBC 0.0 0.0 - 0.2 %  SARS Coronavirus 2 by RT PCR (hospital order, performed in [facility] hospital lab) *cepheid single result test* Anterior Nasal Swab     Status: None   Collection Time: 04/05/23 12:16 PM   Specimen: Anterior Nasal Swab  Result Value Ref Range   SARS Coronavirus 2 by RT PCR NEGATIVE NEGATIVE  ;ts  Assessment and Plan: # Acute pontine  CVA # High-grade stenosis of posterior circulation -Awaiting neurology recommendations -PT/OT/SLP -Not sure if he will need an echo as the high-grade stenosis is likely the cause not embolic event.  # Afib-continue Eliquis for now  # DMT2 -will hold metformin because of his contrast.  Will hold [place].  Will add corrective dose insulin.  # CKD -creatinine  is 1.6 today.  Baseline creatinine is 1.4    Advance Care Planning:   Code Status: Full Code  The patient will be full code by default. CODE STATUS was not discussed.  Consults: neurology  Family Communication: none  Severity of Illness: The appropriate patient status for this patient is INPATIENT. Inpatient status is judged to be reasonable and necessary in order to provide the required intensity of service to ensure the patient's safety. The patient's presenting symptoms, physical exam findings, and initial radiographic and laboratory data in the context of their chronic comorbidities is felt to place them at high risk for further clinical deterioration. Furthermore, it is not anticipated that the patient will be medically stable for discharge from the hospital within 2 midnights of admission.   * I certify that at the point of admission it is my clinical judgment that the patient will require inpatient hospital care spanning beyond 2 midnights from the point of admission due to high intensity of service, high risk for further deterioration and high frequency of surveillance required.*  Author: Buena Irish, MD 04/05/2023 8:50 PM  For on call review  [email or web address]

## 2023-04-05 NOTE — ED Triage Notes (Signed)
Pt came in via POV d/t feeling uncoordinated with a HA & some nausea for the past 2-3 days. Reports this has happened in the past before a couple of times for several days. Denies the feeling of dizziness just feel like he fumbles with his feet. A/Ox4, denies pain while in triage.

## 2023-04-05 NOTE — ED Provider Triage Note (Signed)
Emergency Medicine Provider Triage Evaluation Note  Charles Phelps , a 57 y.o. male  was evaluated in triage.  Pt complains of feeling off balance.  Symptom has been going on for the last 2 to 3 days.  He denies any recent fall or head trauma.  History of A-fib on Xarelto.  He endorses similar symptoms about a couple months ago.  He denies any vision changes, headache, dizziness, chest pain, shortness of breath, nausea, vomiting, bowel change, urinary symptoms.  Review of Systems  Positive: As above Negative: As above  Physical Exam  BP (!) 165/96 (BP Location: Right Arm)   Pulse 71   Temp 98.6 F (37 C) (Oral)   Resp 18   Ht 6\' 3"  (1.905 m)   Wt 115.7 kg   SpO2 100%   BMI 31.87 kg/m  Gen:   Awake, no distress   Resp:  Normal effort  MSK:   Moves extremities without difficulty  Other:  No focal neurological deficit.  Medical Decision Making  Medically screening exam initiated at 12:10 PM.  Appropriate orders placed.  Charles Phelps was informed that the remainder of the evaluation will be completed by another provider, this initial triage assessment does not replace that evaluation, and the importance of remaining in the ED until their evaluation is complete.    Jeanelle Malling, [place] 04/05/23 1214

## 2023-04-06 DIAGNOSIS — I639 Cerebral infarction, unspecified: Secondary | ICD-10-CM | POA: Diagnosis not present

## 2023-04-06 LAB — CBC
HCT: 40.6 % (ref 39.0–52.0)
Hemoglobin: 14.1 g/dL (ref 13.0–17.0)
MCH: 29.6 pg (ref 26.0–34.0)
MCHC: 34.7 g/dL (ref 30.0–36.0)
MCV: 85.3 fL (ref 80.0–100.0)
Platelets: 155 10*3/uL (ref 150–400)
RBC: 4.76 MIL/uL (ref 4.22–5.81)
RDW: 13.3 % (ref 11.5–15.5)
WBC: 7.1 10*3/uL (ref 4.0–10.5)
nRBC: 0 % (ref 0.0–0.2)

## 2023-04-06 LAB — BASIC METABOLIC PANEL
Anion gap: 12 (ref 5–15)
BUN: 13 mg/dL (ref 6–20)
CO2: 24 mmol/L (ref 22–32)
Calcium: 8.7 mg/dL — ABNORMAL LOW (ref 8.9–10.3)
Chloride: 104 mmol/L (ref 98–111)
Creatinine, Ser: 1.32 mg/dL — ABNORMAL HIGH (ref 0.61–1.24)
GFR, Estimated: >60 mL/min (ref >60)
Glucose, Bld: 142 mg/dL — ABNORMAL HIGH (ref 70–99)
Potassium: 3 mmol/L — ABNORMAL LOW (ref 3.5–5.1)
Sodium: 140 mmol/L (ref 135–145)

## 2023-04-06 LAB — TSH: TSH: 2.157 u[IU]/mL (ref 0.350–4.500)

## 2023-04-06 LAB — GLUCOSE, CAPILLARY
Glucose-Capillary: 177 mg/dL — ABNORMAL HIGH (ref 70–99)
Glucose-Capillary: 175 mg/dL — ABNORMAL HIGH (ref 70–99)
Glucose-Capillary: 173 mg/dL — ABNORMAL HIGH (ref 70–99)

## 2023-04-06 LAB — HEMOGLOBIN A1C
Hgb A1c MFr Bld: 8 % — ABNORMAL HIGH (ref 4.8–5.6)
Mean Plasma Glucose: 182.9 mg/dL

## 2023-04-06 LAB — CBG MONITORING, ED: Glucose-Capillary: 205 mg/dL — ABNORMAL HIGH (ref 70–99)

## 2023-04-06 LAB — MAGNESIUM: Magnesium: 1.8 mg/dL (ref 1.7–2.4)

## 2023-04-06 LAB — HIV ANTIBODY (ROUTINE TESTING W REFLEX): HIV Screen 4th Generation wRfx: NONREACTIVE

## 2023-04-06 MED ORDER — POTASSIUM CHLORIDE CRYS ER 20 MEQ PO TBCR
40.0000 meq | EXTENDED_RELEASE_TABLET | ORAL | Status: AC
  Administered 2023-04-06 (×2): 40 meq via ORAL
  Filled 2023-04-06 (×2): qty 2

## 2023-04-06 MED ORDER — HEPARIN (PORCINE) 25000 UT/250ML-% IV SOLN
1600.0000 [IU]/h | INTRAVENOUS | Status: DC
  Administered 2023-04-06 (×2): 1400 [IU]/h via INTRAVENOUS
  Administered 2023-04-07: 1500 [IU]/h via INTRAVENOUS
  Administered 2023-04-08 – 2023-04-09 (×2): 1600 [IU]/h via INTRAVENOUS
  Filled 2023-04-06 (×4): qty 250

## 2023-04-06 MED ORDER — DICLOFENAC SODIUM 1 % EX GEL
4.0000 g | Freq: Four times a day (QID) | CUTANEOUS | Status: DC | PRN
  Administered 2023-04-06: 4 g via TOPICAL
  Filled 2023-04-06: qty 100

## 2023-04-06 NOTE — ED Notes (Signed)
Report received and care assumed from Latanya Presser., RN

## 2023-04-06 NOTE — Progress Notes (Addendum)
Paged Dr. Jacqulyn Bath regarding Pt. BP: 179/93, and regarding Pts. Concern about his potassium level of 3.0.

## 2023-04-06 NOTE — Progress Notes (Signed)
ANTICOAGULATION CONSULT NOTE - Initial Consult  Pharmacy Consult for heparin Indication: atrial fibrillation  Allergies  Allergen Reactions   Atorvastatin Other (See Comments)    Memory problems    Patient Measurements: Height: 6\' 3"  (190.5 cm) Weight: 115.7 kg (255 lb) IBW/kg (Calculated) : 84.5 Heparin Dosing Weight: 108kg  Vital Signs: Temp: 97.7 F (36.5 C) (09/08 0845) Temp Source: Oral (09/08 0845) BP: 162/85 (09/08 1000) Pulse Rate: 70 (09/08 1000)  Labs: Recent Labs    04/05/23 1201 04/06/23 0100  HGB 14.8 14.1  HCT 42.7 40.6  PLT 173 155  CREATININE 1.63* 1.32*    Estimated Creatinine Clearance: 83.7 mL/min (A) (by C-G formula based on SCr of 1.32 mg/dL (H)).   Medical History: Past Medical History:  Diagnosis Date   Allergic rhinitis    Anxiety    Arthritis    Atrial fibrillation (HCC)    Diabetes (HCC)    Elevated LFTs 2000   fatty liver   GERD (gastroesophageal reflux disease)    Glucose intolerance (impaired glucose tolerance)    Gout    Hyperlipidemia    Hypertension    IBS (irritable bowel syndrome)    Obstructive sleep apnea    Osteoarthritis     Assessment: 70 YOM presenting with CVA, hs of afib on Xarelto PTA, last dose administered 9/7 @2200  in ED  Goal of Therapy:  Heparin level 0.3-0.7 units/ml aPTT 66-102 seconds Monitor platelets by anticoagulation protocol: Yes   Plan:  Heparin gtt at 1400 units/hr, no bolus F/u 6 hour aPTT/HL F/u ability to transition back to Xarelto  Daylene Posey, PharmD, [facility] Clinical Pharmacist ED Pharmacist Phone # [phone] 04/06/2023 12:00 PM

## 2023-04-06 NOTE — Progress Notes (Signed)
PROGRESS NOTE    Charles Phelps  QMV:[id number] DOB: Mar 05, 1966 DOA: 04/05/2023 PCP: [name], MD   Brief Narrative:  HPI: Charles Phelps is a 57 y.o. male with medical history significant for obstructive sleep apnea on CPAP, hypertension, hyperlipidemia, and atrial fibrillation on Xarelto. He reports that for the last 5 days he has had some trouble with depth perception and feeling uncoordinated.  He has noticed some difficulty parking his car and when he first stands up he feels like he might fall over.  He denied any headache or weakness initially he just felt uncoordinated.  This morning he went to play golf and he could not hit any of the balls.  He felt like he had never swung a golf club before.  He did feel a little weak in both of his arms today when swinging the club.  When he went home and told his wife she had him come into the emergency department for evaluation. In the emergency department the patient had an MRI which revealed an acute pontine stroke.  The final results were not available at the time of this dictation but they were called to the emergency physician.  Neurology was consulted first and it was recommended that the hospitalist admit the patient. The patient reports that he had similar symptoms, though less severe, approximately 7 weeks ago. The symptoms resolved on their own after 5 days.  Assessment & Plan:   Principal Problem:   Acute CVA (cerebrovascular accident) (HCC) Active Problems:   Hyperlipemia   Essential hypertension, benign   OSA (obstructive sleep apnea)   Paroxysmal atrial fibrillation (HCC)   Gout  Acute pontine CVA/high-grade stenosis of posterior basilar artery: Symptoms are improving.  Neurology following.  Discussed with Dr. Pearlean Brownie over the phone, they are planning to start him on heparin and get cerebral angiogram which cannot happen until Tuesday.  Based on that, it would be decided whether he needs intervention or  not.  Essential hypertension: Blood pressure elevated 165/100 this morning.  He is on lisinopril.  Although symptoms started almost 5 days ago so he is out of the permissive hypertension window but due to high-grade stenosis, he may need his blood pressure to remain high.  Will wait for neurology recommendations in their note.  Type 2 diabetes mellitus: Takes metformin and Farxiga at home.  Last hemoglobin A1c in May 2024 was 7.5.  Currently on SSI.  Will check hemoglobin A1c.  CKD stage IIIa: At baseline.  DVT prophylaxis: rivaroxaban (XARELTO) tablet 20 mg Start: 04/05/23 2200   Code Status: Full Code  Family Communication:  None present at bedside.  Plan of care discussed with patient in length and he/she verbalized understanding and agreed with it.  Status is: Inpatient Remains inpatient appropriate because: Needs cerebral angiogram.   Estimated body mass index is 31.87 kg/m as calculated from the following:   Height as of this encounter: 6\' 3"  (1.905 m).   Weight as of this encounter: 115.7 kg.    Nutritional Assessment: Body mass index is 31.87 kg/m.Marland Kitchen Seen by dietician.  I agree with the assessment and plan as outlined below: Nutrition Status:        . Skin Assessment: I have examined the patient's skin and I agree with the wound assessment as performed by the wound care RN as outlined below:    Consultants:  Neurology  Procedures:  None  Antimicrobials:  Anti-infectives (From admission, onward)    None  Subjective: Patient seen and examined early in the morning in the ED.  He had no complaints.  He said that he was able to walk in the room without having any issues with the balance.  No other complaint.  Objective: Vitals:   04/06/23 0500 04/06/23 0600 04/06/23 0715 04/06/23 0800  BP: (!) 179/95 (!) 190/85 (!) 158/91 (!) 165/100  Pulse: (!) 54 (!) 56 64 (!) 58  Resp: 14 11 12 13   Temp:      TempSrc:      SpO2: 98% 100% 100% 100%  Weight:       Height:        Intake/Output Summary (Last 24 hours) at 04/06/2023 0827 Last data filed at 04/06/2023 2536 Gross per 24 hour  Intake 1000 ml  Output --  Net 1000 ml   Filed Weights   04/05/23 1155  Weight: 115.7 kg    Examination:  General exam: Appears calm and comfortable  Respiratory system: Clear to auscultation. Respiratory effort normal. Cardiovascular system: S1 & S2 heard, RRR. No JVD, murmurs, rubs, gallops or clicks. No pedal edema. Gastrointestinal system: Abdomen is nondistended, soft and nontender. No organomegaly or masses felt. Normal bowel sounds heard. Central nervous system: Alert and oriented. No focal neurological deficits. Extremities: Symmetric 5 x 5 power. Skin: No rashes, lesions or ulcers Psychiatry: Judgement and insight appear normal. Mood & affect appropriate.    Data Reviewed: I have personally reviewed following labs and imaging studies  CBC: Recent Labs  Lab 04/05/23 1201 04/06/23 0100  WBC 7.4 7.1  HGB 14.8 14.1  HCT 42.7 40.6  MCV 82.8 85.3  PLT 173 155   Basic Metabolic Panel: Recent Labs  Lab 04/05/23 1201 04/06/23 0100  NA 135 140  K 3.7 3.0*  CL 102 104  CO2 19* 24  GLUCOSE 299* 142*  BUN 14 13  CREATININE 1.63* 1.32*  CALCIUM 8.8* 8.7*  MG  --  1.8   GFR: Estimated Creatinine Clearance: 83.7 mL/min (A) (by C-G formula based on SCr of 1.32 mg/dL (H)). Liver Function Tests: Recent Labs  Lab 04/05/23 1201  AST 32  ALT 32  ALKPHOS 74  BILITOT 1.2  PROT 6.7  ALBUMIN 3.9   No results for input(s): "LIPASE", "AMYLASE" in the last 168 hours. No results for input(s): "AMMONIA" in the last 168 hours. Coagulation Profile: No results for input(s): "INR", "PROTIME" in the last 168 hours. Cardiac Enzymes: No results for input(s): "CKTOTAL", "CKMB", "CKMBINDEX", "TROPONINI" in the last 168 hours. BNP (last 3 results) No results for input(s): "PROBNP" in the last 8760 hours. HbA1C: No results for input(s): "HGBA1C" in  the last 72 hours. CBG: No results for input(s): "GLUCAP" in the last 168 hours. Lipid Profile: No results for input(s): "CHOL", "HDL", "LDLCALC", "TRIG", "CHOLHDL", "LDLDIRECT" in the last 72 hours. Thyroid Function Tests: Recent Labs    04/06/23 0100  TSH 2.157   Anemia Panel: No results for input(s): "VITAMINB12", "FOLATE", "FERRITIN", "TIBC", "IRON", "RETICCTPCT" in the last 72 hours. Sepsis Labs: No results for input(s): "PROCALCITON", "LATICACIDVEN" in the last 168 hours.  Recent Results (from the past 240 hour(s))  SARS Coronavirus 2 by RT PCR (hospital order, performed in [facility] hospital lab) *cepheid single result test* Anterior Nasal Swab     Status: None   Collection Time: 04/05/23 12:16 PM   Specimen: Anterior Nasal Swab  Result Value Ref Range Status   SARS Coronavirus 2 by RT PCR NEGATIVE NEGATIVE Final    Comment:  Performed at [facility], [address] [place], [place] [address]     Radiology Studies: CT ANGIO HEAD NECK W WO CM  Result Date: 04/05/2023 CLINICAL DATA:  Abnormal balance. Abnormal CT head. Pontine infarct. EXAM: CT ANGIOGRAPHY HEAD AND NECK WITH AND WITHOUT CONTRAST TECHNIQUE: Multidetector CT imaging of the head and neck was performed using the standard protocol during bolus administration of intravenous contrast. Multiplanar CT image reconstructions and MIPs were obtained to evaluate the vascular anatomy. Carotid stenosis measurements (when applicable) are obtained utilizing NASCET criteria, using the distal internal carotid diameter as the denominator. RADIATION DOSE REDUCTION: This exam was performed according to the departmental dose-optimization program which includes automated exposure control, adjustment of the mA and/or kV according to patient size and/or use of iterative reconstruction technique. CONTRAST:  60mL OMNIPAQUE IOHEXOL 300 MG/ML  SOLN COMPARISON:  None Available. FINDINGS: CTA NECK FINDINGS Aortic arch: A 3 vessel arch  configuration is present. Minimal calcification present in the distal arch. No significant stenosis or aneurysm is present. Right carotid system: The right common carotid artery is within normal limits. The bifurcation is unremarkable. The cervical right ICA is normal. Left carotid system: The left common carotid artery is within normal limits. Minimal calcifications present bifurcation without significant stenosis. The cervical left ICA is normal. Vertebral arteries: The left vertebral artery is the dominant vessel. Both vertebral arteries originate from the vertebral arteries without significant stenosis. No significant stenosis is present in either vertebral artery in the neck. Skeleton: The vertebral body heights and alignment are normal. No focal osseous lesions are present. Other neck: A 1.8 cm hypodense nodule is present within the right lobe of the thyroid. No other thyroid lesions are present. The submandibular and parotid glands and ducts are within normal limits. No significant adenopathy is present. Upper chest: The lung apices are clear. The thoracic inlet is within normal limits. Review of the MIP images confirms the above findings CTA HEAD FINDINGS Anterior circulation: Minimal calcification is present in the cavernous internal carotid arteries bilaterally. No significant stenosis is present through the ICA termini. The A1 and M1 segments are normal. The anterior communicating artery is patent. MCA bifurcations within normal limits bilaterally. The ACA and MCA branch vessels are normal. Posterior circulation: The left vertebral artery is the dominant vessel. The right vertebral artery is centrally terminates at the PICA. Left PICA origin is visualized and normal. A high-grade stenosis is present in the proximal basilar artery. The more distal basilar artery is small. Both posterior scratched at both superior cerebellar arteries are patent. The left posterior cerebral artery originates from basilar  tip. The right PCA is of fetal type. The PCA branch vessels are within normal limits bilaterally. Venous sinuses: The dural sinuses are patent. The straight sinus and deep cerebral veins are intact. Cortical veins are within normal limits. No significant vascular malformation is evident. Anatomic variants: Fetal type right posterior cerebral artery. Review of the MIP images confirms the above findings IMPRESSION: 1. 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 This likely contributes to the brainstem infarct. 2. The more distal basilar artery is small. 3. Fetal type right posterior cerebral artery. 4. 1.8 cm hypodense nodule within the right lobe of the thyroid. Recommend non-emergent thyroid ultrasound. Reference: J Am Coll Radiol. 2015 Feb;12(2): 143-50 Electronically Signed   By: Marin Roberts M.D.   On: 04/05/2023 18:45   MR BRAIN W WO CONTRAST  Result Date: 04/05/2023 CLINICAL DATA:  Patient feeling off balance.  Abnormal  head CT. EXAM: MRI HEAD WITHOUT AND WITH CONTRAST TECHNIQUE: Multiplanar, multiecho pulse sequences of the brain and surrounding structures were obtained without and with intravenous contrast. CONTRAST:  10mL GADAVIST GADOBUTROL 1 MMOL/ML IV SOLN COMPARISON:  CT head without contrast 04/05/2023 FINDINGS: Brain: The diffusion-weighted images demonstrate acute/subacute nonhemorrhagic right pontine infarct. The left paramedian pontine infarct is remote. A remote nonhemorrhagic infarct is present in the right lentiform nucleus. No additional infarcts are present. No significant white matter lesions are present. Deep brain nuclei are within normal limits. The ventricles are of normal size. A remote lacunar infarct is present in the lateral left cerebellum. Vascular: Flow is present in the major intracranial arteries. Skull and upper cervical spine: The craniocervical junction is normal. Upper cervical spine is within normal limits. Marrow signal is unremarkable. Sinuses/Orbits: The  paranasal sinuses and mastoid air cells are clear. The globes and orbits are within normal limits. IMPRESSION: 1. Acute/subacute nonhemorrhagic right pontine infarct. 2. Remote left paramedian pontine infarct. 3. Remote nonhemorrhagic infarct of the right lentiform nucleus. 4. Remote lacunar infarct of the lateral left cerebellum. These results were called by telephone at the time of interpretation on 04/05/2023 at 6:35 pm to provider KEN LE , who verbally acknowledged these results. Electronically Signed   By: Marin Roberts M.D.   On: 04/05/2023 18:35   CT Head Wo Contrast  Result Date: 04/05/2023 CLINICAL DATA:  Headache, increasing frequency or severity EXAM: CT HEAD WITHOUT CONTRAST TECHNIQUE: Contiguous axial images were obtained from the base of the skull through the vertex without intravenous contrast. RADIATION DOSE REDUCTION: This exam was performed according to the departmental dose-optimization program which includes automated exposure control, adjustment of the mA and/or kV according to patient size and/or use of iterative reconstruction technique. COMPARISON:  None Available. FINDINGS: Brain: No evidence of acute infarction, hemorrhage, hydrocephalus, extra-axial collection or mass lesion/mass effect. There is an age indeterminate, but likely chronic infarct in the central pons. Vascular: No hyperdense vessel or unexpected calcification. Skull: Normal. Negative for fracture or focal lesion. Sinuses/Orbits: No middle ear mastoid effusion. Paranasal sinuses are clear. Orbits are unremarkable. Other: None. IMPRESSION: 1. Age indeterminate, but likely chronic infarct in the central pons. If there is clinical concern for acute infarct, consider MRI for further evaluation. 2. Otherwise no specific CT etiology for headaches identified Electronically Signed   By: Lorenza Cambridge M.D.   On: 04/05/2023 13:38    Scheduled Meds:  citalopram  20 mg Oral Daily   cyanocobalamin  1,000 mcg Oral QHS    diltiazem  120 mg Oral Daily   insulin aspart  0-6 Units Subcutaneous TID WC   lisinopril  20 mg Oral Daily   pantoprazole  40 mg Oral Daily   rivaroxaban  20 mg Oral Q supper   Continuous Infusions:  lactated ringers Stopped (04/06/23 0801)     LOS: 1 day   Hughie Closs, MD [facility]  04/06/2023, 8:27 AM   *Please note that this is a verbal dictation therefore any spelling or grammatical errors are due to the "Dragon [employer] One" system interpretation.  Please page via Amion and do not message via secure chat for urgent patient care matters. Secure chat can be used for non urgent patient care matters.  How to contact the [facility] Attending or Consulting provider 7A - 7P or covering provider during after hours 7P -7A, for this patient?  Check the care team in [facility] and look for a) attending/consulting [facility] provider listed and b) the  [facility] team listed. Page or secure chat 7A-7P. Log into [email or web address] and use 's universal password to access. If you do not have the password, please contact the hospital operator. Locate the [facility] provider you are looking for under [facility] and page to a number that you can be directly reached. If you still have difficulty reaching the provider, please page the [facility] (Director on Call) for the Hospitalists listed on amion for assistance.

## 2023-04-06 NOTE — Progress Notes (Addendum)
STROKE TEAM PROGRESS NOTE   BRIEF HPI [name] is a 57 y.o. male with history of OSA (CPAP compliant), hypertension, hyperlipidemia, T2DM, and atrial fibrillation on Xarelto nightly who presents with a 5-day history of discoordination, ataxia and slight bilateral upper extremity weakness.  Yesterday, patient could not play golf as he typically would. Of note patient had 5 days of similar symptoms 6 to 7 weeks ago.  His wife had him come to the emergency department.  MRI showed acute/subacute nonhemorrhagic right pontine infarct.  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  Awaiting echo.  Will need cerebral angiogram to determine if he is a candidate for stenting/coiling.  Last Xarelto p.m. of 9/7 - will need to postpone procedure until Tuesday, September 10 at the earliest.  Currently in [facility].   SIGNIFICANT HOSPITAL EVENTS 9/7: Presented.  MRI: Acute pontine stroke. 9/8: DVT prophylaxis: Xarelto discontinued in favor of heparin with eye towards cerebral angiogram 9/10.  This will determine whether or not further intervention necessary.  INTERIM HISTORY/SUBJECTIVE  On interview, patient is alert and grossly oriented.  Patient said he began to notice some trouble about 5 days ago.  Endorse that he was "walking like a drunk".  Said that he had trouble initiating walking but could maintain the motion.  Yesterday on the golf course, patient said that he felt as if he had "never swung with a golf club before".  Notes 5 days of similar symptoms 6-7 weeks ago.  Has been consistently taking Xarelto nightly for his A-fib, though he has not been taking it with meals necessarily.  Last Xarelto dose 8 PM of 9/7.  Informed patient that plan was to keep him in the hospital until cerebral angiogram could be performed 9/10.  Physical exam almost entirely nonfocal.   OBJECTIVE  CBC    Component Value Date/Time   WBC  7.1 04/06/2023 0100   RBC 4.76 04/06/2023 0100   HGB 14.1 04/06/2023 0100   HCT 40.6 04/06/2023 0100   PLT 155 04/06/2023 0100   MCV 85.3 04/06/2023 0100   MCH 29.6 04/06/2023 0100   MCHC 34.7 04/06/2023 0100   RDW 13.3 04/06/2023 0100   LYMPHSABS 1.3 06/06/2016 0849   MONOABS 0.4 06/06/2016 0849   EOSABS 0.1 06/06/2016 0849   BASOSABS 0.1 06/06/2016 0849    BMET    Component Value Date/Time   NA 140 04/06/2023 0100   K 3.0 (L) 04/06/2023 0100   CL 104 04/06/2023 0100   CO2 24 04/06/2023 0100   GLUCOSE 142 (H) 04/06/2023 0100   BUN 13 04/06/2023 0100   CREATININE 1.32 (H) 04/06/2023 0100   CALCIUM 8.7 (L) 04/06/2023 0100   GFRNONAA >60 04/06/2023 0100    IMAGING past 24 hours CT ANGIO HEAD NECK W WO CM  Result Date: 04/05/2023 CLINICAL DATA:  Abnormal balance. Abnormal CT head. Pontine infarct. EXAM: CT ANGIOGRAPHY HEAD AND NECK WITH AND WITHOUT CONTRAST TECHNIQUE: Multidetector CT imaging of the head and neck was performed using the standard protocol during bolus administration of intravenous contrast. Multiplanar CT image reconstructions and MIPs were obtained to evaluate the vascular anatomy. Carotid stenosis measurements (when applicable) are obtained utilizing NASCET criteria, using the distal internal carotid diameter as the denominator. RADIATION DOSE REDUCTION: This exam was performed according to the departmental dose-optimization program which includes automated exposure control, adjustment of the mA and/or kV according to patient size and/or use of  iterative reconstruction technique. CONTRAST:  60mL OMNIPAQUE IOHEXOL 300 MG/ML  SOLN COMPARISON:  None Available. FINDINGS: CTA NECK FINDINGS Aortic arch: A 3 vessel arch configuration is present. Minimal calcification present in the distal arch. No significant stenosis or aneurysm is present. Right carotid system: The right common carotid artery is within normal limits. The bifurcation is unremarkable. The cervical right ICA is  normal. Left carotid system: The left common carotid artery is within normal limits. Minimal calcifications present bifurcation without significant stenosis. The cervical left ICA is normal. Vertebral arteries: The left vertebral artery is the dominant vessel. Both vertebral arteries originate from the vertebral arteries without significant stenosis. No significant stenosis is present in either vertebral artery in the neck. Skeleton: The vertebral body heights and alignment are normal. No focal osseous lesions are present. Other neck: A 1.8 cm hypodense nodule is present within the right lobe of the thyroid. No other thyroid lesions are present. The submandibular and parotid glands and ducts are within normal limits. No significant adenopathy is present. Upper chest: The lung apices are clear. The thoracic inlet is within normal limits. Review of the MIP images confirms the above findings CTA HEAD FINDINGS Anterior circulation: Minimal calcification is present in the cavernous internal carotid arteries bilaterally. No significant stenosis is present through the ICA termini. The A1 and M1 segments are normal. The anterior communicating artery is patent. MCA bifurcations within normal limits bilaterally. The ACA and MCA branch vessels are normal. Posterior circulation: The left vertebral artery is the dominant vessel. The right vertebral artery is centrally terminates at the PICA. Left PICA origin is visualized and normal. A high-grade stenosis is present in the proximal basilar artery. The more distal basilar artery is small. Both posterior scratched at both superior cerebellar arteries are patent. The left posterior cerebral artery originates from basilar tip. The right PCA is of fetal type. The PCA branch vessels are within normal limits bilaterally. Venous sinuses: The dural sinuses are patent. The straight sinus and deep cerebral veins are intact. Cortical veins are within normal limits. No significant vascular  malformation is evident. Anatomic variants: Fetal type right posterior cerebral artery. Review of the MIP images confirms the above findings IMPRESSION: 1. 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 This likely contributes to the brainstem infarct. 2. The more distal basilar artery is small. 3. Fetal type right posterior cerebral artery. 4. 1.8 cm hypodense nodule within the right lobe of the thyroid. Recommend non-emergent thyroid ultrasound. Reference: J Am Coll Radiol. 2015 Feb;12(2): 143-50 Electronically Signed   By: Marin Roberts M.D.   On: 04/05/2023 18:45   MR BRAIN W WO CONTRAST  Result Date: 04/05/2023 CLINICAL DATA:  Patient feeling off balance.  Abnormal head CT. EXAM: MRI HEAD WITHOUT AND WITH CONTRAST TECHNIQUE: Multiplanar, multiecho pulse sequences of the brain and surrounding structures were obtained without and with intravenous contrast. CONTRAST:  10mL GADAVIST GADOBUTROL 1 MMOL/ML IV SOLN COMPARISON:  CT head without contrast 04/05/2023 FINDINGS: Brain: The diffusion-weighted images demonstrate acute/subacute nonhemorrhagic right pontine infarct. The left paramedian pontine infarct is remote. A remote nonhemorrhagic infarct is present in the right lentiform nucleus. No additional infarcts are present. No significant white matter lesions are present. Deep brain nuclei are within normal limits. The ventricles are of normal size. A remote lacunar infarct is present in the lateral left cerebellum. Vascular: Flow is present in the major intracranial arteries. Skull and upper cervical spine: The craniocervical junction is normal. Upper cervical spine is within normal limits.  Marrow signal is unremarkable. Sinuses/Orbits: The paranasal sinuses and mastoid air cells are clear. The globes and orbits are within normal limits. IMPRESSION: 1. Acute/subacute nonhemorrhagic right pontine infarct. 2. Remote left paramedian pontine infarct. 3. Remote nonhemorrhagic infarct of the right  lentiform nucleus. 4. Remote lacunar infarct of the lateral left cerebellum. These results were called by telephone at the time of interpretation on 04/05/2023 at 6:35 pm to provider KEN LE , who verbally acknowledged these results. Electronically Signed   By: Marin Roberts M.D.   On: 04/05/2023 18:35   CT Head Wo Contrast  Result Date: 04/05/2023 CLINICAL DATA:  Headache, increasing frequency or severity EXAM: CT HEAD WITHOUT CONTRAST TECHNIQUE: Contiguous axial images were obtained from the base of the skull through the vertex without intravenous contrast. RADIATION DOSE REDUCTION: This exam was performed according to the departmental dose-optimization program which includes automated exposure control, adjustment of the mA and/or kV according to patient size and/or use of iterative reconstruction technique. COMPARISON:  None Available. FINDINGS: Brain: No evidence of acute infarction, hemorrhage, hydrocephalus, extra-axial collection or mass lesion/mass effect. There is an age indeterminate, but likely chronic infarct in the central pons. Vascular: No hyperdense vessel or unexpected calcification. Skull: Normal. Negative for fracture or focal lesion. Sinuses/Orbits: No middle ear mastoid effusion. Paranasal sinuses are clear. Orbits are unremarkable. Other: None. IMPRESSION: 1. Age indeterminate, but likely chronic infarct in the central pons. If there is clinical concern for acute infarct, consider MRI for further evaluation. 2. Otherwise no specific CT etiology for headaches identified Electronically Signed   By: Lorenza Cambridge M.D.   On: 04/05/2023 13:38    Vitals:   04/06/23 1000 04/06/23 1115 04/06/23 1119 04/06/23 1152  BP: (!) 162/85 (!) 166/84  (!) 165/92  Pulse: 70 66 68 67  Resp: 16  12 16   Temp:    98.9 F (37.2 C)  TempSrc:    Oral  SpO2: 100% 100%  98%  Weight:      Height:         PHYSICAL EXAM General:  Alert, well-nourished, well-developed patient in no acute  distress Psych:  Mood and affect appropriate for situation CV: Regular rate and rhythm on monitor Respiratory:  Regular, unlabored respirations on room air GI: Abdomen soft and nontender   NEURO:  Mental Status: AA&Ox3, patient is able to give clear and coherent history Speech/Language: speech is without dysarthria or aphasia.  Naming, repetition, fluency, and comprehension intact.  Cranial Nerves:  II: PERRL. Visual fields full.  III, IV, VI: EOMI. Eyelids elevate symmetrically.  Saccadic dysmetria on horizontal gaze left greater than right V: Sensation is intact to light touch and symmetrical to face.  VII: Smile symmetrical VIII: hearing intact to voice. IX, X: Palate elevates symmetrically. Phonation is normal.  ON:[id number] shrug 5/5. XII: tongue is midline without fasciculations. Motor: 5/5 strength to all muscle groups tested.  Tone: is normal and bulk is normal Sensation- Intact to light touch bilaterally. Extinction absent to light touch to DSS.   Coordination: FTN intact bilaterally, HKS: no ataxia in BLE.No drift.  Gait- deferred   ASSESSMENT/PLAN  Acute pontine ischemic infarct in the setting of high-grade basilar artery stenosis.  He also has old left paramedian pontine infarct in the same vascular distribution  Etiology: Patient has high-grade stenosis in the proximal basilar artery.  This is the likely source of patient's repeated brainstem infarct.  Will need evaluation for IR intervention (coil/stent) in form of cerebral angiogram. Will switch patient from  Xarelto to IV heparin, pharmacist guided, in anticipation of this.  Okay to permit higher blood pressures in the hospital to ensure adequate perfusion.  Can continue home lisinopril 20.  Code Stroke CT head: Infarct in central pons ?Chronic CTA head & neck: High-grade stenosis of the proximal basilar artery.  Fetal-type right PCA. MRI acute/subacute nonhemorrhagic right pontine infarct.  Remote left paramedian  pontine infarct.  Remote nonhemorrhagic infarct of right lentiform nucleus.  Remote lacunar infarct of left lateral cerebellum. 2D Echo awaiting LDL No results found for requested labs within last 1095 days. HgbA1c 8.0 VTE prophylaxis -IV heparin from home Xarelto Therapy recommendations: Awaiting recs Disposition: He  Atrial fibrillation Home Meds: Xarelto 20 nightly Continue telemetry monitoring Continue IV heparin as above  Hypertension Home meds: Lisinopril 20, continued Unstable, sBPs 160-180s. Will allow this in setting of basilar stenosis -- real danger of hypoperfusion/further stroke if we treat his HTN too aggressively Blood Pressure Goal: BP less than 180/105   Hyperlipidemia:  Home meds: None LDL No results found for requested labs within last 1095 days., goal < 70 Await fasting lipid panel.  Patient does not take statin.  Diabetes type II uncontrolled Home meds: Metformin 1000 mg daily, Farxiga 10 mg HgbA1c 8.0, goal < 7.0 CBGs Very sensitive sliding scale insulin Recommend close follow-up with PCP for better DM control  Substance Abuse Patient uses alcohol Discuss willingness to abstain from alcohol, assess stage of change  Other Stroke Risk Factors Obesity, Body mass index is 31.87 kg/m., BMI >/= 30 associated with increased stroke risk, recommend weight loss, diet and exercise as appropriate  Current alcohol use Obstructive sleep apnea, on CPAP at home   Other Active Problems 1.8 cm hypodense nodule in the right lobe of the thyroid: Follow-up outpatient  Hospital day # 1  I have personally obtained history,examined this patient, reviewed notes, independently viewed imaging studies, participated in medical decision making and plan of care.ROS completed by me personally and pertinent positives fully documented  I have made any additions or clarifications directly to the above note. Agree with note above.  Patient presented with several incoordination and  balance difficulties due to acute right pontine infarct likely from symptomatic high-grade proximal basilar stenosis.  He had somewhat similar episode of transient incoordination few weeks ago and MRI does show an old left paramedian pontine infarct as well which may have happened at that time.  He does have atrial fibrillation has been compliant with his Xarelto but his pontine infarcts are likely related to symptomatic basilar stenosis which needs further evaluation and treatment.  Recommend hold Xarelto and switch to IV heparin for now.  Check diagnostic cerebral catheter angiogram followed by early angioplasty and stenting.  May need to be started on Brilinta for the elective stenting.  Maintain aggressive risk factor modification.  Will switch back to Xarelto after the procedure of stenting.  Patient also advised to be compliant with using his CPAP for his sleep apnea Long discussion with patient and answered questions.  Discussed with Dr. Sherlon Handing neurointerventionist on-call.  Discussed with Dr. Britta Mccreedy.  Greater than 50% time during this 50-minute visit was spent on counseling and coordination of care about his symptomatic basilar artery stenosis and discussion about plans for further evaluation with diagnostic angiogram and angioplasty stenting.  Delia Heady, MD Medical Director [facility] Pager: [phone] 04/06/2023 3:40 PM   To contact Stroke Continuity provider, please refer to [email or web address]. After hours, contact General Neurology

## 2023-04-06 NOTE — Consult Note (Signed)
Chief Complaint: Patient was seen in consultation today for basilar artery stenosis  Referring Physician(s): Gevena Mart, NP  Supervising Physician: Baldemar Lenis  Patient Status: [facility]  History of Present Illness: Charles Phelps is a 57 y.o. male with PMH significant for anxiety, atrial fibrillation on Xarelto, diabetes, GERD, hyperlipidemia, hypertension, and OSA being seen today in relation to high grade basilar artery stenosis with recent acute CVA. Patient presented to [facility] ED on 04/05/23 with dizziness and feelings of being off-balance. Imaging indicated an acute brainstem CVA and CTA on 9/7 revealed high grade stenosis of the proximal basilar artery. NIR was consulted to evaluate patient for diagnostic angiogram with possible basilar artery stent placement.   Past Medical History:  Diagnosis Date   Allergic rhinitis    Anxiety    Arthritis    Atrial fibrillation (HCC)    Diabetes (HCC)    Elevated LFTs 2000   fatty liver   GERD (gastroesophageal reflux disease)    Glucose intolerance (impaired glucose tolerance)    Gout    Hyperlipidemia    Hypertension    IBS (irritable bowel syndrome)    Obstructive sleep apnea    Osteoarthritis     Past Surgical History:  Procedure Laterality Date   NO PAST SURGERIES      Allergies: Atorvastatin  Medications: Prior to Admission medications   Medication Sig Start Date End Date Taking? Authorizing Provider  cetirizine (ZYRTEC) 10 MG tablet Take 10 mg by mouth daily.   Yes [provider]  citalopram (CELEXA) 20 MG tablet TAKE 1 TABLET BY MOUTH DAILY 07/30/22  Yes Tillman Abide I, MD  colchicine 0.6 MG tablet TAKE 1 TABLET BY MOUTH  TWICE DAILY AS NEEDED 04/03/21  Yes [name], MD  cyanocobalamin 1000 MCG tablet Take 1,000 mcg by mouth daily.   Yes [provider]  dapagliflozin propanediol (FARXIGA) 10 MG TABS tablet Take 1 tablet (10 mg total) by mouth daily before  breakfast. 05/28/22  Yes [name], MD  diltiazem (CARDIZEM CD) 120 MG 24 hr capsule TAKE 1 CAPSULE BY MOUTH DAILY 07/30/22  Yes [name], MD  hyoscyamine (LEVSIN) 0.125 MG tablet Take 1 tablet (0.125 mg total) by mouth 3 (three) times daily as needed. Patient taking differently: Take 0.125 mg by mouth daily. 12/19/22  Yes Tillman Abide I, MD  lisinopril (ZESTRIL) 20 MG tablet TAKE 1 TABLET BY MOUTH DAILY 07/30/22  Yes Tillman Abide I, MD  metFORMIN (GLUCOPHAGE-XR) 500 MG 24 hr tablet TAKE 2 TABLETS BY MOUTH DAILY  WITH BREAKFAST 08/29/22  Yes [name], MD  Methylcellulose, Laxative, (CITRUCEL) 500 MG TABS Take 1 tablet by mouth at bedtime.   Yes [provider]  Multiple Vitamin (MULTIVITAMIN WITH MINERALS) TABS tablet Take 1 tablet by mouth daily.   Yes [provider]  omeprazole (PRILOSEC) 20 MG capsule TAKE 1 CAPSULE BY MOUTH DAILY 07/30/22  Yes Tillman Abide I, MD  valACYclovir (VALTREX) 1000 MG tablet TAKE 2 TABLETS BY MOUTH TWICE  DAILY FOR 1 DAY FOR COLD SORE 01/20/23  Yes [name], MD  XARELTO 20 MG TABS tablet TAKE 1 TABLET BY MOUTH DAILY  WITH SUPPER 05/23/22  Yes Tillman Abide I, MD  glucose blood (ONETOUCH VERIO) test strip Use to check blood sugar 1-2 times daily 12/19/22   [name], MD  OneTouch Delica Lancets 33G MISC Use to obtain blood sugar sample 1-2 times daily. 12/19/22   Alphonsus Sias,  Berneda Rose, MD     Family History  Problem Relation Age of Onset   Hypertension Mother    Diabetes Mother    Heart disease Father        CABG at 50   Cancer Father        lung   Colon polyps Father    Diabetes Sister    Diabetes Brother    Heart failure Brother    Colon cancer Neg Hx    Stomach cancer Neg Hx    Esophageal cancer Neg Hx     Social History   Socioeconomic History   Marital status: Married    Spouse name: Not on file   Number of children: 1   Years of education: Not on file   Highest education level: Not on  file  Occupational History   Occupation: Museum/gallery conservator / Proofreader     Comment: Retired  Tobacco Use   Smoking status: Never   Smokeless tobacco: Never  Vaping Use   Vaping status: Never Used  Substance and Sexual Activity   Alcohol use: Yes    Comment: Occasionally   Drug use: No   Sexual activity: Not on file  Other Topics Concern   Not on file  Social History Narrative   Not on file   Social Determinants of Health   Financial Resource Strain: Not on file  Food Insecurity: No Food Insecurity (04/06/2023)   Hunger Vital Sign    Worried About Running Out of Food in the Last Year: Never true    Ran Out of Food in the Last Year: Never true  Transportation Needs: No Transportation Needs (04/06/2023)   PRAPARE - Administrator, Civil Service (Medical): No    Lack of Transportation (Non-Medical): No  Physical Activity: Not on file  Stress: Not on file  Social Connections: Not on file    Code Status: Full code  Review of Systems: A 12 point ROS discussed and pertinent positives are indicated in the HPI above.  All other systems are negative.  Review of Systems  Constitutional:  Positive for chills. Negative for fever.  Respiratory:  Negative for chest tightness and shortness of breath.   Cardiovascular:  Negative for chest pain and leg swelling.  Gastrointestinal:  Positive for nausea. Negative for abdominal pain, diarrhea and vomiting.  Neurological:  Positive for dizziness and headaches. Negative for light-headedness.  Psychiatric/Behavioral:  Negative for confusion.     Vital Signs: BP (!) 165/92 (BP Location: Right Arm)   Pulse 67   Temp 98.9 F (37.2 C) (Oral)   Resp 16   Ht 6\' 3"  (1.905 m)   Wt 255 lb (115.7 kg)   SpO2 98%   BMI 31.87 kg/m     Physical Exam Vitals reviewed.  Constitutional:      General: He is not in acute distress.    Appearance: He is not ill-appearing.  Cardiovascular:     Rate and Rhythm: Normal rate and regular  rhythm.     Pulses: Normal pulses.     Heart sounds: Normal heart sounds.  Pulmonary:     Effort: Pulmonary effort is normal.     Breath sounds: Normal breath sounds.  Abdominal:     Palpations: Abdomen is soft.     Tenderness: There is no abdominal tenderness.  Musculoskeletal:     Right lower leg: No edema.     Left lower leg: No edema.  Skin:    General: Skin is warm  and dry.  Neurological:     Mental Status: He is alert and oriented to person, place, and time.     Cranial Nerves: No cranial nerve deficit.     Motor: No weakness.  Psychiatric:        Mood and Affect: Mood normal.        Behavior: Behavior normal.        Thought Content: Thought content normal.        Judgment: Judgment normal.     Imaging: CT ANGIO HEAD NECK W WO CM  Result Date: 04/05/2023 CLINICAL DATA:  Abnormal balance. Abnormal CT head. Pontine infarct. EXAM: CT ANGIOGRAPHY HEAD AND NECK WITH AND WITHOUT CONTRAST TECHNIQUE: Multidetector CT imaging of the head and neck was performed using the standard protocol during bolus administration of intravenous contrast. Multiplanar CT image reconstructions and MIPs were obtained to evaluate the vascular anatomy. Carotid stenosis measurements (when applicable) are obtained utilizing NASCET criteria, using the distal internal carotid diameter as the denominator. RADIATION DOSE REDUCTION: This exam was performed according to the departmental dose-optimization program which includes automated exposure control, adjustment of the mA and/or kV according to patient size and/or use of iterative reconstruction technique. CONTRAST:  60mL OMNIPAQUE IOHEXOL 300 MG/ML  SOLN COMPARISON:  None Available. FINDINGS: CTA NECK FINDINGS Aortic arch: A 3 vessel arch configuration is present. Minimal calcification present in the distal arch. No significant stenosis or aneurysm is present. Right carotid system: The right common carotid artery is within normal limits. The bifurcation is  unremarkable. The cervical right ICA is normal. Left carotid system: The left common carotid artery is within normal limits. Minimal calcifications present bifurcation without significant stenosis. The cervical left ICA is normal. Vertebral arteries: The left vertebral artery is the dominant vessel. Both vertebral arteries originate from the vertebral arteries without significant stenosis. No significant stenosis is present in either vertebral artery in the neck. Skeleton: The vertebral body heights and alignment are normal. No focal osseous lesions are present. Other neck: A 1.8 cm hypodense nodule is present within the right lobe of the thyroid. No other thyroid lesions are present. The submandibular and parotid glands and ducts are within normal limits. No significant adenopathy is present. Upper chest: The lung apices are clear. The thoracic inlet is within normal limits. Review of the MIP images confirms the above findings CTA HEAD FINDINGS Anterior circulation: Minimal calcification is present in the cavernous internal carotid arteries bilaterally. No significant stenosis is present through the ICA termini. The A1 and M1 segments are normal. The anterior communicating artery is patent. MCA bifurcations within normal limits bilaterally. The ACA and MCA branch vessels are normal. Posterior circulation: The left vertebral artery is the dominant vessel. The right vertebral artery is centrally terminates at the PICA. Left PICA origin is visualized and normal. A high-grade stenosis is present in the proximal basilar artery. The more distal basilar artery is small. Both posterior scratched at both superior cerebellar arteries are patent. The left posterior cerebral artery originates from basilar tip. The right PCA is of fetal type. The PCA branch vessels are within normal limits bilaterally. Venous sinuses: The dural sinuses are patent. The straight sinus and deep cerebral veins are intact. Cortical veins are within  normal limits. No significant vascular malformation is evident. Anatomic variants: Fetal type right posterior cerebral artery. Review of the MIP images confirms the above findings IMPRESSION: 1. 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 This likely contributes to the brainstem infarct. 2. The more distal  basilar artery is small. 3. Fetal type right posterior cerebral artery. 4. 1.8 cm hypodense nodule within the right lobe of the thyroid. Recommend non-emergent thyroid ultrasound. Reference: J Am Coll Radiol. 2015 Feb;12(2): 143-50 Electronically Signed   By: Marin Roberts M.D.   On: 04/05/2023 18:45   MR BRAIN W WO CONTRAST  Result Date: 04/05/2023 CLINICAL DATA:  Patient feeling off balance.  Abnormal head CT. EXAM: MRI HEAD WITHOUT AND WITH CONTRAST TECHNIQUE: Multiplanar, multiecho pulse sequences of the brain and surrounding structures were obtained without and with intravenous contrast. CONTRAST:  10mL GADAVIST GADOBUTROL 1 MMOL/ML IV SOLN COMPARISON:  CT head without contrast 04/05/2023 FINDINGS: Brain: The diffusion-weighted images demonstrate acute/subacute nonhemorrhagic right pontine infarct. The left paramedian pontine infarct is remote. A remote nonhemorrhagic infarct is present in the right lentiform nucleus. No additional infarcts are present. No significant white matter lesions are present. Deep brain nuclei are within normal limits. The ventricles are of normal size. A remote lacunar infarct is present in the lateral left cerebellum. Vascular: Flow is present in the major intracranial arteries. Skull and upper cervical spine: The craniocervical junction is normal. Upper cervical spine is within normal limits. Marrow signal is unremarkable. Sinuses/Orbits: The paranasal sinuses and mastoid air cells are clear. The globes and orbits are within normal limits. IMPRESSION: 1. Acute/subacute nonhemorrhagic right pontine infarct. 2. Remote left paramedian pontine infarct. 3. Remote  nonhemorrhagic infarct of the right lentiform nucleus. 4. Remote lacunar infarct of the lateral left cerebellum. These results were called by telephone at the time of interpretation on 04/05/2023 at 6:35 pm to provider KEN LE , who verbally acknowledged these results. Electronically Signed   By: Marin Roberts M.D.   On: 04/05/2023 18:35   CT Head Wo Contrast  Result Date: 04/05/2023 CLINICAL DATA:  Headache, increasing frequency or severity EXAM: CT HEAD WITHOUT CONTRAST TECHNIQUE: Contiguous axial images were obtained from the base of the skull through the vertex without intravenous contrast. RADIATION DOSE REDUCTION: This exam was performed according to the departmental dose-optimization program which includes automated exposure control, adjustment of the mA and/or kV according to patient size and/or use of iterative reconstruction technique. COMPARISON:  None Available. FINDINGS: Brain: No evidence of acute infarction, hemorrhage, hydrocephalus, extra-axial collection or mass lesion/mass effect. There is an age indeterminate, but likely chronic infarct in the central pons. Vascular: No hyperdense vessel or unexpected calcification. Skull: Normal. Negative for fracture or focal lesion. Sinuses/Orbits: No middle ear mastoid effusion. Paranasal sinuses are clear. Orbits are unremarkable. Other: None. IMPRESSION: 1. Age indeterminate, but likely chronic infarct in the central pons. If there is clinical concern for acute infarct, consider MRI for further evaluation. 2. Otherwise no specific CT etiology for headaches identified Electronically Signed   By: Lorenza Cambridge M.D.   On: 04/05/2023 13:38    Labs:  CBC: Recent Labs    12/19/22 0945 04/05/23 1201 04/06/23 0100  WBC 6.8 7.4 7.1  HGB 15.4 14.8 14.1  HCT 45.4 42.7 40.6  PLT 179.0 173 155    COAGS: No results for input(s): "INR", "APTT" in the last 8760 hours.  BMP: Recent Labs    12/19/22 0945 04/05/23 1201 04/06/23 0100  NA 139 135  140  K 4.2 3.7 3.0*  CL 101 102 104  CO2 28 19* 24  GLUCOSE 187* 299* 142*  BUN 21 14 13   CALCIUM 9.8 8.8* 8.7*  CREATININE 1.42 1.63* 1.32*  GFRNONAA  --  49* >60    LIVER FUNCTION  TESTS: Recent Labs    12/19/22 0945 04/05/23 1201  BILITOT 1.0 1.2  AST 32 32  ALT 42 32  ALKPHOS 74 74  PROT 7.2 6.7  ALBUMIN 4.5  4.5 3.9    TUMOR MARKERS: No results for input(s): "AFPTM", "CEA", "CA199", "CHROMGRNA" in the last 8760 hours.  Assessment and Plan:  Charles Phelps is a 57 yo male being seen today in relation to acute CVA with basilar artery stenosis. Patient has been referred to [facility] service for evaluation for diagnostic angiogram with possible basilar artery stenting. Case reviewed by Dr Tommi Rumps Melchor Amour and approved for the above procedure. Procedure tentatively planned for 04/09/23 with general anesthesia. Patient to have loading dose of Brilinta 180 mg with 81 mg aspirin the evening before the procedure. Patient to be NPO at midnight prior to procedure.   Risks and benefits of cerebral angiogram with intervention were discussed with the patient including, but not limited to bleeding, infection, vascular injury, contrast induced renal failure, stroke or even death.  This interventional procedure involves the use of X-rays and because of the nature of the planned procedure, it is possible that we will have prolonged use of X-ray fluoroscopy.  Potential radiation risks to you include (but are not limited to) the following: - A slightly elevated risk for cancer  several years later in life. This risk is typically less than 0.5% percent. This risk is low in comparison to the normal incidence of human cancer, which is 33% for women and 50% for men according to the [employer] Society. - Radiation induced injury can include skin redness, resembling a rash, tissue breakdown / ulcers and hair loss (which can be temporary or permanent).   The likelihood of either of these  occurring depends on the difficulty of the procedure and whether you are sensitive to radiation due to previous procedures, disease, or genetic conditions.   IF your procedure requires a prolonged use of radiation, you will be notified and given written instructions for further action.  It is your responsibility to monitor the irradiated area for the 2 weeks following the procedure and to notify your physician if you are concerned that you have suffered a radiation induced injury.    All of the patient's questions were answered, patient is agreeable to proceed.  Consent signed and in chart.     Thank you for this interesting consult.  I greatly enjoyed meeting Charles Phelps and look forward to participating in their care.  A copy of this report was sent to the requesting provider on this date.  Electronically Signed: Kennieth Francois, PA-C 04/06/2023, 2:06 PM   I spent a total of 40 Minutes in face to face in clinical consultation, greater than 50% of which was counseling/coordinating care for basilar artery stenosis.

## 2023-04-06 NOTE — Progress Notes (Signed)
Orders received for swallow evaluation however per RN, patient passed the [facility] swallow screen and is tolerating diet well. Will defer formal evaluation and f/u for cognitive-linguistic evaluation only.   Annabel Gibeau MA, CCC-SLP

## 2023-04-06 NOTE — Evaluation (Signed)
Speech Language Pathology Evaluation Patient Details Name: Charles Phelps MRN: [id number] DOB: [birth year] Today's Date: 04/06/2023 Time: 9629-5284 SLP Time Calculation (min) (ACUTE ONLY): 13 min  Problem List:  Patient Active Problem List   Diagnosis Date Noted   Acute CVA (cerebrovascular accident) (HCC) 04/05/2023   Unilateral primary osteoarthritis, left hip 07/04/2022   Statin intolerance 12/12/2021   Vitamin B12 deficiency 12/12/2021   History of colonic polyps 12/06/2021   Chronic diarrhea 05/30/2020   Chronic anticoagulation 05/30/2020   Tendonitis, Achilles, left 03/23/2019   Gout    Moderate obesity 10/01/2017   Paroxysmal atrial fibrillation (HCC) 01/30/2017   OSA (obstructive sleep apnea) 08/29/2014   Type 2 diabetes mellitus with other circulatory complications (HCC) 02/07/2011   Preventative health care 02/01/2011   IBS 02/27/2009   Essential hypertension, benign 01/27/2009   OSTEOARTHRITIS 02/21/2007   Hyperlipemia 02/17/2007   Mood disorder (HCC) 02/17/2007   Allergic rhinitis 02/17/2007   GERD 02/17/2007   ARTHRITIS, LEFT HIP 02/17/2007   Past Medical History:  Past Medical History:  Diagnosis Date   Allergic rhinitis    Anxiety    Arthritis    Atrial fibrillation (HCC)    Diabetes (HCC)    Elevated LFTs 2000   fatty liver   GERD (gastroesophageal reflux disease)    Glucose intolerance (impaired glucose tolerance)    Gout    Hyperlipidemia    Hypertension    IBS (irritable bowel syndrome)    Obstructive sleep apnea    Osteoarthritis    Past Surgical History:  Past Surgical History:  Procedure Laterality Date   NO PAST SURGERIES     HPI:  57 year old male admitted with acute pontine CVA.   Assessment / Plan / Recommendation Clinical Impression  Cognitive-linguistic evaluation complete with patient scoring [facility] for all areas assessed. No f/u SLP services indicated at this time.    SLP Assessment  SLP Recommendation/Assessment: Patient  does not need any further Speech Lanaguage Pathology Services SLP Visit Diagnosis: Cognitive communication deficit (R41.841)    Recommendations for follow up therapy are one component of a multi-disciplinary discharge planning process, led by the attending physician.  Recommendations may be updated based on patient status, additional functional criteria and insurance authorization.    Follow Up Recommendations  No SLP follow up    Assistance Recommended at Discharge  None  Functional Status Assessment Patient has not had a recent decline in their functional status        SLP Evaluation Cognition  Overall Cognitive Status: Within Functional Limits for tasks assessed Orientation Level: Oriented X4       Comprehension  Auditory Comprehension Overall Auditory Comprehension: Appears within functional limits for tasks assessed Visual Recognition/Discrimination Discrimination: Within Function Limits Reading Comprehension Reading Status: Within funtional limits    Expression Expression Primary Mode of Expression: Verbal Verbal Expression Overall Verbal Expression: Appears within functional limits for tasks assessed   Oral / Motor  Oral Motor/Sensory Function Overall Oral Motor/Sensory Function: Within functional limits Motor Speech Overall Motor Speech: Appears within functional limits for tasks assessed           Ferdinand Lango MA, CCC-SLP  Natara Monfort Meryl 04/06/2023, 2:04 PM

## 2023-04-06 NOTE — ED Notes (Signed)
ED TO INPATIENT HANDOFF REPORT  ED Nurse Name and Phone #: Porfirio Mylar [phone]  S Name/Age/Gender Charles Phelps 57 y.o. male Room/Bed: 009C/009C  Code Status   Code Status: Full Code  Home/SNF/Other Home Patient oriented to: self, place, time, and situation Is this baseline? Yes   Triage Complete: Triage complete  Chief Complaint Acute CVA (cerebrovascular accident) [facility]) [I63.9]  Triage Note Pt came in via POV d/t feeling uncoordinated with a HA & some nausea for the past 2-3 days. Reports this has happened in the past before a couple of times for several days. Denies the feeling of dizziness just feel like he fumbles with his feet. A/Ox4, denies pain while in triage.    Allergies Allergies  Allergen Reactions   Atorvastatin Other (See Comments)    Memory problems    Level of Care/Admitting Diagnosis ED Disposition     ED Disposition  Admit   Condition  --   Comment  Hospital Area: [facility] [[id number]]  Level of Care: Progressive [102]  Admit to Progressive based on following criteria: NEUROLOGICAL AND NEUROSURGICAL complex patients with significant risk of instability, who do not meet ICU criteria, yet require close observation or frequent assessment (< / = every 2 - 4 hours) with medical / nursing intervention.  May admit patient to [facility] or [facility] if equivalent level of care is available:: No  Covid Evaluation: Asymptomatic - no recent exposure (last 10 days) testing not required  Diagnosis: Acute CVA (cerebrovascular accident) [facility]) [[id number]]  Admitting Physician: Buena Irish [[id number]]  Attending Physician: Buena Irish [phone]  Certification:: I certify this patient will need inpatient services for at least 2 midnights  Expected Medical Readiness: 04/07/2023          B Medical/Surgery History Past Medical History:  Diagnosis Date   Allergic rhinitis    Anxiety    Arthritis    Atrial fibrillation (HCC)     Diabetes (HCC)    Elevated LFTs 2000   fatty liver   GERD (gastroesophageal reflux disease)    Glucose intolerance (impaired glucose tolerance)    Gout    Hyperlipidemia    Hypertension    IBS (irritable bowel syndrome)    Obstructive sleep apnea    Osteoarthritis    Past Surgical History:  Procedure Laterality Date   NO PAST SURGERIES       A IV Location/Drains/Wounds Patient Lines/Drains/Airways Status     Active Line/Drains/Airways     Name Placement date Placement time Site Days   Peripheral IV 04/05/23 20 G Anterior;Distal;Left;Upper Arm 04/05/23  1423  Arm  1            Intake/Output Last 24 hours  Intake/Output Summary (Last 24 hours) at 04/06/2023 1052 Last data filed at 04/06/2023 0347 Gross per 24 hour  Intake 1000 ml  Output --  Net 1000 ml    Labs/Imaging Results for orders placed or performed during the hospital encounter of 04/05/23 (from the past 48 hour(s))  Urinalysis, Routine w reflex microscopic -Urine, Clean Catch     Status: Abnormal   Collection Time: 04/05/23 11:57 AM  Result Value Ref Range   Color, Urine YELLOW YELLOW   APPearance CLEAR CLEAR   Specific Gravity, Urine 1.030 1.005 - 1.030   pH 6.0 5.0 - 8.0   Glucose, UA >=500 (A) NEGATIVE mg/dL   Hgb urine dipstick NEGATIVE NEGATIVE   Bilirubin Urine NEGATIVE NEGATIVE   Ketones, ur 5 (A) NEGATIVE mg/dL  Protein, ur NEGATIVE NEGATIVE mg/dL   Nitrite NEGATIVE NEGATIVE   Leukocytes,Ua NEGATIVE NEGATIVE   RBC / HPF 0-5 0 - 5 RBC/hpf   WBC, UA 0-5 0 - 5 WBC/hpf   Bacteria, UA NONE SEEN NONE SEEN   Squamous Epithelial / HPF 0-5 0 - 5 /HPF    Comment: Performed at [facility], [address] [place], [place] [address]  Comprehensive metabolic panel     Status: Abnormal   Collection Time: 04/05/23 12:01 PM  Result Value Ref Range   Sodium 135 135 - 145 mmol/L   Potassium 3.7 3.5 - 5.1 mmol/L   Chloride 102 98 - 111 mmol/L   CO2 19 (L) 22 - 32 mmol/L   Glucose, Bld 299 (H) 70  - 99 mg/dL    Comment: Glucose reference range applies only to samples taken after fasting for at least 8 hours.   BUN 14 6 - 20 mg/dL   Creatinine, Ser 6.57 (H) 0.61 - 1.24 mg/dL   Calcium 8.8 (L) 8.9 - 10.3 mg/dL   Total Protein 6.7 6.5 - 8.1 g/dL   Albumin 3.9 3.5 - 5.0 g/dL   AST 32 15 - 41 U/L   ALT 32 0 - 44 U/L   Alkaline Phosphatase 74 38 - 126 U/L   Total Bilirubin 1.2 0.3 - 1.2 mg/dL   GFR, Estimated 49 (L) >60 mL/min    Comment: (NOTE) Calculated using the CKD-EPI Creatinine Equation (2021)    Anion gap 14 5 - 15    Comment: Performed at [facility], [address] [place], [place] [address]  CBC     Status: None   Collection Time: 04/05/23 12:01 PM  Result Value Ref Range   WBC 7.4 4.0 - 10.5 K/uL   RBC 5.16 4.22 - 5.81 MIL/uL   Hemoglobin 14.8 13.0 - 17.0 g/dL   HCT 29.5 28.4 - 13.2 %   MCV 82.8 80.0 - 100.0 fL   MCH 28.7 26.0 - 34.0 pg   MCHC 34.7 30.0 - 36.0 g/dL   RDW 44.0 10.2 - 72.5 %   Platelets 173 150 - 400 K/uL   nRBC 0.0 0.0 - 0.2 %    Comment: Performed at [facility], [address] [place], [place] [address]  SARS Coronavirus 2 by RT PCR (hospital order, performed in [facility] hospital lab) *cepheid single result test* Anterior Nasal Swab     Status: None   Collection Time: 04/05/23 12:16 PM   Specimen: Anterior Nasal Swab  Result Value Ref Range   SARS Coronavirus 2 by RT PCR NEGATIVE NEGATIVE    Comment: Performed at [facility], [address], [place], [place] [address]  HIV Antibody (routine testing w rflx)     Status: None   Collection Time: 04/06/23  1:00 AM  Result Value Ref Range   HIV Screen 4th Generation wRfx Non Reactive Non Reactive    Comment: Performed at [facility], [address], [place], [place] [address]  Magnesium     Status: None   Collection Time: 04/06/23  1:00 AM  Result Value Ref Range   Magnesium 1.8 1.7 - 2.4 mg/dL    Comment: Performed at [facility], [address],  [place], [place] [address]  TSH     Status: None   Collection Time: 04/06/23  1:00 AM  Result Value Ref Range   TSH 2.157 0.350 - 4.500 uIU/mL  Comment: Performed by a 3rd Generation assay with a functional sensitivity of <=0.01 uIU/mL. Performed at [facility], [address], [place], [place] [address]   Basic metabolic panel     Status: Abnormal   Collection Time: 04/06/23  1:00 AM  Result Value Ref Range   Sodium 140 135 - 145 mmol/L   Potassium 3.0 (L) 3.5 - 5.1 mmol/L   Chloride 104 98 - 111 mmol/L   CO2 24 22 - 32 mmol/L   Glucose, Bld 142 (H) 70 - 99 mg/dL    Comment: Glucose reference range applies only to samples taken after fasting for at least 8 hours.   BUN 13 6 - 20 mg/dL   Creatinine, Ser 1.91 (H) 0.61 - 1.24 mg/dL   Calcium 8.7 (L) 8.9 - 10.3 mg/dL   GFR, Estimated >47 >82 mL/min    Comment: (NOTE) Calculated using the CKD-EPI Creatinine Equation (2021)    Anion gap 12 5 - 15    Comment: Performed at [facility], [address] [place], [place] [address]  CBC     Status: None   Collection Time: 04/06/23  1:00 AM  Result Value Ref Range   WBC 7.1 4.0 - 10.5 K/uL   RBC 4.76 4.22 - 5.81 MIL/uL   Hemoglobin 14.1 13.0 - 17.0 g/dL   HCT 30.8 65.7 - 84.6 %   MCV 85.3 80.0 - 100.0 fL   MCH 29.6 26.0 - 34.0 pg   MCHC 34.7 30.0 - 36.0 g/dL   RDW 96.2 95.2 - 84.1 %   Platelets 155 150 - 400 K/uL   nRBC 0.0 0.0 - 0.2 %    Comment: Performed at [facility], [address] [place], [place] [address]  Hemoglobin A1c     Status: Abnormal   Collection Time: 04/06/23  1:00 AM  Result Value Ref Range   Hgb A1c MFr Bld 8.0 (H) 4.8 - 5.6 %    Comment: (NOTE) Pre diabetes:          5.7%-6.4%  Diabetes:              >6.4%  Glycemic control for   <7.0% adults with diabetes    Mean Plasma Glucose 182.9 mg/dL    Comment: Performed at [facility], [address], [place], [place] [address]  CBG monitoring, ED     Status: Abnormal   Collection  Time: 04/06/23  8:42 AM  Result Value Ref Range   Glucose-Capillary 205 (H) 70 - 99 mg/dL    Comment: Glucose reference range applies only to samples taken after fasting for at least 8 hours.   CT ANGIO HEAD NECK W WO CM  Result Date: 04/05/2023 CLINICAL DATA:  Abnormal balance. Abnormal CT head. Pontine infarct. EXAM: CT ANGIOGRAPHY HEAD AND NECK WITH AND WITHOUT CONTRAST TECHNIQUE: Multidetector CT imaging of the head and neck was performed using the standard protocol during bolus administration of intravenous contrast. Multiplanar CT image reconstructions and MIPs were obtained to evaluate the vascular anatomy. Carotid stenosis measurements (when applicable) are obtained utilizing NASCET criteria, using the distal internal carotid diameter as the denominator. RADIATION DOSE REDUCTION: This exam was performed according to the departmental dose-optimization program which includes automated exposure control, adjustment of the mA and/or kV according to patient size and/or use of iterative reconstruction technique. CONTRAST:  60mL OMNIPAQUE IOHEXOL 300 MG/ML  SOLN COMPARISON:  None Available. FINDINGS: CTA NECK FINDINGS Aortic arch: A 3 vessel arch  configuration is present. Minimal calcification present in the distal arch. No significant stenosis or aneurysm is present. Right carotid system: The right common carotid artery is within normal limits. The bifurcation is unremarkable. The cervical right ICA is normal. Left carotid system: The left common carotid artery is within normal limits. Minimal calcifications present bifurcation without significant stenosis. The cervical left ICA is normal. Vertebral arteries: The left vertebral artery is the dominant vessel. Both vertebral arteries originate from the vertebral arteries without significant stenosis. No significant stenosis is present in either vertebral artery in the neck. Skeleton: The vertebral body heights and alignment are normal. No focal osseous lesions  are present. Other neck: A 1.8 cm hypodense nodule is present within the right lobe of the thyroid. No other thyroid lesions are present. The submandibular and parotid glands and ducts are within normal limits. No significant adenopathy is present. Upper chest: The lung apices are clear. The thoracic inlet is within normal limits. Review of the MIP images confirms the above findings CTA HEAD FINDINGS Anterior circulation: Minimal calcification is present in the cavernous internal carotid arteries bilaterally. No significant stenosis is present through the ICA termini. The A1 and M1 segments are normal. The anterior communicating artery is patent. MCA bifurcations within normal limits bilaterally. The ACA and MCA branch vessels are normal. Posterior circulation: The left vertebral artery is the dominant vessel. The right vertebral artery is centrally terminates at the PICA. Left PICA origin is visualized and normal. A high-grade stenosis is present in the proximal basilar artery. The more distal basilar artery is small. Both posterior scratched at both superior cerebellar arteries are patent. The left posterior cerebral artery originates from basilar tip. The right PCA is of fetal type. The PCA branch vessels are within normal limits bilaterally. Venous sinuses: The dural sinuses are patent. The straight sinus and deep cerebral veins are intact. Cortical veins are within normal limits. No significant vascular malformation is evident. Anatomic variants: Fetal type right posterior cerebral artery. Review of the MIP images confirms the above findings IMPRESSION: 1. 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 This likely contributes to the brainstem infarct. 2. The more distal basilar artery is small. 3. Fetal type right posterior cerebral artery. 4. 1.8 cm hypodense nodule within the right lobe of the thyroid. Recommend non-emergent thyroid ultrasound. Reference: J Am Coll Radiol. 2015 Feb;12(2): 143-50  Electronically Signed   By: Marin Roberts M.D.   On: 04/05/2023 18:45   MR BRAIN W WO CONTRAST  Result Date: 04/05/2023 CLINICAL DATA:  Patient feeling off balance.  Abnormal head CT. EXAM: MRI HEAD WITHOUT AND WITH CONTRAST TECHNIQUE: Multiplanar, multiecho pulse sequences of the brain and surrounding structures were obtained without and with intravenous contrast. CONTRAST:  10mL GADAVIST GADOBUTROL 1 MMOL/ML IV SOLN COMPARISON:  CT head without contrast 04/05/2023 FINDINGS: Brain: The diffusion-weighted images demonstrate acute/subacute nonhemorrhagic right pontine infarct. The left paramedian pontine infarct is remote. A remote nonhemorrhagic infarct is present in the right lentiform nucleus. No additional infarcts are present. No significant white matter lesions are present. Deep brain nuclei are within normal limits. The ventricles are of normal size. A remote lacunar infarct is present in the lateral left cerebellum. Vascular: Flow is present in the major intracranial arteries. Skull and upper cervical spine: The craniocervical junction is normal. Upper cervical spine is within normal limits. Marrow signal is unremarkable. Sinuses/Orbits: The paranasal sinuses and mastoid air cells are clear. The globes and orbits are within normal limits. IMPRESSION: 1. Acute/subacute nonhemorrhagic  right pontine infarct. 2. Remote left paramedian pontine infarct. 3. Remote nonhemorrhagic infarct of the right lentiform nucleus. 4. Remote lacunar infarct of the lateral left cerebellum. These results were called by telephone at the time of interpretation on 04/05/2023 at 6:35 pm to provider KEN LE , who verbally acknowledged these results. Electronically Signed   By: Marin Roberts M.D.   On: 04/05/2023 18:35   CT Head Wo Contrast  Result Date: 04/05/2023 CLINICAL DATA:  Headache, increasing frequency or severity EXAM: CT HEAD WITHOUT CONTRAST TECHNIQUE: Contiguous axial images were obtained from the base of  the skull through the vertex without intravenous contrast. RADIATION DOSE REDUCTION: This exam was performed according to the departmental dose-optimization program which includes automated exposure control, adjustment of the mA and/or kV according to patient size and/or use of iterative reconstruction technique. COMPARISON:  None Available. FINDINGS: Brain: No evidence of acute infarction, hemorrhage, hydrocephalus, extra-axial collection or mass lesion/mass effect. There is an age indeterminate, but likely chronic infarct in the central pons. Vascular: No hyperdense vessel or unexpected calcification. Skull: Normal. Negative for fracture or focal lesion. Sinuses/Orbits: No middle ear mastoid effusion. Paranasal sinuses are clear. Orbits are unremarkable. Other: None. IMPRESSION: 1. Age indeterminate, but likely chronic infarct in the central pons. If there is clinical concern for acute infarct, consider MRI for further evaluation. 2. Otherwise no specific CT etiology for headaches identified Electronically Signed   By: Lorenza Cambridge M.D.   On: 04/05/2023 13:38    Pending Labs Unresulted Labs (From admission, onward)     Start     Ordered   04/07/23 0500  Lipid panel  Tomorrow morning,   R        04/06/23 0833            Vitals/Pain Today's Vitals   04/06/23 0845 04/06/23 0900 04/06/23 1000 04/06/23 1045  BP:  (!) 175/81 (!) 162/85   Pulse:  64 70   Resp:  14 16   Temp: 97.7 F (36.5 C)     TempSrc: Oral     SpO2:  100% 100%   Weight:      Height:      PainSc:    0-No pain    Isolation Precautions Airborne and Contact precautions  Medications Medications  acetaminophen (TYLENOL) tablet 650 mg (has no administration in time range)    Or  acetaminophen (TYLENOL) suppository 650 mg (has no administration in time range)  ondansetron (ZOFRAN) tablet 4 mg (has no administration in time range)    Or  ondansetron (ZOFRAN) injection 4 mg (has no administration in time range)   sorbitol 70 % solution 30 mL (has no administration in time range)  lactated ringers infusion ( Intravenous Stopped 04/06/23 0801)  diltiazem (CARDIZEM CD) 24 hr capsule 120 mg (120 mg Oral Given 04/06/23 0117)  lisinopril (ZESTRIL) tablet 20 mg (20 mg Oral Given 04/06/23 0956)  citalopram (CELEXA) tablet 20 mg (20 mg Oral Given 04/06/23 0955)  pantoprazole (PROTONIX) EC tablet 40 mg (40 mg Oral Given 04/06/23 0956)  cyanocobalamin (VITAMIN B12) tablet 1,000 mcg (1,000 mcg Oral Given 04/05/23 2212)  insulin aspart (novoLOG) injection 0-6 Units (2 Units Subcutaneous Patient Refused/Not Given 04/06/23 0954)  gadobutrol (GADAVIST) 1 MMOL/ML injection 10 mL (10 mLs Intravenous Contrast Given 04/05/23 1545)  iohexol (OMNIPAQUE) 300 MG/ML solution 60 mL (60 mLs Intravenous Contrast Given 04/05/23 1632)    Mobility walks     Focused Assessments Cardiac Assessment Handoff:  Cardiac Rhythm: Normal sinus rhythm  No results found for: "CKTOTAL", "CKMB", "CKMBINDEX", "TROPONINI" No results found for: "DDIMER" Does the Patient currently have chest pain? No   , Neuro Assessment Handoff:  Swallow screen pass? Yes  Cardiac Rhythm: Normal sinus rhythm NIH Stroke Scale  Dizziness Present: No Headache Present: No Interval: Shift assessment Level of Consciousness (1a.)   : Alert, keenly responsive LOC Questions (1b. )   : Answers both questions correctly LOC Commands (1c. )   : Performs both tasks correctly Best Gaze (2. )  : Normal Visual (3. )  : No visual loss Facial Palsy (4. )    : Normal symmetrical movements Motor Arm, Left (5a. )   : No drift Motor Arm, Right (5b. ) : No drift Motor Leg, Left (6a. )  : No drift Motor Leg, Right (6b. ) : No drift Limb Ataxia (7. ): Absent Sensory (8. )  : Normal, no sensory loss Best Language (9. )  : No aphasia Dysarthria (10. ): Normal Extinction/Inattention (11.)   : No Abnormality Complete NIHSS TOTAL: 0     Neuro Assessment: Within Defined Limits Neuro  Checks:   Shift assessment (04/05/23 2125)  Has TPA been given? No If patient is a Neuro Trauma and patient is going to OR before floor call report to 4N Charge nurse: [phone] or [phone]   R Recommendations: See Admitting Provider Note  Report given to:   Additional Notes:   PT a&o x4, breathing e/u, no obvious neuro deficits at this time. Denies any complaints.

## 2023-04-06 NOTE — Progress Notes (Signed)
   04/06/23 2210  BiPAP/CPAP/SIPAP  BiPAP/CPAP/SIPAP Pt Type Adult  Patient Home Equipment Yes  Safety Check Completed by RT for Home Unit Yes, issues noted   Patient has home CPAP at bedside and stated he would place on himself when ready and that he didn't need any assistance from RT.

## 2023-04-07 ENCOUNTER — Other Ambulatory Visit (HOSPITAL_COMMUNITY): Payer: BC Managed Care – PPO

## 2023-04-07 ENCOUNTER — Inpatient Hospital Stay (HOSPITAL_COMMUNITY): Payer: BC Managed Care – PPO

## 2023-04-07 DIAGNOSIS — I6389 Other cerebral infarction: Secondary | ICD-10-CM

## 2023-04-07 DIAGNOSIS — I639 Cerebral infarction, unspecified: Secondary | ICD-10-CM | POA: Diagnosis not present

## 2023-04-07 LAB — ECHOCARDIOGRAM COMPLETE
AR max vel: 3.55 cm2
AV Area VTI: 3.26 cm2
AV Area mean vel: 3.32 cm2
AV Mean grad: 3 mmHg
AV Peak grad: 6.2 mmHg
Ao pk vel: 1.24 m/s
Area-P 1/2: 2.46 cm2
Height: 75 in
S' Lateral: 2.9 cm
Weight: 4080 [oz_av]

## 2023-04-07 LAB — LIPID PANEL
Cholesterol: 201 mg/dL — ABNORMAL HIGH (ref 0–200)
HDL: 33 mg/dL — ABNORMAL LOW (ref >40)
LDL Cholesterol: 97 mg/dL (ref 0–99)
Total CHOL/HDL Ratio: 6.1 ratio
Triglycerides: 356 mg/dL — ABNORMAL HIGH (ref <150)
VLDL: 71 mg/dL — ABNORMAL HIGH (ref 0–40)

## 2023-04-07 LAB — HEPARIN LEVEL (UNFRACTIONATED): Heparin Unfractionated: 0.84 [IU]/mL — ABNORMAL HIGH (ref 0.30–0.70)

## 2023-04-07 LAB — COMPREHENSIVE METABOLIC PANEL
ALT: 30 U/L (ref 0–44)
AST: 24 U/L (ref 15–41)
Albumin: 3.6 g/dL (ref 3.5–5.0)
Alkaline Phosphatase: 61 U/L (ref 38–126)
Anion gap: 9 (ref 5–15)
BUN: 15 mg/dL (ref 6–20)
CO2: 27 mmol/L (ref 22–32)
Calcium: 8.8 mg/dL — ABNORMAL LOW (ref 8.9–10.3)
Chloride: 103 mmol/L (ref 98–111)
Creatinine, Ser: 1.37 mg/dL — ABNORMAL HIGH (ref 0.61–1.24)
GFR, Estimated: 60 mL/min — ABNORMAL LOW (ref >60)
Glucose, Bld: 179 mg/dL — ABNORMAL HIGH (ref 70–99)
Potassium: 3.9 mmol/L (ref 3.5–5.1)
Sodium: 139 mmol/L (ref 135–145)
Total Bilirubin: 1.1 mg/dL (ref 0.3–1.2)
Total Protein: 6.2 g/dL — ABNORMAL LOW (ref 6.5–8.1)

## 2023-04-07 LAB — GLUCOSE, CAPILLARY
Glucose-Capillary: 151 mg/dL — ABNORMAL HIGH (ref 70–99)
Glucose-Capillary: 229 mg/dL — ABNORMAL HIGH (ref 70–99)
Glucose-Capillary: 166 mg/dL — ABNORMAL HIGH (ref 70–99)

## 2023-04-07 LAB — APTT
aPTT: 61 s — ABNORMAL HIGH (ref 24–36)
aPTT: 65 s — ABNORMAL HIGH (ref 24–36)

## 2023-04-07 LAB — MAGNESIUM: Magnesium: 2 mg/dL (ref 1.7–2.4)

## 2023-04-07 MED ORDER — TICAGRELOR 90 MG PO TABS: 180.0000 mg | ORAL_TABLET | Freq: Once | ORAL | Status: DC

## 2023-04-07 MED ORDER — ASPIRIN 81 MG PO TBEC: 81.0000 mg | DELAYED_RELEASE_TABLET | Freq: Once | ORAL | Status: DC

## 2023-04-07 MED ORDER — PERFLUTREN LIPID MICROSPHERE
1.0000 mL | INTRAVENOUS | Status: AC | PRN
  Administered 2023-04-07: 3 mL via INTRAVENOUS

## 2023-04-07 NOTE — TOC Initial Note (Signed)
Transition of Care [facility]) - Initial/Assessment Note    Patient Details  Name: Charles Phelps MRN: [id number] Date of Birth: [birth year]  [facility]) CM/SW Contact:    Kermit Balo, RN Phone Number: 04/07/2023, 12:52 PM  Clinical Narrative:                  Patient is from home with spouse. She works from home. Son also lives at home. Pt drives self but spouse can provide needed transportation. Pt manages his own medications and denies any issues.  Awaiting Angio on Wednesday. Current recommendations are for outpatient therapy. Pt prefers to attend at [facility]. Will arrange after procedure.  TOC following.   Expected Discharge Plan: OP Rehab Barriers to Discharge: Continued Medical Work up   Patient Goals and CMS Choice     Choice offered to / list presented to : Patient      Expected Discharge Plan and Services   Discharge Planning Services: CM Consult   Living arrangements for the past 2 months: Single Family Home                                      Prior Living Arrangements/Services Living arrangements for the past 2 months: Single Family Home Lives with:: Spouse, Adult Children Patient language and need for interpreter reviewed:: Yes Do you feel safe going back to the place where you live?: Yes          Current home services: DME (cane/ CPAP) Criminal Activity/Legal Involvement Pertinent to Current Situation/Hospitalization: No - Comment as needed  Activities of Daily Living Home Assistive Devices/Equipment: CBG Meter, Cane (specify quad or straight), Blood pressure cuff, Scales, Contact lenses, Eyeglasses, Brace (specify type), CPAP ADL Screening (condition at time of admission) Patient's cognitive ability adequate to safely complete daily activities?: Yes Is the patient deaf or have difficulty hearing?: No Does the patient have difficulty seeing, even when wearing glasses/contacts?: No Does the patient have difficulty  concentrating, remembering, or making decisions?: No Patient able to express need for assistance with ADLs?: Yes Does the patient have difficulty dressing or bathing?: No Independently performs ADLs?: Yes (appropriate for developmental age) Does the patient have difficulty walking or climbing stairs?: No Weakness of Legs: None Weakness of Arms/Hands: None  Permission Sought/Granted                  Emotional Assessment Appearance:: Appears stated age Attitude/Demeanor/Rapport: Engaged Affect (typically observed): Accepting Orientation: : Oriented to Self, Oriented to Place, Oriented to  Time, Oriented to Situation   Psych Involvement: No (comment)  Admission diagnosis:  Acute CVA (cerebrovascular accident) [facility]) [I63.9] Patient Active Problem List   Diagnosis Date Noted   Acute CVA (cerebrovascular accident) (HCC) 04/05/2023   Unilateral primary osteoarthritis, left hip 07/04/2022   Statin intolerance 12/12/2021   Vitamin B12 deficiency 12/12/2021   History of colonic polyps 12/06/2021   Chronic diarrhea 05/30/2020   Chronic anticoagulation 05/30/2020   Tendonitis, Achilles, left 03/23/2019   Gout    Moderate obesity 10/01/2017   Paroxysmal atrial fibrillation (HCC) 01/30/2017   OSA (obstructive sleep apnea) 08/29/2014   Type 2 diabetes mellitus with other circulatory complications (HCC) 02/07/2011   Preventative health care 02/01/2011   IBS 02/27/2009   Essential hypertension, benign 01/27/2009   OSTEOARTHRITIS 02/21/2007   Hyperlipemia 02/17/2007   Mood disorder (HCC) 02/17/2007   Allergic rhinitis 02/17/2007  GERD 02/17/2007   ARTHRITIS, LEFT HIP 02/17/2007   PCP:  [name], MD Pharmacy:   [facility] - [place], [place] - [address] [place] [address] Phone: [phone] Fax: [phone]  [facility]) - [place], [place] - [address] [facility] [address] [place] [address] [place]  [place] [address] Phone: [phone] Fax: [phone]  [facility] [phone] - [place], [place] - [address] [place] [address] Phone: [phone] Fax: [phone]  [facility] - [place], [place] - [address] [place] [address] Phone: [phone] Fax: [phone]     Social Determinants of Health (SDOH) Social History: SDOH Screenings   Food Insecurity: No Food Insecurity (04/06/2023)  Housing: Low Risk  (04/06/2023)  Transportation Needs: No Transportation Needs (04/06/2023)  Utilities: Not At Risk (04/06/2023)  Depression (PHQ2-9): Medium Risk (12/19/2022)  Tobacco Use: Low Risk  (04/05/2023)   SDOH Interventions:     Readmission Risk Interventions     No data to display

## 2023-04-07 NOTE — Progress Notes (Addendum)
STROKE TEAM PROGRESS NOTE   BRIEF HPI [name] is a 57 y.o. male with history of OSA (CPAP compliant), hypertension, hyperlipidemia, T2DM, and atrial fibrillation on Xarelto nightly who presents with a 5-day history of discoordination, ataxia and slight bilateral upper extremity weakness.  Yesterday, patient could not play golf as he typically would. Of note patient had 5 days of similar symptoms 6 to 7 weeks ago.  His wife had him come to the emergency department.  MRI showed acute/subacute nonhemorrhagic right pontine infarct.  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  Awaiting echo.  Will need cerebral angiogram to determine if he is a candidate for stenting/coiling.  Last Xarelto p.m. of 9/7 - will need to postpone procedure until Tuesday, September 10 at the earliest.  Currently in NSR.   SIGNIFICANT HOSPITAL EVENTS 9/7: Presented.  MRI: Acute pontine stroke. 9/8: DVT prophylaxis: Xarelto discontinued in favor of heparin with eye towards cerebral angiogram 9/11.    INTERIM HISTORY/SUBJECTIVE  No acute events overnight.  Patient is in neurological baseline.  Some saccadic dysmetria noticed on left> right, improved from yesterday.  All else within normal limits.  Patient understands that he will be evaluated for stent placement Wednesday by IR, likely to leave Thursday.  Notes continued ataxia on standing/walking.  Asked about previous statin use, he said that they had "disagreed" within in the past (rosuvastatin/atorvastatin).  Explained to patient purpose of statin, while we were starting him on simvastatin now, and the potential to switch to Repatha in outpatient setting.   OBJECTIVE  CBC    Component Value Date/Time   WBC 7.1 04/06/2023 0100   RBC 4.76 04/06/2023 0100   HGB 14.1 04/06/2023 0100   HCT 40.6 04/06/2023 0100   PLT 155 04/06/2023 0100   MCV 85.3 04/06/2023 0100   MCH 29.6  04/06/2023 0100   MCHC 34.7 04/06/2023 0100   RDW 13.3 04/06/2023 0100   LYMPHSABS 1.3 06/06/2016 0849   MONOABS 0.4 06/06/2016 0849   EOSABS 0.1 06/06/2016 0849   BASOSABS 0.1 06/06/2016 0849    BMET    Component Value Date/Time   NA 139 04/07/2023 0816   K 3.9 04/07/2023 0816   CL 103 04/07/2023 0816   CO2 27 04/07/2023 0816   GLUCOSE 179 (H) 04/07/2023 0816   BUN 15 04/07/2023 0816   CREATININE 1.37 (H) 04/07/2023 0816   CALCIUM 8.8 (L) 04/07/2023 0816   GFRNONAA 60 (L) 04/07/2023 0816    IMAGING past 24 hours No results found.  Vitals:   04/06/23 1955 04/06/23 2327 04/07/23 0328 04/07/23 0745  BP: (!) 173/91 (!) 179/71 (!) 142/58 (!) 167/86  Pulse: 65 60 (!) 58 (!) 57  Resp: 17 16 18 18   Temp: 98.7 F (37.1 C) 97.9 F (36.6 C) (!) 97.5 F (36.4 C) 97.6 F (36.4 C)  TempSrc: Oral Axillary Axillary Oral  SpO2: 99% 98% 98% 100%  Weight:      Height:         PHYSICAL EXAM General:  Alert, well-nourished, well-developed patient in no acute distress Psych:  Mood and affect appropriate for situation CV: Regular rate and rhythm on monitor Respiratory:  Regular, unlabored respirations on room air GI: Abdomen soft and nontender   NEURO:  Mental Status: AA&Ox3, patient is able to give clear and coherent history Speech/Language: speech is without dysarthria or aphasia.  Naming, repetition, fluency, and comprehension intact.  Cranial  Nerves:  II: PERRL. Visual fields full.  III, IV, VI: EOMI. Eyelids elevate symmetrically.  Saccadic dysmetria on horizontal gaze left greater than right, improved today. V: Sensation is intact to light touch and symmetrical to face.  VII: Smile symmetrical VIII: hearing intact to voice. IX, X: Palate elevates symmetrically. Phonation is normal.  MW:[id number] shrug 5/5. XII: tongue is midline without fasciculations. Motor: 5/5 strength to all muscle groups tested.  Tone: is normal and bulk is normal Sensation- Intact to light touch  bilaterally. Extinction absent to light touch to DSS.   Coordination: FTN intact bilaterally Gait: deferred   ASSESSMENT/PLAN  Acute pontine ischemic infarct in the setting of high-grade basilar artery stenosis.  He also has old left paramedian pontine infarct in the same vascular distribution  Etiology: Patient has high-grade stenosis in the proximal basilar artery.  This is the likely source of patient's repeated brainstem infarct.  Per IR note, plan for diagnostic angiogram with possible basilar artery stenting 9/11.  Loading dose of brillinta 180 mg and aspirin 81 mg evening before procedure.  We will need Eliquis and Brilinta afterwards for long-term anticoagulation therapy.  Code Stroke CT head: Infarct in central pons ?Chronic CTA head & neck: High-grade stenosis of the proximal basilar artery.  Fetal-type right PCA. MRI acute/subacute nonhemorrhagic right pontine infarct.  Remote left paramedian pontine infarct.  Remote nonhemorrhagic infarct of right lentiform nucleus.  Remote lacunar infarct of left lateral cerebellum. 2D Echo awaiting read LDL 97 (131 3 months ago) [id number] 8.0 VTE prophylaxis -IV heparin from home Xarelto, will likely discharge on Eliquis + Brilinta after stent placed Therapy recommendations: PT: Outpatient follow-up, OT: Awaiting recs Disposition: Home with wife  Atrial fibrillation Home Meds: Xarelto 20 nightly Continue telemetry monitoring Continue IV heparin as above  Hypertension Home meds: Lisinopril 20, continued Unstable, sBPs 160-180s. Will allow this in setting of basilar stenosis -- real danger of hypoperfusion/further stroke if we treat his HTN too aggressively Blood Pressure Goal: BP less than 180/105   Hyperlipidemia:  Home meds: None LDL 97, goal < 70 Await fasting lipid panel.  Patient does not take statin.  Diabetes type II uncontrolled Home meds: Metformin 1000 mg daily, Farxiga 10 mg HgbA1c 8.0, goal < 7.0, glucoses  150s-200s CBGs Very sensitive sliding scale insulin Recommend close follow-up with PCP for better DM control  Substance Abuse Patient uses alcohol Discuss willingness to abstain from alcohol, assess stage of change  Other Stroke Risk Factors Obesity, Body mass index is 31.87 kg/m., BMI >/= 30 associated with increased stroke risk, recommend weight loss, diet and exercise as appropriate  Current alcohol use Obstructive sleep apnea, on CPAP at home   Other Active Problems 1.8 cm hypodense nodule in the right lobe of the thyroid: Follow-up outpatient  I have personally obtained history,examined this patient, reviewed notes, independently viewed imaging studies, participated in medical decision making and plan of care.ROS completed by me personally and pertinent positives fully documented  I have made any additions or clarifications directly to the above note. Agree with note above.  Patient is neurologically stable with only mild truncal ataxia.  Continue IV heparin and plan for elective cerebral catheter angiogram on 04/09/2023 with plan for angioplasty stenting.  Continue aggressive risk factor modification.  Long discussion with patient and answered questions.  Discussed with Dr. Deno Etienne and Dr. Sherlon Handing.  Greater than 50% time during this 35-minute visit was spent in counseling and coordination of care and discussion with patient and care team and answering  questions.  Delia Heady, MD Medical Director [facility] Pager: [phone] 04/07/2023 1:42 PM

## 2023-04-07 NOTE — Progress Notes (Signed)
2D echo attempted, patient with PT, will return at patient's request around 11:15am.

## 2023-04-07 NOTE — Progress Notes (Signed)
OT Screen Note  Patient Details Name: Charles Phelps MRN: [id number] DOB: 1966-06-18   Cancelled Treatment:    Reason Eval/Treat Not Completed: OT screened, no needs identified, will sign off OT communicated with [name] and be at /near baseline at this time without acute needs. OT will sign off  [name] 04/07/2023, 1:10 PM

## 2023-04-07 NOTE — ED Notes (Addendum)
Swallow screen performed by this RN. Pt passed swallow screen successfully.

## 2023-04-07 NOTE — Progress Notes (Signed)
ANTICOAGULATION CONSULT NOTE  Pharmacy Consult for heparin Indication: atrial fibrillation  Allergies  Allergen Reactions   Atorvastatin Other (See Comments)    Memory problems    Patient Measurements: Height: 6\' 3"  (190.5 cm) Weight: 115.7 kg (255 lb) IBW/kg (Calculated) : 84.5 Heparin Dosing Weight: 108kg  Vital Signs: Temp: 98.6 F (37 C) (09/09 1929) Temp Source: Oral (09/09 1929) BP: 173/84 (09/09 1929) Pulse Rate: 62 (09/09 1929)  Labs: Recent Labs    04/05/23 1201 04/06/23 0100 04/07/23 0803 04/07/23 0816 04/07/23 1855  HGB 14.8 14.1  --   --   --   HCT 42.7 40.6  --   --   --   PLT 173 155  --   --   --   APTT  --   --  61*  --  65*  HEPARINUNFRC  --   --  0.84*  --   --   CREATININE 1.63* 1.32*  --  1.37*  --     Estimated Creatinine Clearance: 80.6 mL/min (A) (by C-G formula based on SCr of 1.37 mg/dL (H)).   Medical History: Past Medical History:  Diagnosis Date   Allergic rhinitis    Anxiety    Arthritis    Atrial fibrillation (HCC)    Diabetes (HCC)    Elevated LFTs 2000   fatty liver   GERD (gastroesophageal reflux disease)    Glucose intolerance (impaired glucose tolerance)    Gout    Hyperlipidemia    Hypertension    IBS (irritable bowel syndrome)    Obstructive sleep apnea    Osteoarthritis     Assessment: 64 YOM presenting with CVA with hx afib on Xarelto PTA, last dose administered 9/7 @2200  in ED. Planing diagnostic angiogram and possible stenting. Pharmacy consulted for heparin.    aPTT uptrend however slightly subtherapeutic at 65 seconds s/p rate increase to 1500 units/hr  Goal of Therapy:   Heparin level 0.3 -0.5 units/ml aPTT 66-85 sec Monitor platelets by anticoagulation protocol: Yes   Plan:  Increase heparin gtt to 1600 units/hr F/u 6 hour aPTT  Daylene Posey, PharmD, [facility] Clinical Pharmacist ED Pharmacist Phone # [phone] 04/07/2023 8:35 PM

## 2023-04-07 NOTE — Progress Notes (Signed)
ANTICOAGULATION CONSULT NOTE - Initial Consult  Pharmacy Consult for heparin Indication: atrial fibrillation  Allergies  Allergen Reactions   Atorvastatin Other (See Comments)    Memory problems    Patient Measurements: Height: 6\' 3"  (190.5 cm) Weight: 115.7 kg (255 lb) IBW/kg (Calculated) : 84.5 Heparin Dosing Weight: 108kg  Vital Signs: Temp: 97.5 F (36.4 C) (09/09 0328) Temp Source: Axillary (09/09 0328) BP: 142/58 (09/09 0328) Pulse Rate: 58 (09/09 0328)  Labs: Recent Labs    04/05/23 1201 04/06/23 0100  HGB 14.8 14.1  HCT 42.7 40.6  PLT 173 155  CREATININE 1.63* 1.32*    Estimated Creatinine Clearance: 83.7 mL/min (A) (by C-G formula based on SCr of 1.32 mg/dL (H)).   Medical History: Past Medical History:  Diagnosis Date   Allergic rhinitis    Anxiety    Arthritis    Atrial fibrillation (HCC)    Diabetes (HCC)    Elevated LFTs 2000   fatty liver   GERD (gastroesophageal reflux disease)    Glucose intolerance (impaired glucose tolerance)    Gout    Hyperlipidemia    Hypertension    IBS (irritable bowel syndrome)    Obstructive sleep apnea    Osteoarthritis     Assessment: 17 YOM presenting with CVA with hx afib on Xarelto PTA, last dose administered 9/7 @2200  in ED. Planing diagnostic angiogram and possible stenting. Pharmacy consulted for heparin.    Heparin level 0.84 still affected by rivaroxaban. aPTT is 61 is just subtherapeutic on 1400 units/hr.  No issues with infusion or bleeding per RN. Planning basal artery stent 9/11.    Goal of Therapy:   Heparin level 0.3 -0.5 units/ml aPTT 66-85 sec Monitor platelets by anticoagulation protocol: Yes   Plan:  Increase Heparin gtt at 1500 units/hr, no bolus F/u aPTT in 8 hrs F/u aPTT until correlates with heparin level  Monitor daily aPTT, heparin level, CBC, signs/symptoms of bleeding  F/u ability to transition back to Xarelto   Alphia Moh, PharmD, BCPS, BCCP Clinical  Pharmacist  Please check AMION for all [facility] phone numbers After 10:00 PM, call Main Pharmacy [phone]

## 2023-04-07 NOTE — Hospital Course (Signed)
56yo with OSA on CPAP, HTN, HLD, DM2, afib on xarelto who presented with ataxia and UE weakness, found to have R pontine infarct, fond to have high grade stenosis of the prox basilar artery. Pt was started on heparin gtt with plans for arteriogram

## 2023-04-07 NOTE — Plan of Care (Signed)

## 2023-04-07 NOTE — Evaluation (Signed)
Physical Therapy Evaluation Patient Details Name: Charles Phelps MRN: [id number] DOB: Jan 15, 1966 Today's Date: 04/07/2023  History of Present Illness  57 yo male admitted 9/7 with pontine CVA. PMH anxiety arthritis afib DM Gerd glucose intolerance gout HLD HTN IBS obstructive sleep apnea OA .   Clinical Impression  Pt admitted with above diagnosis. Nearly back to baseline. Pt notices some slight instability that he feels is atypical with a few dynamic balance and narrow BOS static challenges performed today. Low fall risk based on BERG and DGI. Will follow during admission to monitor for changes. Would like to reassess post-op for IR procedure this week and if no decline suspect pt will do well with OPPT, at his discretion since he is an active golfer and notices mild imbalance since CVA. Pt currently with functional limitations due to the deficits listed below (see PT Problem List). Pt will benefit from acute skilled PT to increase their independence and safety with mobility to allow discharge.           If plan is discharge home, recommend the following: Assist for transportation   Can travel by private vehicle    yes    Equipment Recommendations None recommended by PT     Functional Status Assessment Patient has had a recent decline in their functional status and demonstrates the ability to make significant improvements in function in a reasonable and predictable amount of time.     Precautions / Restrictions Precautions Precautions: None Restrictions Weight Bearing Restrictions: No      Mobility  Bed Mobility Overal bed mobility: Independent                  Transfers Overall transfer level: Modified independent Equipment used: None               General transfer comment: A little slow to rise but likely due to height from low bed, and reports hx of knee and hip issues. No LOB or equipment needed. Assisted with lines/leads only     Ambulation/Gait Ambulation/Gait assistance: Modified independent (Device/Increase time) Gait Distance (Feet): 125 Feet Assistive device: None Gait Pattern/deviations: WFL(Within Functional Limits) Gait velocity: norm Gait velocity interpretation: >4.37 ft/sec, indicative of normal walking speed   General Gait Details: Grossly WNL, minimal deviations noted with DGI testing but no overt LOB or buckling. Feels very close to baseline. No AD, assisted with lines/leads only.  Stairs            Wheelchair Mobility     Tilt Bed    Modified Rankin (Stroke Patients Only) Modified Rankin (Stroke Patients Only) Pre-Morbid Rankin Score: No symptoms Modified Rankin: No significant disability     Balance Overall balance assessment: Modified Independent                               Standardized Balance Assessment Standardized Balance Assessment : Dynamic Gait Index, Berg Balance Test Berg Balance Test Sit to Stand: Able to stand  independently using hands Standing Unsupported: Able to stand safely 2 minutes Sitting with Back Unsupported but Feet Supported on Floor or Stool: Able to sit safely and securely 2 minutes Stand to Sit: Sits safely with minimal use of hands Transfers: Able to transfer safely, minor use of hands Standing Unsupported with Eyes Closed: Able to stand 10 seconds safely Standing Ubsupported with Feet Together: Able to place feet together independently and stand 1 minute safely From Standing, Reach Forward with  Outstretched Arm: Can reach confidently >25 cm (10") From Standing Position, Pick up Object from Floor: Able to pick up shoe safely and easily From Standing Position, Turn to Look Behind Over each Shoulder: Looks behind from both sides and weight shifts well Turn 360 Degrees: Able to turn 360 degrees safely in 4 seconds or less Standing Unsupported, Alternately Place Feet on Step/Stool: Able to stand independently and complete 8 steps >20  seconds Standing Unsupported, One Foot in Front: Able to place foot tandem independently and hold 30 seconds Standing on One Leg: Able to lift leg independently and hold equal to or more than 3 seconds Total Score: 52 Dynamic Gait Index Level Surface: Normal Change in Gait Speed: Normal Gait with Horizontal Head Turns: Mild Impairment Gait with Vertical Head Turns: Mild Impairment Gait and Pivot Turn: Normal Step Over Obstacle: Mild Impairment Step Around Obstacles: Normal Steps: Mild Impairment Total Score: 20       Pertinent Vitals/Pain Pain Assessment Pain Assessment: No/denies pain    Home Living Family/patient expects to be discharged to:: Private residence Living Arrangements: Spouse/significant other;Children Available Help at Discharge: Family;Available 24 hours/day (wife available for a short period of time, works from home though) Type of Home: House Home Access: Stairs to enter   Secretary/administrator of Steps: 1   Home Layout: One level Home Equipment: Cane - single point      Prior Function Prior Level of Function : Independent/Modified Independent;Driving             Mobility Comments: ambulates without AD; very active, golfs regularly ADLs Comments: Ind no issues, retired from [employer]     Extremity/Trunk Assessment   Upper Extremity Assessment Upper Extremity Assessment: Defer to OT evaluation    Lower Extremity Assessment Lower Extremity Assessment: Overall WFL for tasks assessed       Communication   Communication Communication: No apparent difficulties  Cognition Arousal: Alert Behavior During Therapy: WFL for tasks assessed/performed Overall Cognitive Status: Within Functional Limits for tasks assessed                                          General Comments General comments (skin integrity, edema, etc.): Feels close to baseline, a little unsteady with dynamic challenges and narrow BOS activities.     Exercises     Assessment/Plan    PT Assessment Patient needs continued PT services  PT Problem List Decreased balance;Decreased mobility;Decreased coordination;Obesity       PT Treatment Interventions DME instruction;Gait training;Stair training;Functional mobility training;Therapeutic activities;Therapeutic exercise;Balance training;Neuromuscular re-education;Patient/family education    PT Goals (Current goals can be found in the Care Plan section)  Acute Rehab PT Goals Patient Stated Goal: Get well, return to gofling PT Goal Formulation: With patient Time For Goal Achievement: 04/21/23 Potential to Achieve Goals: Good    Frequency Min 1X/week     Co-evaluation               AM-PAC PT "6 Clicks" Mobility  Outcome Measure Help needed turning from your back to your side while in a flat bed without using bedrails?: None Help needed moving from lying on your back to sitting on the side of a flat bed without using bedrails?: None Help needed moving to and from a bed to a chair (including a wheelchair)?: None Help needed standing up from a chair using your arms (e.g., wheelchair or bedside  chair)?: None Help needed to walk in hospital room?: None Help needed climbing 3-5 steps with a railing? : None 6 Click Score: 24    End of Session   Activity Tolerance: Patient tolerated treatment well Patient left: in bed;with call bell/phone within reach   PT Visit Diagnosis: Unsteadiness on feet (R26.81);Other abnormalities of gait and mobility (R26.89);Apraxia (R48.2)    Time: 2956-2130 PT Time Calculation (min) (ACUTE ONLY): 16 min   Charges:   PT Evaluation $PT Eval Low Complexity: 1 Low   PT General Charges $$ ACUTE PT VISIT: 1 Visit         Kathlyn Sacramento, PT, DPT [facility]  Rehabilitation Services Physical Therapist Office: [phone] Website: [email or web address]   Berton Mount 04/07/2023, 10:52 AM

## 2023-04-07 NOTE — Progress Notes (Signed)
  Progress Note   Patient: Charles Phelps NWG:[id number] DOB: [birth year] DOA: 04/05/2023     2 DOS: the patient was seen and examined on 04/07/2023   Brief hospital course: 56yo with OSA on CPAP, HTN, HLD, DM2, afib on xarelto who presented with ataxia and UE weakness, found to have R pontine infarct, fond to have high grade stenosis of the prox basilar artery. Pt was started on heparin gtt with plans for arteriogram  Assessment and Plan: Central pons stroke -Currently on heparin gtt awaiting arteriogram -Per Neuro, plan eliquis with brilinta after stent placed -Outpt OT recommended by therapy  Afib -rate controlled -cont current regimen  HTN -bp suboptimal at this time -Per Neuro, OK to keep current range of bp given concern of hypoperfusion/further stroke if treated too aggressively -goal bp to keep <180/105  HLD -not on statin   DM2 -cont SSI as needed -glucose overall seems stable  ETOH abuse -will need to abstain in the future     Subjective: Eager to go home soon  Physical Exam: Vitals:   04/07/23 0328 04/07/23 0745 04/07/23 1311 04/07/23 1712  BP: (!) 142/58 (!) 167/86 (!) 169/88 (!) 171/89  Pulse: (!) 58 (!) 57 68 66  Resp: 18 18 18 18   Temp: (!) 97.5 F (36.4 C) 97.6 F (36.4 C) 98.7 F (37.1 C) 98.9 F (37.2 C)  TempSrc: Axillary Oral Oral Oral  SpO2: 98% 100% 99% 99%  Weight:      Height:       General exam: Awake, laying in bed, in nad Respiratory system: Normal respiratory effort, no wheezing Cardiovascular system: regular rate, s1, s2 Gastrointestinal system: Soft, nondistended, positive BS Central nervous system: CN2-12 grossly intact, strength intact Extremities: Perfused, no clubbing Skin: Normal skin turgor, no notable skin lesions seen Psychiatry: Mood normal // no visual hallucinations   Data Reviewed:  Labs reviewed: Na 139, K 3.9, Cr 1.37  Family Communication: Pt in room, family not at bedside  Disposition: Status is:  Inpatient Remains inpatient appropriate because: severity of illness  Planned Discharge Destination: Home    Author: Rickey Barbara, MD 04/07/2023 5:54 PM  For on call review [email or web address].

## 2023-04-08 ENCOUNTER — Other Ambulatory Visit: Payer: Self-pay | Admitting: Internal Medicine

## 2023-04-08 DIAGNOSIS — I639 Cerebral infarction, unspecified: Secondary | ICD-10-CM | POA: Diagnosis not present

## 2023-04-08 LAB — GLUCOSE, CAPILLARY
Glucose-Capillary: 174 mg/dL — ABNORMAL HIGH (ref 70–99)
Glucose-Capillary: 177 mg/dL — ABNORMAL HIGH (ref 70–99)
Glucose-Capillary: 194 mg/dL — ABNORMAL HIGH (ref 70–99)
Glucose-Capillary: 154 mg/dL — ABNORMAL HIGH (ref 70–99)

## 2023-04-08 LAB — APTT: aPTT: 83 s — ABNORMAL HIGH (ref 24–36)

## 2023-04-08 LAB — CBC
HCT: 40.9 % (ref 39.0–52.0)
Hemoglobin: 14.2 g/dL (ref 13.0–17.0)
MCH: 29.1 pg (ref 26.0–34.0)
MCHC: 34.7 g/dL (ref 30.0–36.0)
MCV: 83.8 fL (ref 80.0–100.0)
Platelets: 139 10*3/uL — ABNORMAL LOW (ref 150–400)
RBC: 4.88 MIL/uL (ref 4.22–5.81)
RDW: 13.2 % (ref 11.5–15.5)
WBC: 5.7 10*3/uL (ref 4.0–10.5)
nRBC: 0 % (ref 0.0–0.2)

## 2023-04-08 LAB — HEPARIN LEVEL (UNFRACTIONATED): Heparin Unfractionated: 0.41 [IU]/mL (ref 0.30–0.70)

## 2023-04-08 MED ORDER — CEFAZOLIN SODIUM-DEXTROSE 2-4 GM/100ML-% IV SOLN
2.0000 g | INTRAVENOUS | Status: AC
  Administered 2023-04-09: 2 g via INTRAVENOUS
  Filled 2023-04-08: qty 100

## 2023-04-08 MED ORDER — TICAGRELOR 90 MG PO TABS
90.0000 mg | ORAL_TABLET | Freq: Two times a day (BID) | ORAL | Status: DC
  Administered 2023-04-09: 90 mg via ORAL
  Filled 2023-04-08: qty 1

## 2023-04-08 MED ORDER — SODIUM CHLORIDE 0.9 % IV SOLN: INTRAVENOUS | Status: DC

## 2023-04-08 MED ORDER — NIMODIPINE 30 MG PO CAPS
0.0000 mg | ORAL_CAPSULE | ORAL | Status: AC
  Administered 2023-04-09: 60 mg via ORAL
  Filled 2023-04-08 (×2): qty 2

## 2023-04-08 MED ORDER — TICAGRELOR 90 MG PO TABS
180.0000 mg | ORAL_TABLET | Freq: Once | ORAL | Status: AC
  Administered 2023-04-08: 180 mg via ORAL
  Filled 2023-04-08: qty 2

## 2023-04-08 MED ORDER — ASPIRIN 81 MG PO CHEW
81.0000 mg | CHEWABLE_TABLET | Freq: Every day | ORAL | Status: DC
  Administered 2023-04-08 – 2023-04-09 (×2): 81 mg via ORAL
  Filled 2023-04-08 (×2): qty 1

## 2023-04-08 MED ORDER — SIMVASTATIN 20 MG PO TABS
40.0000 mg | ORAL_TABLET | Freq: Every day | ORAL | Status: DC
  Administered 2023-04-08 – 2023-04-09 (×2): 40 mg via ORAL
  Filled 2023-04-08 (×2): qty 2

## 2023-04-08 MED ORDER — HYDRALAZINE HCL 20 MG/ML IJ SOLN
5.0000 mg | INTRAMUSCULAR | Status: DC | PRN
  Administered 2023-04-08 – 2023-04-09 (×3): 5 mg via INTRAVENOUS
  Filled 2023-04-08 (×3): qty 1

## 2023-04-08 NOTE — Plan of Care (Signed)
Patient is scheduled for diagnostic angiogram with possible basilar artery stenting under GA tomorrow 11:30.   Per Dr. Tommie Sams, patient need ASA 81 mg and loading dose of Brilinta this morning, followed by ASA 81 mg every day and Brilinta 90 mg BID.  Order placed, RN notified.   NIR aware that patient is on heparin infusion.  Patient MUST be on DAPT for the neuro intervemtions, please consider reducing/discontinuing heparin if there is concern for high risk of bleeding.   PLAN - NPO except meds at [place] - CBC/BMP/INR in AM - Please give ASA 81 mg and Brilinta 90 mg in AM - IR APP will see the patient for same day H & P tomorrow - Please call IR for concerns and questions   Charles Iglesia [name] Matt PA-C 04/08/2023 10:29 AM

## 2023-04-08 NOTE — Plan of Care (Signed)

## 2023-04-08 NOTE — Progress Notes (Signed)
  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 Pt was started on heparin gtt with plans for arteriogram  Assessment and Plan: Central pons stroke -Currently on heparin gtt awaiting arteriogram on 9/11 -Per Neuro, plan eliquis with brilinta after stent placed -Outpt OT recommended by therapy  Afib -remains rate controlled -Continue cardizem CD 120mg  -cont current regimen  HTN -bp suboptimal at this time -Per Neuro, OK to keep current range of bp given concern of hypoperfusion/further stroke if treated too aggressively -goal bp to keep <180/105  HLD -per neuro, not on statin  DM2 -cont SSI as needed -glucose overall seems stable  ETOH abuse -will need to abstain in the future -no signs of withdrawls     Subjective: Hoping to be able to go home soon  Physical Exam: Vitals:   04/07/23 1929 04/08/23 0409 04/08/23 1028 04/08/23 1232  BP: (!) 173/84 (!) 186/87 (!) 157/87 (!) 169/93  Pulse: 62 (!) 53 66 67  Resp: 18 19 18 18   Temp: 98.6 F (37 C) 97.6 F (36.4 C) 98.7 F (37.1 C) 97.6 F (36.4 C)  TempSrc: Oral Oral Oral Oral  SpO2: 98% 98% 99% 98%  Weight:      Height:       General exam: Conversant, in no acute distress Respiratory system: normal chest rise, clear, no audible wheezing Cardiovascular system: regular rhythm, s1-s2 Gastrointestinal system: Nondistended, nontender, pos BS Central nervous system: No seizures, no tremors Extremities: No cyanosis, no joint deformities Skin: No rashes, no pallor Psychiatry: Affect normal // no auditory hallucinations   Data Reviewed:  Labs reviewed: WBC 5.7, Hgb 14.2, Plts 139  Family Communication: Pt  in room, family not at bedside  Disposition: Status is: Inpatient Remains inpatient appropriate because: severity of illness  Planned Discharge Destination: Home    Author: Rickey Barbara, MD 04/08/2023 4:33 PM  For on call review [email or web address].

## 2023-04-08 NOTE — Progress Notes (Addendum)
STROKE TEAM PROGRESS NOTE   BRIEF HPI [name] is a 57 y.o. male with history of OSA (CPAP compliant), hypertension, hyperlipidemia, T2DM, and atrial fibrillation on Xarelto nightly who presents with a 5-day history of discoordination, ataxia and slight bilateral upper extremity weakness.  Yesterday, patient could not play golf as he typically would. Of note patient had 5 days of similar symptoms 6 to 7 weeks ago.  His wife had him come to the emergency department.  MRI showed acute/subacute nonhemorrhagic right pontine infarct.  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  Awaiting echo.  Will need cerebral angiogram to determine if he is a candidate for stenting/coiling.  Last Xarelto p.m. of 9/7 - will need to postpone procedure until Tuesday, September 10 at the earliest.  Currently in NSR.   SIGNIFICANT HOSPITAL EVENTS 9/7: Presented.  MRI: Acute pontine stroke. 9/8: DVT prophylaxis: Xarelto discontinued in favor of heparin with eye towards cerebral angiogram 9/11.  9/9: Continued ataxia.  Agreed to statin use. 9/10: Began simvastatin 40.  Brilinta loading dose and p.m. for imaging +/- stent tomorrow.  On aspirin and heparin.  INTERIM HISTORY/SUBJECTIVE  No acute events overnight.  Feeling "okay". Will be put on Brilinta for 4 weeks. Will also need Eliquis for A-fib.  Aspirin okay for now in setting of planned angioplasty stenting.  Patient amenable to switching home Xarelto to Eliquis twice daily.  NPO at midnight for diagnostic angiogram +/- basilar artery stenting.  No new concerns. Saccades on leftwards horizontal gaze persistent but improving.  No other focal neurological findings.   OBJECTIVE  CBC    Component Value Date/Time   WBC 5.7 04/08/2023 0631   RBC 4.88 04/08/2023 0631   HGB 14.2 04/08/2023 0631   HCT 40.9 04/08/2023 0631   PLT 139 (L) 04/08/2023 0631   MCV 83.8 04/08/2023  0631   MCH 29.1 04/08/2023 0631   MCHC 34.7 04/08/2023 0631   RDW 13.2 04/08/2023 0631   LYMPHSABS 1.3 06/06/2016 0849   MONOABS 0.4 06/06/2016 0849   EOSABS 0.1 06/06/2016 0849   BASOSABS 0.1 06/06/2016 0849    BMET    Component Value Date/Time   NA 139 04/07/2023 0816   K 3.9 04/07/2023 0816   CL 103 04/07/2023 0816   CO2 27 04/07/2023 0816   GLUCOSE 179 (H) 04/07/2023 0816   BUN 15 04/07/2023 0816   CREATININE 1.37 (H) 04/07/2023 0816   CALCIUM 8.8 (L) 04/07/2023 0816   GFRNONAA 60 (L) 04/07/2023 0816    IMAGING past 24 hours No results found.  Vitals:   04/07/23 1712 04/07/23 1929 04/08/23 0409 04/08/23 1028  BP: (!) 171/89 (!) 173/84 (!) 186/87 (!) 157/87  Pulse: 66 62 (!) 53 66  Resp: 18 18 19 18   Temp: 98.9 F (37.2 C) 98.6 F (37 C) 97.6 F (36.4 C) 98.7 F (37.1 C)  TempSrc: Oral Oral Oral Oral  SpO2: 99% 98% 98% 99%  Weight:      Height:         PHYSICAL EXAM General:  Alert, well-nourished, well-developed patient in no acute distress Psych:  Mood and affect appropriate for situation CV: Regular rate and rhythm on monitor Respiratory:  Regular, unlabored respirations on room air GI: Abdomen soft and nontender   NEURO:  Mental Status: AA&Ox3, patient is able to give clear and coherent history Speech/Language: speech is without dysarthria or aphasia.  Naming, repetition, fluency,  and comprehension intact.  Cranial Nerves:  II: Visual fields full.  III, IV, VI: EOMI. Saccadic dysmetria on horizontal gaze left greater than right, continues to improve today 9/10. VII: Smile symmetrical VIII: hearing intact to voice. IX, X:Phonation is normal.  Motor:Grossy normal Tone: is normal and bulk is normal Coordination: FTN intact bilaterally Gait: deferred   ASSESSMENT/PLAN  Acute pontine ischemic infarct in the setting of high-grade basilar artery stenosis.  He also has old left paramedian pontine infarct in the same vascular  distribution  Etiology: Patient has high-grade stenosis in the proximal basilar artery.  This is the likely source of patient's repeated brainstem infarct.  Diagnostic angiogram +/- basilar artery stenting tomorrow 9/11.  We will start simvastatin for the time being. Aware that previously patient has had issues with this medication -- understands that this will help with stroke risk until he can switch to Repatha.  Can switch to close including dose improvement to an aspirin tonight. OK to discharge on Eliquis for Afib and Brillinta antiplatelet therapy. Stroke team will sign off at this time.  Code Stroke CT head: Infarct in central pons ?Chronic CTA head & neck: High-grade stenosis of the proximal basilar artery.  Fetal-type right PCA. MRI acute/subacute nonhemorrhagic right pontine infarct.  Remote left paramedian pontine infarct.  Remote nonhemorrhagic infarct of right lentiform nucleus.  Remote lacunar infarct of left lateral cerebellum. 2D Echo awaiting read LDL 97 (131 3 months ago) [id number] 8.0 VTE prophylaxis -IV heparin from home Xarelto, will likely discharge on Eliquis + Brilinta after stent placed Therapy recommendations: PT: Outpatient follow-up, OT: Awaiting recs Disposition: Home with wife  Atrial fibrillation Home Meds: Xarelto 20 nightly Continue telemetry monitoring Continue IV heparin as above  Hypertension Home meds: Lisinopril 20, continued Unstable, sBPs 160-180s. Will allow this in setting of basilar stenosis -- real danger of hypoperfusion/further stroke if we treat his HTN too aggressively Blood Pressure Goal: BP less than 180/105   Hyperlipidemia:  Home meds: None LDL 97, goal < 70 Started simvastatin 40, can switch to Repatha injection outpatient as patient has not tolerated statins (atorvastatin/rosuvastatin) in the past (memory issues)  Other Active Problems 1.8 cm hypodense nodule in the right lobe of the thyroid: Follow-up outpatient for [place] +/-  FNA  Diabetes type II uncontrolled Home meds: Metformin 1000 mg daily, Farxiga 10 mg HgbA1c 8.0, goal < 7.0, glucoses 150s-200s CBGs Very sensitive sliding scale insulin Recommend close follow-up with PCP for better DM control  Substance Abuse Patient uses alcohol Discuss willingness to abstain from alcohol, assess stage of change  Other Stroke Risk Factors Obesity, Body mass index is 31.87 kg/m., BMI >/= 30 associated with increased stroke risk, recommend weight loss, diet and exercise as appropriate  Current alcohol use Obstructive sleep apnea, on CPAP at home   Charles Iron, MD Psychiatry Resident, PGY-1  I have personally obtained history,examined this patient, reviewed notes, independently viewed imaging studies, participated in medical decision making and plan of care.ROS completed by me personally and pertinent positives fully documented  I have made any additions or clarifications directly to the above note. Agree with note above.  Continue IV heparin and aspirin and Brilinta for now and switch to Brilinta and anticoagulation after angioplasty stenting tomorrow.  Charles Heady, MD Medical Director [facility] Pager: [phone] 04/08/2023 12:54 PM

## 2023-04-08 NOTE — Plan of Care (Signed)
  Problem: Education: Goal: Ability to describe self-care measures that may prevent or decrease complications (Diabetes Survival Skills Education) will improve Outcome: Progressing Goal: Individualized Educational Video(s) Outcome: Progressing   Problem: Coping: Goal: Ability to adjust to condition or change in health will improve Outcome: Progressing   Problem: Fluid Volume: Goal: Ability to maintain a balanced intake and output will improve Outcome: Progressing   Problem: Health Behavior/Discharge Planning: Goal: Ability to identify and utilize available resources and services will improve Outcome: Progressing Goal: Ability to manage health-related needs will improve Outcome: Progressing   Problem: Metabolic: Goal: Ability to maintain appropriate glucose levels will improve Outcome: Progressing   Problem: Nutritional: Goal: Maintenance of adequate nutrition will improve Outcome: Progressing Goal: Progress toward achieving an optimal weight will improve Outcome: Progressing   Problem: Skin Integrity: Goal: Risk for impaired skin integrity will decrease Outcome: Progressing   Problem: Tissue Perfusion: Goal: Adequacy of tissue perfusion will improve Outcome: Progressing   Problem: Education: Goal: Knowledge of General Education information will improve Description: Including pain rating scale, medication(s)/side effects and non-pharmacologic comfort measures Outcome: Progressing   Problem: Health Behavior/Discharge Planning: Goal: Ability to manage health-related needs will improve Outcome: Progressing   Problem: Clinical Measurements: Goal: Ability to maintain clinical measurements within normal limits will improve Outcome: Progressing Goal: Will remain free from infection Outcome: Progressing Goal: Diagnostic test results will improve Outcome: Progressing Goal: Respiratory complications will improve Outcome: Progressing Goal: Cardiovascular complication will  be avoided Outcome: Progressing   Problem: Activity: Goal: Risk for activity intolerance will decrease Outcome: Progressing   Problem: Nutrition: Goal: Adequate nutrition will be maintained Outcome: Progressing   Problem: Coping: Goal: Level of anxiety will decrease Outcome: Progressing   Problem: Elimination: Goal: Will not experience complications related to bowel motility Outcome: Progressing Goal: Will not experience complications related to urinary retention Outcome: Progressing   Problem: Pain Managment: Goal: General experience of comfort will improve Outcome: Progressing   Problem: Safety: Goal: Ability to remain free from injury will improve Outcome: Progressing   Problem: Skin Integrity: Goal: Risk for impaired skin integrity will decrease Outcome: Progressing   Problem: Education: Goal: Knowledge of disease or condition will improve Outcome: Progressing Goal: Knowledge of secondary prevention will improve (MUST DOCUMENT ALL) Outcome: Progressing Goal: Knowledge of patient specific risk factors will improve Loraine Leriche N/A or DELETE if not current risk factor) Outcome: Progressing   Problem: Ischemic Stroke/TIA Tissue Perfusion: Goal: Complications of ischemic stroke/TIA will be minimized Outcome: Progressing

## 2023-04-08 NOTE — Progress Notes (Signed)
ANTICOAGULATION CONSULT NOTE  Pharmacy Consult for heparin Indication: atrial fibrillation  Allergies  Allergen Reactions   Atorvastatin Other (See Comments)    Memory problems    Patient Measurements: Height: 6\' 3"  (190.5 cm) Weight: 115.7 kg (255 lb) IBW/kg (Calculated) : 84.5 Heparin Dosing Weight: 108kg  Vital Signs: Temp: 97.6 F (36.4 C) (09/10 0409) Temp Source: Oral (09/10 0409) BP: 186/87 (09/10 0409) Pulse Rate: 53 (09/10 0409)  Labs: Recent Labs    04/05/23 1201 04/06/23 0100 04/07/23 0803 04/07/23 0816 04/07/23 1855  HGB 14.8 14.1  --   --   --   HCT 42.7 40.6  --   --   --   PLT 173 155  --   --   --   APTT  --   --  61*  --  65*  HEPARINUNFRC  --   --  0.84*  --   --   CREATININE 1.63* 1.32*  --  1.37*  --     Estimated Creatinine Clearance: 80.6 mL/min (A) (by C-G formula based on SCr of 1.37 mg/dL (H)).   Medical History: Past Medical History:  Diagnosis Date   Allergic rhinitis    Anxiety    Arthritis    Atrial fibrillation (HCC)    Diabetes (HCC)    Elevated LFTs 2000   fatty liver   GERD (gastroesophageal reflux disease)    Glucose intolerance (impaired glucose tolerance)    Gout    Hyperlipidemia    Hypertension    IBS (irritable bowel syndrome)    Obstructive sleep apnea    Osteoarthritis     Assessment: 75 YOM presenting with CVA with hx afib on Xarelto PTA, last dose administered 9/7 @2200  in ED. Planing diagnostic angiogram and possible stenting. Pharmacy consulted for heparin.    Heparin level 0.41 appears to be correlating with aPTT 83, both are therapeutic on 1600 units/hr. Planning basal artery stent 9/11.    Goal of Therapy:   Heparin level 0.3 -0.5 units/ml aPTT 66-85 sec Monitor platelets by anticoagulation protocol: Yes   Plan:  Continue heparin gtt to 1600 units/hr   Monitor daily heparin level, CBC, signs/symptoms of bleeding    Alphia Moh, PharmD, BCPS, [facility] Clinical Pharmacist  Please check AMION for  all [facility] Pharmacy phone numbers After 10:00 PM, call Main Pharmacy [phone]

## 2023-04-09 ENCOUNTER — Other Ambulatory Visit (HOSPITAL_COMMUNITY): Payer: BC Managed Care – PPO

## 2023-04-09 ENCOUNTER — Inpatient Hospital Stay (HOSPITAL_COMMUNITY): Payer: Self-pay | Admitting: Anesthesiology

## 2023-04-09 ENCOUNTER — Other Ambulatory Visit (HOSPITAL_COMMUNITY): Payer: Self-pay

## 2023-04-09 ENCOUNTER — Encounter (HOSPITAL_COMMUNITY): Payer: Self-pay | Admitting: Internal Medicine

## 2023-04-09 ENCOUNTER — Encounter (HOSPITAL_COMMUNITY)
Admission: EM | Disposition: A | Discharge status: Home / Self Care | Payer: Self-pay | Source: Home / Self Care | Attending: Internal Medicine

## 2023-04-09 ENCOUNTER — Other Ambulatory Visit: Payer: Self-pay

## 2023-04-09 ENCOUNTER — Inpatient Hospital Stay (HOSPITAL_COMMUNITY): Payer: BC Managed Care – PPO

## 2023-04-09 DIAGNOSIS — I639 Cerebral infarction, unspecified: Secondary | ICD-10-CM | POA: Diagnosis not present

## 2023-04-09 HISTORY — PX: RADIOLOGY WITH ANESTHESIA: SHX6223

## 2023-04-09 HISTORY — PX: IR US GUIDE VASC ACCESS RIGHT: IMG2390

## 2023-04-09 HISTORY — PX: IR ANGIO INTRA EXTRACRAN SEL INTERNAL CAROTID BILAT MOD SED: IMG5363

## 2023-04-09 HISTORY — PX: IR ENDOVASC INTRACRANIAL INF OTHER THAN THROMBO ART INC DIAG ANGIO EA ADD: IMG6089

## 2023-04-09 HISTORY — PX: IR ANGIO VERTEBRAL SEL VERTEBRAL BILAT MOD SED: IMG5369

## 2023-04-09 LAB — CBC
HCT: 43.8 % (ref 39.0–52.0)
Hemoglobin: 15.1 g/dL (ref 13.0–17.0)
MCH: 28.5 pg (ref 26.0–34.0)
MCHC: 34.5 g/dL (ref 30.0–36.0)
MCV: 82.6 fL (ref 80.0–100.0)
Platelets: 153 10*3/uL (ref 150–400)
RBC: 5.3 MIL/uL (ref 4.22–5.81)
RDW: 13.3 % (ref 11.5–15.5)
WBC: 6 10*3/uL (ref 4.0–10.5)
nRBC: 0 % (ref 0.0–0.2)

## 2023-04-09 LAB — BASIC METABOLIC PANEL
Anion gap: 14 (ref 5–15)
BUN: 10 mg/dL (ref 6–20)
CO2: 21 mmol/L — ABNORMAL LOW (ref 22–32)
Calcium: 9 mg/dL (ref 8.9–10.3)
Chloride: 104 mmol/L (ref 98–111)
Creatinine, Ser: 1.18 mg/dL (ref 0.61–1.24)
GFR, Estimated: >60 mL/min (ref >60)
Glucose, Bld: 189 mg/dL — ABNORMAL HIGH (ref 70–99)
Potassium: 3.5 mmol/L (ref 3.5–5.1)
Sodium: 139 mmol/L (ref 135–145)

## 2023-04-09 LAB — GLUCOSE, CAPILLARY
Glucose-Capillary: 199 mg/dL — ABNORMAL HIGH (ref 70–99)
Glucose-Capillary: 166 mg/dL — ABNORMAL HIGH (ref 70–99)
Glucose-Capillary: 167 mg/dL — ABNORMAL HIGH (ref 70–99)
Glucose-Capillary: 242 mg/dL — ABNORMAL HIGH (ref 70–99)

## 2023-04-09 LAB — PROTIME-INR
INR: 1.1 (ref 0.8–1.2)
Prothrombin Time: 14.3 s (ref 11.4–15.2)

## 2023-04-09 LAB — HEPARIN LEVEL (UNFRACTIONATED): Heparin Unfractionated: 0.3 [IU]/mL (ref 0.30–0.70)

## 2023-04-09 SURGERY — IR WITH ANESTHESIA
Anesthesia: General

## 2023-04-09 MED ORDER — FENTANYL CITRATE (PF) 100 MCG/2ML IJ SOLN: 25.0000 ug | INTRAMUSCULAR | Status: DC | PRN

## 2023-04-09 MED ORDER — CLEVIDIPINE BUTYRATE 0.5 MG/ML IV EMUL
INTRAVENOUS | Status: AC
  Filled 2023-04-09: qty 50

## 2023-04-09 MED ORDER — PROMETHAZINE HCL 25 MG/ML IJ SOLN: 6.2500 mg | INTRAMUSCULAR | Status: DC | PRN

## 2023-04-09 MED ORDER — INSULIN ASPART 100 UNIT/ML IJ SOLN
0.0000 [IU] | INTRAMUSCULAR | Status: DC | PRN
  Administered 2023-04-09: 2 [IU] via SUBCUTANEOUS
  Filled 2023-04-09: qty 1

## 2023-04-09 MED ORDER — APIXABAN 5 MG PO TABS
5.0000 mg | ORAL_TABLET | Freq: Two times a day (BID) | ORAL | 11 refills | Status: DC
Start: 2023-04-09 — End: 2023-10-09
  Filled 2023-04-09: qty 60, 30d supply, fill #0

## 2023-04-09 MED ORDER — CLEVIDIPINE BUTYRATE 0.5 MG/ML IV EMUL: 0.0000 mg/h | INTRAVENOUS | Status: DC

## 2023-04-09 MED ORDER — OXYCODONE HCL 5 MG/5ML PO SOLN: 5.0000 mg | Freq: Once | ORAL | Status: DC | PRN

## 2023-04-09 MED ORDER — VERAPAMIL HCL 2.5 MG/ML IV SOLN
INTRAVENOUS | Status: AC
  Filled 2023-04-09: qty 2

## 2023-04-09 MED ORDER — OXYCODONE HCL 5 MG PO TABS: 5.0000 mg | ORAL_TABLET | Freq: Once | ORAL | Status: DC | PRN

## 2023-04-09 MED ORDER — PHENYLEPHRINE 80 MCG/ML (10ML) SYRINGE FOR IV PUSH (FOR BLOOD PRESSURE SUPPORT)
PREFILLED_SYRINGE | INTRAVENOUS | Status: DC | PRN
  Administered 2023-04-09 (×2): 80 ug via INTRAVENOUS
  Administered 2023-04-09: 160 ug via INTRAVENOUS

## 2023-04-09 MED ORDER — SODIUM CHLORIDE 0.9 % IV BOLUS: 250.0000 mL | INTRAVENOUS | Status: DC | PRN

## 2023-04-09 MED ORDER — ROCURONIUM BROMIDE 10 MG/ML (PF) SYRINGE
PREFILLED_SYRINGE | INTRAVENOUS | Status: DC | PRN
  Administered 2023-04-09: 30 mg via INTRAVENOUS
  Administered 2023-04-09: 20 mg via INTRAVENOUS
  Administered 2023-04-09: 60 mg via INTRAVENOUS

## 2023-04-09 MED ORDER — MEPERIDINE HCL 25 MG/ML IJ SOLN: 6.2500 mg | INTRAMUSCULAR | Status: DC | PRN

## 2023-04-09 MED ORDER — NIMODIPINE 30 MG PO CAPS
ORAL_CAPSULE | ORAL | Status: AC
  Filled 2023-04-09: qty 1

## 2023-04-09 MED ORDER — PROPOFOL 10 MG/ML IV BOLUS
INTRAVENOUS | Status: DC | PRN
  Administered 2023-04-09: 180 mg via INTRAVENOUS

## 2023-04-09 MED ORDER — SUCCINYLCHOLINE CHLORIDE 200 MG/10ML IV SOSY
PREFILLED_SYRINGE | INTRAVENOUS | Status: DC | PRN
  Administered 2023-04-09: 140 mg via INTRAVENOUS

## 2023-04-09 MED ORDER — SODIUM CHLORIDE 0.9 % IV SOLN: INTRAVENOUS | Status: DC

## 2023-04-09 MED ORDER — ORAL CARE MOUTH RINSE: 15.0000 mL | Freq: Once | OROMUCOSAL | Status: AC

## 2023-04-09 MED ORDER — PHENYLEPHRINE HCL-NACL 20-0.9 MG/250ML-% IV SOLN
INTRAVENOUS | Status: DC | PRN
  Administered 2023-04-09: 30 ug/min via INTRAVENOUS

## 2023-04-09 MED ORDER — ATORVASTATIN CALCIUM 40 MG PO TABS
40.0000 mg | ORAL_TABLET | Freq: Every day | ORAL | 11 refills | Status: DC
Start: 2023-04-09 — End: 2023-04-16
  Filled 2023-04-09: qty 30, 30d supply, fill #0

## 2023-04-09 MED ORDER — LABETALOL HCL 5 MG/ML IV SOLN
INTRAVENOUS | Status: DC | PRN
  Administered 2023-04-09: 10 mg via INTRAVENOUS

## 2023-04-09 MED ORDER — SUGAMMADEX SODIUM 200 MG/2ML IV SOLN
INTRAVENOUS | Status: DC | PRN
  Administered 2023-04-09: 400 mg via INTRAVENOUS

## 2023-04-09 MED ORDER — DEXAMETHASONE SODIUM PHOSPHATE 10 MG/ML IJ SOLN
INTRAMUSCULAR | Status: DC | PRN
  Administered 2023-04-09: 5 mg via INTRAVENOUS

## 2023-04-09 MED ORDER — CEFAZOLIN SODIUM-DEXTROSE 2-4 GM/100ML-% IV SOLN
INTRAVENOUS | Status: AC
  Filled 2023-04-09: qty 100

## 2023-04-09 MED ORDER — IOHEXOL 300 MG/ML  SOLN
150.0000 mL | Freq: Once | INTRAMUSCULAR | Status: AC | PRN
  Administered 2023-04-09: 70 mL via INTRA_ARTERIAL

## 2023-04-09 MED ORDER — CHLORHEXIDINE GLUCONATE 0.12 % MT SOLN
OROMUCOSAL | Status: AC
  Administered 2023-04-09: 15 mL via OROMUCOSAL
  Filled 2023-04-09: qty 15

## 2023-04-09 MED ORDER — MIDAZOLAM HCL 2 MG/2ML IJ SOLN: 0.5000 mg | Freq: Once | INTRAMUSCULAR | Status: DC | PRN

## 2023-04-09 MED ORDER — CHLORHEXIDINE GLUCONATE 0.12 % MT SOLN: 15.0000 mL | Freq: Once | OROMUCOSAL | Status: AC

## 2023-04-09 MED ORDER — VERAPAMIL HCL 2.5 MG/ML IV SOLN
INTRAVENOUS | Status: AC | PRN
Start: 2023-04-09 — End: 2023-04-09
  Administered 2023-04-09 (×2): 5 mg via INTRA_ARTERIAL

## 2023-04-09 MED ORDER — ONDANSETRON HCL 4 MG/2ML IJ SOLN
INTRAMUSCULAR | Status: DC | PRN
  Administered 2023-04-09: 4 mg via INTRAVENOUS

## 2023-04-09 MED ORDER — FENTANYL CITRATE (PF) 250 MCG/5ML IJ SOLN
INTRAMUSCULAR | Status: DC | PRN
  Administered 2023-04-09: 100 ug via INTRAVENOUS

## 2023-04-09 NOTE — Anesthesia Preprocedure Evaluation (Signed)
Anesthesia Evaluation  Patient identified by MRN, date of birth, ID band Patient awake    Reviewed: Allergy & Precautions, NPO status , Patient's Chart, lab work & pertinent test results  History of Anesthesia Complications Negative for: history of anesthetic complications  Airway Mallampati: II  TM Distance: >3 FB Neck ROM: Full    Dental  (+) Dental Advisory Given   Pulmonary sleep apnea and Continuous Positive Airway Pressure Ventilation    breath sounds clear to auscultation       Cardiovascular hypertension, Pt. on medications + dysrhythmias Atrial Fibrillation  Rhythm:Regular Rate:Normal  04/07/2023 ECHO: EF 60 to 65%.  1. The LV has normal function, no regional wall motion abnormalities. There is mild concentric LVH.  Grade I diastolic dysfunction (impaired relaxation).   2. RVF is normal. The right ventricular size is normal. There is normal pulmonary artery systolic pressure. The estimated right ventricular systolic pressure is 17.0 mmHg.   3. The mitral valve is normal in structure. No evidence of mitral valve regurgitation.   4. The aortic valve is normal in structure. Aortic valve regurgitation is not visualized.     Neuro/Psych   Anxiety     TIA   GI/Hepatic Neg liver ROS,GERD  Medicated and Controlled,,  Endo/Other  diabetes, Oral Hypoglycemic Agents  BMI 31.9 Glu 166  Renal/GU negative Renal ROS     Musculoskeletal  (+) Arthritis ,    Abdominal   Peds  Hematology xarelto   Anesthesia Other Findings   Reproductive/Obstetrics                              Anesthesia Physical Anesthesia Plan  ASA: 3  Anesthesia Plan: General   Post-op Pain Management: Ofirmev IV (intra-op)*   Induction: Intravenous  PONV Risk Score and Plan: 2 and Ondansetron and Dexamethasone  Airway Management Planned: Oral ETT  Additional Equipment: Arterial line  Intra-op Plan:   Post-operative  Plan: Extubation in OR  Informed Consent: I have reviewed the patients History and Physical, chart, labs and discussed the procedure including the risks, benefits and alternatives for the proposed anesthesia with the patient or authorized representative who has indicated his/her understanding and acceptance.     Dental advisory given  Plan Discussed with: CRNA and Surgeon  Anesthesia Plan Comments:          Anesthesia Quick Evaluation

## 2023-04-09 NOTE — Sedation Documentation (Signed)
Pressure held at groin site for 2 minutes. Clean dry intact. No hematoma

## 2023-04-09 NOTE — Progress Notes (Signed)
Patient s/p diagnostic cerebral angiogram today with Dr. Tommie Sams. Procedure demonstrated mild stenosis of the proximal basilar artery, significantly improved from prior CT angiogram. Therefore, no angioplasty or stenting was performed. Patient will be admitted to a progressive bed following his PACU recovery.   Patient assessed in PACU. He was sleepy but easily arousable and conversational. Right groin vascular site clean and soft but with some tenderness to palpation. Left a-line site is bleeding the nurse is currently holding pressure.   No further follow up with Dr. Tommie Sams planned but she did strongly recommend that he follow up with Neurology as an outpatient.   Alwyn Ren, [place] [phone] 04/09/2023, 3:56 PM

## 2023-04-09 NOTE — Progress Notes (Addendum)
STROKE TEAM PROGRESS NOTE   BRIEF HPI [name] is a 57 y.o. male with history of OSA (CPAP compliant), hypertension, hyperlipidemia, T2DM, and atrial fibrillation on Xarelto nightly who presents with a 5-day history of discoordination, ataxia and slight bilateral upper extremity weakness.  Yesterday, patient could not play golf as he typically would. Of note patient had 5 days of similar symptoms 6 to 7 weeks ago.  His wife had him come to the emergency department.  MRI showed acute/subacute nonhemorrhagic right pontine infarct.  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  Awaiting echo.  Will need cerebral angiogram to determine if he is a candidate for stenting/coiling.  Last Xarelto p.m. of 9/7 - will need to postpone procedure until Tuesday, September 10 at the earliest.  Currently in NSR.   SIGNIFICANT HOSPITAL EVENTS 9/7: Presented.  MRI: Acute pontine stroke. 9/8: DVT prophylaxis: Xarelto discontinued in favor of heparin with eye towards cerebral angiogram 9/11.  9/9: Continued ataxia.  Agreed to statin use. 9/10: Began simvastatin 40.  Brilinta loading dose and p.m. for imaging +/- stent tomorrow.  On aspirin and heparin. 9/11 DSA Mild stenosis of the proximal basilar artery, significantly improved from prior CT angiogram. Therefore, no angioplasty or stenting was performed.   INTERIM HISTORY/SUBJECTIVE Patient had diagnostic cerebral catheter angiogram today which surprisingly shows only mild proximal and mid basilar stenosis suggesting that he likely had a superimposed clot which has since resolved after IV heparin was started.  His neurological exam is stable he has no complaints.    OBJECTIVE  CBC    Component Value Date/Time   WBC 6.0 04/09/2023 0633   RBC 5.30 04/09/2023 0633   HGB 15.1 04/09/2023 0633   HCT 43.8 04/09/2023 0633   PLT 153 04/09/2023 0633   MCV 82.6 04/09/2023  0633   MCH 28.5 04/09/2023 0633   MCHC 34.5 04/09/2023 0633   RDW 13.3 04/09/2023 0633   LYMPHSABS 1.3 06/06/2016 0849   MONOABS 0.4 06/06/2016 0849   EOSABS 0.1 06/06/2016 0849   BASOSABS 0.1 06/06/2016 0849    BMET    Component Value Date/Time   NA 139 04/09/2023 0633   K 3.5 04/09/2023 0633   CL 104 04/09/2023 0633   CO2 21 (L) 04/09/2023 0633   GLUCOSE 189 (H) 04/09/2023 0633   BUN 10 04/09/2023 0633   CREATININE 1.18 04/09/2023 0633   CALCIUM 9.0 04/09/2023 0633   GFRNONAA >60 04/09/2023 8295    IMAGING past 24 hours No results found.  Vitals:   04/09/23 1430 04/09/23 1445 04/09/23 1500 04/09/23 1538  BP: (!) 125/58 (!) 121/58 (!) 120/57 131/66  Pulse: (!) 55 (!) 55 (!) 55 (!) 55  Resp: 13 13 16 17   Temp:   98.4 F (36.9 C) 99 F (37.2 C)  TempSrc:    Oral  SpO2: 96% 93% 93% 98%  Weight:      Height:         PHYSICAL EXAM General:  Alert, well-nourished, well-developed patient in no acute distress Psych:  Mood and affect appropriate for situation CV: Regular rate and rhythm on monitor Respiratory:  Regular, unlabored respirations on room air GI: Abdomen soft and nontender   NEURO:  Mental Status: AA&Ox3, patient is able to give clear and coherent history Speech/Language: speech is without dysarthria or aphasia.  Naming, repetition, fluency, and comprehension intact.  Cranial Nerves:  II: Visual fields full.  III, IV, VI: EOMI. Saccadic dysmetria on horizontal gaze left greater than right, continues to improve today 9/10. VII: Smile symmetrical VIII: hearing intact to voice. IX, X:Phonation is normal.  Motor:Grossy normal Tone: is normal and bulk is normal Coordination: FTN intact bilaterally Gait: deferred   ASSESSMENT/PLAN  Acute pontine ischemic infarct in the setting of high-grade basilar artery stenosis.  He also has old left paramedian pontine infarct in the same vascular distribution  Etiology: Patient had high-grade stenosis in the  proximal basilar artery.  This is the likely source of patient's repeated brainstem infarct however on diagnostic catheter angiogram it appeared to have resolved suggesting that there is likely superimposed clot on mild underlying stenosis which resolved on IV heparin.  Diagnostic angiogram mild proximal basilar stenosis significantly improved from CT angiogram    Code Stroke CT head: Infarct in central pons ?Chronic CTA head & neck: High-grade stenosis of the proximal basilar artery.  Fetal-type right PCA. MRI acute/subacute nonhemorrhagic right pontine infarct.  Remote left paramedian pontine infarct.  Remote nonhemorrhagic infarct of right lentiform nucleus.  Remote lacunar infarct of left lateral cerebellum. 2D Echo Ef 60-65%. LV mild concentric hypertrophy, Grade I diastolic dysfunction  DSA 9/11 Mild stenosis of the proximal basilar artery, significantly improved from prior CT angiogram. Therefore, no angioplasty or stenting was performed.  LDL 97 (131 3 months ago) [id number] 8.0 VTE prophylaxis -IV heparin from home Xarelto, will likely discharge on Eliquis  Therapy recommendations: PT: Outpatient follow-up, OT: Awaiting recs Disposition: Home with wife  Atrial fibrillation Home Meds: Xarelto 20 nightly Continue telemetry monitoring Xarelto switched to Eliquis at discharge.  Patient is considering possible participation in  Hypertension Home meds: Lisinopril 20, continued Unstable, sBPs 160-180s. Will allow this in setting of basilar stenosis -- real danger of hypoperfusion/further stroke if we treat his HTN too aggressively Blood Pressure Goal: BP less than 180/105   Hyperlipidemia:  Home meds: None LDL 97, goal < 70 Started simvastatin 40, can switch to Repatha injection outpatient as patient has not tolerated statins (atorvastatin/rosuvastatin) in the past (memory issues)  Other Active Problems 1.8 cm hypodense nodule in the right lobe of the thyroid: Follow-up outpatient for [place]  +/- FNA  Diabetes type II uncontrolled Home meds: Metformin 1000 mg daily, Farxiga 10 mg HgbA1c 8.0, goal < 7.0, glucoses 150s-200s CBGs Very sensitive sliding scale insulin Recommend close follow-up with PCP for better DM control  Substance Abuse Patient uses alcohol Discuss willingness to abstain from alcohol, assess stage of change  Other Stroke Risk Factors Obesity, Body mass index is 31.87 kg/m., BMI >/= 30 associated with increased stroke risk, recommend weight loss, diet and exercise as appropriate  Current alcohol use Obstructive sleep apnea, on CPAP at home   I have personally obtained history,examined this patient, reviewed notes, independently viewed imaging studies, participated in medical decision making and plan of care.ROS completed by me personally and pertinent positives fully documented  I have made any additions or clarifications directly to the above note. Agree with note above.  Diagnostic cerebral catheter angiogram interestingly showed only mild basilar stenosis the previously seen high-grade stenosis and the CT angiogram is resolved suggesting that it was likely a superimposed clot which has resolved with IV heparin.  Patient was on Xarelto prior to admission hence would recommend switching to Eliquis 5 mg twice daily instead.  No need for aspirin or Brilinta since patient did not get a stent.  Maintain aggressive risk factor modification.  Patient may also consider possible  participation in the [place] AF trial ( eliquis versus Milvexian-factor XI inhibitor) and was given written information to take home and review and decide.  Follow-up with outpatient stroke clinic.  Discussed with patient, wife and, Dr Sherlon Handing and  Dr. Mahala Menghini.  Greater than 50% time during this 35-minute visit was spent on counseling and coordination of care and discussion with patient and wife and care team and answering questions.  Delia Heady, MD Medical Director [facility] Pager: [phone] 04/09/2023 4:42 PM

## 2023-04-09 NOTE — Discharge Summary (Signed)
Physician Discharge Summary  Charles Charles Phelps BJY:[id number] DOB: 1966-04-03 DOA: 04/05/2023  PCP: Charles Schwalbe, MD  Admit date: 04/05/2023 Discharge date: 04/09/2023  Time spent: 40 minutes  Recommendations for Outpatient Follow-up:  Needs outpatient follow-up for clinical trial under Dr Pearlean Brownie Requires lipid panel A1c in 3 months, consider screening labs soon May need Repatha as an outpatient and switch off of atorvastatin He does require outpatient follow-up for 1.8 cm hypodense nodule in the right lobe of thyroid by PCP once he stabilizes   Discharge Diagnoses:  MAIN problem for hospitalization   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 Underlying A-fib Poorly controlled blood pressure DM TY 2 Ethanol abuse  Please see below for itemized issues addressed in HOpsital- refer to other progress notes for clarity if needed  Discharge Condition: Good  Diet recommendation: Heart healthy diabetic  Filed Weights   04/05/23 1155 04/09/23 1042  Weight: 115.7 kg 115.7 kg    History of present illness:  57 year old known OSA.male CPAP HTN HLD A-fib CHADVASC >4 on Xarelto, BMI of 31 Presented on 9/7 with ataxia for 5 days had this 6 to 7 weeks previously as well-he is resistant to various meds including statins and has not been on them He tried to play golf day prior to admission on 9/6 and was feeling dizzy and in coordinate When he was brought to the ER 9/7 he was found to have a sizable pontine infarct--his meds were switched to heparin 9/8 interventional radiology was consulted because of basilar artery stenosis and he was loaded with Brilinta and aspirin 81 evening before procedure 9/11 patient underwent diagnostic cerebral angiogram showing 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 and this was accessed through the [facility]-neurology felt that there could have been a small superimposed clot seen that resolved with initiation of IV heparin It  was felt postprocedure on 9/11 that he was stabilized and as there was no specific anatomic abnormality he did not need stenting and hence he was placed on Eliquis instead of Xarelto given preference for the same and feasibility of the same He will need follow-up with the neurologist for participation in a trial Please note he will also need follow-up outpatient ultrasound  I would all his meds without change except for switching his Xarelto to Eliquis twice daily and he was given a coupon card for this   Discharge Exam: Vitals:   04/09/23 1500 04/09/23 1538  BP: (!) 120/57 131/66  Pulse: (!) 55 (!) 55  Resp: 16 17  Temp: 98.4 F (36.9 C) 99 F (37.2 C)  SpO2: 93% 98%    Subj on day of d/c   Looks well feels well ambulatory  General Exam on discharge  EOMI NCAT no focal deficit, moving all 4 limbs equally, power 5/5,  Discharge Instructions   Discharge Instructions     Ambulatory referral to Physical Therapy   Complete by: As directed    Diet - low sodium heart healthy   Complete by: As directed    Discharge instructions   Complete by: As directed    Take medications as prescribed including lifelong eliquis, and statin  Talk to Dr. Pearlean Brownie about stativ drugs as an outpatient--he can also talk you you about the trial.   See him in 6 weeks   Increase activity slowly   Complete by: As directed    No wound care   Complete by: As directed       Allergies as of 04/09/2023  Reactions   Atorvastatin Other (See Comments)   Memory problems        Medication List     STOP taking these medications    Xarelto 20 MG Tabs tablet Generic drug: rivaroxaban       TAKE these medications    atorvastatin 40 MG tablet Commonly known as: Lipitor Take 1 tablet (40 mg total) by mouth daily.   cetirizine 10 MG tablet Commonly known as: ZYRTEC Take 10 mg by mouth daily.   citalopram 20 MG tablet Commonly known as: CELEXA TAKE 1 TABLET BY MOUTH DAILY    Citrucel 500 MG Tabs Generic drug: Methylcellulose (Laxative) Take 1 tablet by mouth at bedtime.   colchicine 0.6 MG tablet TAKE 1 TABLET BY MOUTH  TWICE DAILY AS NEEDED   cyanocobalamin 1000 MCG tablet Take 1,000 mcg by mouth daily.   diltiazem 120 MG 24 hr capsule Commonly known as: CARDIZEM CD TAKE 1 CAPSULE BY MOUTH DAILY   Eliquis 5 MG Tabs tablet Generic drug: apixaban Take 1 tablet (5 mg total) by mouth 2 (two) times daily.   Farxiga 10 MG Tabs tablet Generic drug: dapagliflozin propanediol TAKE 1 TABLET BY MOUTH DAILY  BEFORE BREAKFAST   hyoscyamine 0.125 MG tablet Commonly known as: LEVSIN Take 1 tablet (0.125 mg total) by mouth 3 (three) times daily as needed. What changed: when to take this   lisinopril 20 MG tablet Commonly known as: ZESTRIL TAKE 1 TABLET BY MOUTH DAILY   metFORMIN 500 MG 24 hr tablet Commonly known as: GLUCOPHAGE-XR TAKE 2 TABLETS BY MOUTH DAILY  WITH BREAKFAST   multivitamin with minerals Tabs tablet Take 1 tablet by mouth daily.   omeprazole 20 MG capsule Commonly known as: PRILOSEC TAKE 1 CAPSULE BY MOUTH DAILY   OneTouch Delica Lancets 33G Misc Use to obtain blood sugar sample 1-2 times daily.   OneTouch Verio test strip Generic drug: glucose blood Use to check blood sugar 1-2 times daily   valACYclovir 1000 MG tablet Commonly known as: VALTREX TAKE 2 TABLETS BY MOUTH TWICE  DAILY FOR 1 DAY FOR COLD SORE       Allergies  Allergen Reactions   Atorvastatin Other (See Comments)    Memory problems    Follow-up Information     [facility]. Schedule an appointment as soon as possible for a visit.   Specialty: Rehabilitation Contact information: [address] [place] [address] [phone]                 The results of significant diagnostics from this hospitalization (including imaging, microbiology, ancillary and laboratory) are listed below for reference.     Significant Diagnostic Studies: ECHOCARDIOGRAM COMPLETE  Result Date: 04/07/2023    ECHOCARDIOGRAM REPORT   Patient Name:   Charles Charles Phelps Date of Exam: 04/07/2023 Medical Rec #:  [id number]         Height:       75.0 in Accession #:    [id number]        Weight:       255.0 lb Date of Birth:  Jan 26, 1966        BSA:          2.433 m Patient Age:    56 years          BP:           167/86 mmHg Patient Gender: M  HR:           70 bpm. Exam Location:  Inpatient Procedure: 2D Echo, Cardiac Doppler, Color Doppler and Intracardiac            Opacification Agent Indications:    Stroke  History:        Patient has no prior history of Echocardiogram examinations.                 Arrythmias:Atrial Fibrillation; Risk Factors:Hypertension and                 Dyslipidemia.  Sonographer:    Meagan Baucom RDCS, FE, PE Referring Phys: [id number] CLAUDIA CLAIBORNE IMPRESSIONS  1. Left ventricular ejection fraction, by estimation, is 60 to 65%. The left ventricle has normal function. The left ventricle has no regional wall motion abnormalities. There is mild concentric left ventricular hypertrophy. Left ventricular diastolic parameters are consistent with Grade I diastolic dysfunction (impaired relaxation).  2. Right ventricular systolic function is normal. The right ventricular size is normal. There is normal pulmonary artery systolic pressure. The estimated right ventricular systolic pressure is 17.0 mmHg.  3. The mitral valve is normal in structure. No evidence of mitral valve regurgitation.  4. The aortic valve is normal in structure. Aortic valve regurgitation is not visualized.  5. The inferior vena cava is normal in size with greater than 50% respiratory variability, suggesting right atrial pressure of 3 mmHg. Conclusion(s)/Recommendation(s): IAS not well visualized. Consider a limited bubble study if cardioembolic source is suspected. FINDINGS  Left Ventricle: Left ventricular ejection fraction, by estimation,  is 60 to 65%. The left ventricle has normal function. The left ventricle has no regional wall motion abnormalities. The left ventricular internal cavity size was normal in size. There is  mild concentric left ventricular hypertrophy. Left ventricular diastolic parameters are consistent with Grade I diastolic dysfunction (impaired relaxation). Right Ventricle: The right ventricular size is normal. Right ventricular systolic function is normal. There is normal pulmonary artery systolic pressure. The tricuspid regurgitant velocity is 1.87 m/s, and with an assumed right atrial pressure of 3 mmHg,  the estimated right ventricular systolic pressure is 17.0 mmHg. Left Atrium: Left atrial size was normal in size. Right Atrium: Right atrial size was normal in size. Pericardium: There is no evidence of pericardial effusion. Mitral Valve: The mitral valve is normal in structure. No evidence of mitral valve regurgitation. Tricuspid Valve: The tricuspid valve is normal in structure. Tricuspid valve regurgitation is trivial. Aortic Valve: The aortic valve is normal in structure. Aortic valve regurgitation is not visualized. Aortic valve mean gradient measures 3.0 mmHg. Aortic valve peak gradient measures 6.2 mmHg. Aortic valve area, by VTI measures 3.26 cm. Pulmonic Valve: Pulmonic valve regurgitation is not visualized. Aorta: The aortic root and ascending aorta are structurally normal, with no evidence of dilitation. Venous: The inferior vena cava is normal in size with greater than 50% respiratory variability, suggesting right atrial pressure of 3 mmHg. IAS/Shunts: The interatrial septum was not well visualized.  LEFT VENTRICLE PLAX 2D LVIDd:         4.50 cm   Diastology LVIDs:         2.90 cm   LV e' medial:    5.11 cm/s LV PW:         1.30 cm   LV E/e' medial:  11.9 LV IVS:        1.30 cm   LV e' lateral:   6.64 cm/s LVOT diam:     2.30  cm   LV E/e' lateral: 9.2 LV SV:         93 LV SV Index:   38 LVOT Area:     4.15 cm   RIGHT VENTRICLE RV S prime:     10.10 cm/s TAPSE (M-mode): 2.0 cm LEFT ATRIUM             Index        RIGHT ATRIUM           Index LA diam:        4.60 cm 1.89 cm/m   RA Area:     11.30 cm LA Vol (A2C):   65.5 ml 26.92 ml/m  RA Volume:   21.40 ml  8.80 ml/m LA Vol (A4C):   60.7 ml 24.95 ml/m LA Biplane Vol: 63.5 ml 26.10 ml/m  AORTIC VALVE AV Area (Vmax):    3.55 cm AV Area (Vmean):   3.32 cm AV Area (VTI):     3.26 cm AV Vmax:           124.00 cm/s AV Vmean:          83.500 cm/s AV VTI:            0.287 m AV Peak Grad:      6.2 mmHg AV Mean Grad:      3.0 mmHg LVOT Vmax:         106.00 cm/s LVOT Vmean:        66.800 cm/s LVOT VTI:          0.225 m LVOT/AV VTI ratio: 0.78  AORTA Ao Root diam: 3.70 cm MITRAL VALVE               TRICUSPID VALVE MV Area (PHT): 2.46 cm    TR Peak grad:   14.0 mmHg MV Decel Time: 308 msec    TR Vmax:        187.00 cm/s MV E velocity: 60.80 cm/s MV A velocity: 74.60 cm/s  SHUNTS MV E/A ratio:  0.82        Systemic VTI:  0.22 m                            Systemic Diam: 2.30 cm Carolan Clines Electronically signed by Carolan Clines Signature Date/Time: 04/07/2023/12:22:10 PM    Final    CT ANGIO HEAD NECK W WO CM  Result Date: 04/05/2023 CLINICAL DATA:  Abnormal balance. Abnormal CT head. Pontine infarct. EXAM: CT ANGIOGRAPHY HEAD AND NECK WITH AND WITHOUT CONTRAST TECHNIQUE: Multidetector CT imaging of the head and neck was performed using the standard protocol during bolus administration of intravenous contrast. Multiplanar CT image reconstructions and MIPs were obtained to evaluate the vascular anatomy. Carotid stenosis measurements (when applicable) are obtained utilizing NASCET criteria, using the distal internal carotid diameter as the denominator. RADIATION DOSE REDUCTION: This exam was performed according to the departmental dose-optimization program which includes automated exposure control, adjustment of the mA and/or kV according to patient size and/or use of iterative  reconstruction technique. CONTRAST:  60mL OMNIPAQUE IOHEXOL 300 MG/ML  SOLN COMPARISON:  None Available. FINDINGS: CTA NECK FINDINGS Aortic arch: A 3 vessel arch configuration is present. Minimal calcification present in the distal arch. No significant stenosis or aneurysm is present. Right carotid system: The right common carotid artery is within normal limits. The bifurcation is unremarkable. The cervical right ICA is normal. Left carotid system: The left common carotid artery is within normal  limits. Minimal calcifications present bifurcation without significant stenosis. The cervical left ICA is normal. Vertebral arteries: The left vertebral artery is the dominant vessel. Both vertebral arteries originate from the vertebral arteries without significant stenosis. No significant stenosis is present in either vertebral artery in the neck. Skeleton: The vertebral body heights and alignment are normal. No focal osseous lesions are present. Other neck: A 1.8 cm hypodense nodule is present within the right lobe of the thyroid. No other thyroid lesions are present. The submandibular and parotid glands and ducts are within normal limits. No significant adenopathy is present. Upper chest: The lung apices are clear. The thoracic inlet is within normal limits. Review of the MIP images confirms the above findings CTA HEAD FINDINGS Anterior circulation: Minimal calcification is present in the cavernous internal carotid arteries bilaterally. No significant stenosis is present through the ICA termini. The A1 and M1 segments are normal. The anterior communicating artery is patent. MCA bifurcations within normal limits bilaterally. The ACA and MCA branch vessels are normal. Posterior circulation: The left vertebral artery is the dominant vessel. The right vertebral artery is centrally terminates at the PICA. Left PICA origin is visualized and normal. A high-grade stenosis is present in the proximal basilar artery. The more distal  basilar artery is small. Both posterior scratched at both superior cerebellar arteries are patent. The left posterior cerebral artery originates from basilar tip. The right PCA is of fetal type. The PCA branch vessels are within normal limits bilaterally. Venous sinuses: The dural sinuses are patent. The straight sinus and deep cerebral veins are intact. Cortical veins are within normal limits. No significant vascular malformation is evident. Anatomic variants: Fetal type right posterior cerebral artery. Review of the MIP images confirms the above findings IMPRESSION: 1. 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 This likely contributes to the brainstem infarct. 2. The more distal basilar artery is small. 3. Fetal type right posterior cerebral artery. 4. 1.8 cm hypodense nodule within the right lobe of the thyroid. Recommend non-emergent thyroid ultrasound. Reference: J Am Coll Radiol. 2015 Feb;12(2): 143-50 Electronically Signed   By: Marin Roberts M.D.   On: 04/05/2023 18:45   MR BRAIN W WO CONTRAST  Result Date: 04/05/2023 CLINICAL DATA:  Patient feeling off balance.  Abnormal head CT. EXAM: MRI HEAD WITHOUT AND WITH CONTRAST TECHNIQUE: Multiplanar, multiecho pulse sequences of the brain and surrounding structures were obtained without and with intravenous contrast. CONTRAST:  10mL GADAVIST GADOBUTROL 1 MMOL/ML IV SOLN COMPARISON:  CT head without contrast 04/05/2023 FINDINGS: Brain: The diffusion-weighted images demonstrate acute/subacute nonhemorrhagic right pontine infarct. The left paramedian pontine infarct is remote. A remote nonhemorrhagic infarct is present in the right lentiform nucleus. No additional infarcts are present. No significant white matter lesions are present. Deep brain nuclei are within normal limits. The ventricles are of normal size. A remote lacunar infarct is present in the lateral left cerebellum. Vascular: Flow is present in the major intracranial arteries. Skull and  upper cervical spine: The craniocervical junction is normal. Upper cervical spine is within normal limits. Marrow signal is unremarkable. Sinuses/Orbits: The paranasal sinuses and mastoid air cells are clear. The globes and orbits are within normal limits. IMPRESSION: 1. Acute/subacute nonhemorrhagic right pontine infarct. 2. Remote left paramedian pontine infarct. 3. Remote nonhemorrhagic infarct of the right lentiform nucleus. 4. Remote lacunar infarct of the lateral left cerebellum. These results were called by telephone at the time of interpretation on 04/05/2023 at 6:35 pm to provider KEN LE , who verbally  acknowledged these results. Electronically Signed   By: Marin Roberts M.D.   On: 04/05/2023 18:35   CT Head Wo Contrast  Result Date: 04/05/2023 CLINICAL DATA:  Headache, increasing frequency or severity EXAM: CT HEAD WITHOUT CONTRAST TECHNIQUE: Contiguous axial images were obtained from the base of the skull through the vertex without intravenous contrast. RADIATION DOSE REDUCTION: This exam was performed according to the departmental dose-optimization program which includes automated exposure control, adjustment of the mA and/or kV according to patient size and/or use of iterative reconstruction technique. COMPARISON:  None Available. FINDINGS: Brain: No evidence of acute infarction, hemorrhage, hydrocephalus, extra-axial collection or mass lesion/mass effect. There is an age indeterminate, but likely chronic infarct in the central pons. Vascular: No hyperdense vessel or unexpected calcification. Skull: Normal. Negative for fracture or focal lesion. Sinuses/Orbits: No middle ear mastoid effusion. Paranasal sinuses are clear. Orbits are unremarkable. Other: None. IMPRESSION: 1. Age indeterminate, but likely chronic infarct in the central pons. If there is clinical concern for acute infarct, consider MRI for further evaluation. 2. Otherwise no specific CT etiology for headaches identified  Electronically Signed   By: Lorenza Cambridge M.D.   On: 04/05/2023 13:38    Microbiology: Recent Results (from the past 240 hour(s))  SARS Coronavirus 2 by RT PCR (hospital order, performed in [facility] hospital lab) *cepheid single result test* Anterior Nasal Swab     Status: None   Collection Time: 04/05/23 12:16 PM   Specimen: Anterior Nasal Swab  Result Value Ref Range Status   SARS Coronavirus 2 by RT PCR NEGATIVE NEGATIVE Final    Comment: Performed at [facility], [address], [place], [place] [address]     Labs: Basic Metabolic Panel: Recent Labs  Lab 04/05/23 1201 04/06/23 0100 04/07/23 0816 04/09/23 0633  NA 135 140 139 139  K 3.7 3.0* 3.9 3.5  CL 102 104 103 104  CO2 19* 24 27 21*  GLUCOSE 299* 142* 179* 189*  BUN 14 13 15 10   CREATININE 1.63* 1.32* 1.37* 1.18  CALCIUM 8.8* 8.7* 8.8* 9.0  MG  --  1.8 2.0  --    Liver Function Tests: Recent Labs  Lab 04/05/23 1201 04/07/23 0816  AST 32 24  ALT 32 30  ALKPHOS 74 61  BILITOT 1.2 1.1  PROT 6.7 6.2*  ALBUMIN 3.9 3.6   No results for input(s): "LIPASE", "AMYLASE" in the last 168 hours. No results for input(s): "AMMONIA" in the last 168 hours. CBC: Recent Labs  Lab 04/05/23 1201 04/06/23 0100 04/08/23 0631 04/09/23 0633  WBC 7.4 7.1 5.7 6.0  HGB 14.8 14.1 14.2 15.1  HCT 42.7 40.6 40.9 43.8  MCV 82.8 85.3 83.8 82.6  PLT 173 155 139* 153   Cardiac Enzymes: No results for input(s): "CKTOTAL", "CKMB", "CKMBINDEX", "TROPONINI" in the last 168 hours. BNP: BNP (last 3 results) No results for input(s): "BNP" in the last 8760 hours.  ProBNP (last 3 results) No results for input(s): "PROBNP" in the last 8760 hours.  CBG: Recent Labs  Lab 04/08/23 2108 04/09/23 0639 04/09/23 1043 04/09/23 1345 04/09/23 1535  GLUCAP 154* 199* 166* 167* 242*       Signed:  Rhetta Mura MD   [facility] 04/09/2023, 6:47 PM

## 2023-04-09 NOTE — Progress Notes (Signed)
   04/09/23 0037  BiPAP/CPAP/SIPAP  $ Non-Invasive Home Ventilator  Subsequent  BiPAP/CPAP/SIPAP Pt Type Adult  BiPAP/CPAP/SIPAP DREAMSTATIOND  Patient Home Equipment Yes  Safety Check Completed by RT for Home Unit Yes, no issues noted   Pt comfortably asleep on home cpap unit

## 2023-04-09 NOTE — Transfer of Care (Signed)
Immediate Anesthesia Transfer of Care Note  Patient: Charles Phelps  Procedure(s) Performed: IR WITH ANESTHESIA  Patient Location: PACU  Anesthesia Type:General  Level of Consciousness: awake, alert , oriented, and patient cooperative  Airway & Oxygen Therapy: Patient Spontanous Breathing and Patient connected to nasal cannula oxygen  Post-op Assessment: Report given to RN, Post -op Vital signs reviewed and stable, and Patient moving all extremities  Post vital signs: Reviewed and stable  Last Vitals:  Vitals Value Taken Time  BP 122/54 04/09/23 1341  Temp    Pulse 50 04/09/23 1344  Resp 15 04/09/23 1344  SpO2 95 % 04/09/23 1344  Vitals shown include unfiled device data.  Last Pain:  Vitals:   04/09/23 1134  TempSrc:   PainSc: 0-No pain         Complications: No notable events documented.

## 2023-04-09 NOTE — Anesthesia Procedure Notes (Signed)
Arterial Line Insertion Start/End9/05/2023 11:10 AM, 04/09/2023 11:15 AM Performed by: Waynard Edwards, CRNA, CRNA  Patient location: Pre-op. Preanesthetic checklist: patient identified, IV checked, site marked, risks and benefits discussed, surgical consent, monitors and equipment checked, pre-op evaluation, timeout performed and anesthesia consent Lidocaine 1% used for infiltration Left, radial was placed Catheter size: 20 G Hand hygiene performed , maximum sterile barriers used  and Seldinger technique used Allen's test indicative of satisfactory collateral circulation Attempts: 1 Procedure performed without using ultrasound guided technique. Following insertion, dressing applied and Biopatch. Post procedure assessment: normal and unchanged  Patient tolerated the procedure well with no immediate complications.

## 2023-04-09 NOTE — H&P (Signed)
Referring Physician(s): Lina Sayre  Supervising Physician: Baldemar Lenis  Patient Status:  [facility] - In-pt  Chief Complaint:  Basilar artery stenosis   Subjective:  Scheduled for Basilar artery angioplasty\/stent placement with Dr Tommi Rumps Melchor Amour in NIR  Allergies: Atorvastatin  Medications: Prior to Admission medications   Medication Sig Start Date End Date Taking? Authorizing Provider  cetirizine (ZYRTEC) 10 MG tablet Take 10 mg by mouth daily.   Yes [provider]  citalopram (CELEXA) 20 MG tablet TAKE 1 TABLET BY MOUTH DAILY 07/30/22  Yes Tillman Abide I, MD  colchicine 0.6 MG tablet TAKE 1 TABLET BY MOUTH  TWICE DAILY AS NEEDED 04/03/21  Yes [name], MD  cyanocobalamin 1000 MCG tablet Take 1,000 mcg by mouth daily.   Yes [provider]  dapagliflozin propanediol (FARXIGA) 10 MG TABS tablet Take 1 tablet (10 mg total) by mouth daily before breakfast. 05/28/22  Yes [name], MD  diltiazem (CARDIZEM CD) 120 MG 24 hr capsule TAKE 1 CAPSULE BY MOUTH DAILY 07/30/22  Yes [name], MD  hyoscyamine (LEVSIN) 0.125 MG tablet Take 1 tablet (0.125 mg total) by mouth 3 (three) times daily as needed. Patient taking differently: Take 0.125 mg by mouth daily. 12/19/22  Yes Tillman Abide I, MD  lisinopril (ZESTRIL) 20 MG tablet TAKE 1 TABLET BY MOUTH DAILY 07/30/22  Yes Tillman Abide I, MD  metFORMIN (GLUCOPHAGE-XR) 500 MG 24 hr tablet TAKE 2 TABLETS BY MOUTH DAILY  WITH BREAKFAST 08/29/22  Yes [name], MD  Methylcellulose, Laxative, (CITRUCEL) 500 MG TABS Take 1 tablet by mouth at bedtime.   Yes [provider]  Multiple Vitamin (MULTIVITAMIN WITH MINERALS) TABS tablet Take 1 tablet by mouth daily.   Yes [provider]  omeprazole (PRILOSEC) 20 MG capsule TAKE 1 CAPSULE BY MOUTH DAILY 07/30/22  Yes Tillman Abide I, MD  valACYclovir (VALTREX) 1000 MG tablet TAKE 2 TABLETS BY MOUTH TWICE  DAILY FOR 1 DAY  FOR COLD SORE 01/20/23  Yes [name], MD  XARELTO 20 MG TABS tablet TAKE 1 TABLET BY MOUTH DAILY  WITH SUPPER 05/23/22  Yes Tillman Abide I, MD  glucose blood (ONETOUCH VERIO) test strip Use to check blood sugar 1-2 times daily 12/19/22   [name], MD  OneTouch Delica Lancets 33G MISC Use to obtain blood sugar sample 1-2 times daily. 12/19/22   [name], MD     Vital Signs: BP (!) 165/92 (BP Location: Right Arm)   Pulse 62   Temp 98 F (36.7 C) (Oral)   Resp 19   Ht 6\' 3"  (1.905 m)   Wt 255 lb (115.7 kg)   SpO2 100%   BMI 31.87 kg/m   Physical Exam Vitals reviewed.  HENT:     Mouth/Throat:     Mouth: Mucous membranes are moist.  Eyes:     Extraocular Movements: Extraocular movements intact.  Cardiovascular:     Rate and Rhythm: Normal rate and regular rhythm.     Heart sounds: No murmur heard. Pulmonary:     Breath sounds: No wheezing.  Abdominal:     Palpations: Abdomen is soft.     Tenderness: There is no abdominal tenderness.  Musculoskeletal:        General: Normal range of motion.     Cervical back: Normal range of motion.  Skin:    General: Skin is warm.  Neurological:     Mental Status: He is  alert and oriented to person, place, and time.  Psychiatric:        Behavior: Behavior normal.     Imaging: ECHOCARDIOGRAM COMPLETE  Result Date: 04/07/2023    ECHOCARDIOGRAM REPORT   Patient Name:   Charles Phelps Date of Exam: 04/07/2023 Medical Rec #:  [id number]         Height:       75.0 in Accession #:    [id number]        Weight:       255.0 lb Date of Birth:  1966/03/03        BSA:          2.433 m Patient Age:    56 years          BP:           167/86 mmHg Patient Gender: M                 HR:           70 bpm. Exam Location:  Inpatient Procedure: 2D Echo, Cardiac Doppler, Color Doppler and Intracardiac            Opacification Agent Indications:    Stroke  History:        Patient has no prior history of Echocardiogram examinations.                  Arrythmias:Atrial Fibrillation; Risk Factors:Hypertension and                 Dyslipidemia.  Sonographer:    Meagan Baucom RDCS, FE, PE Referring Phys: [id number] CLAUDIA CLAIBORNE IMPRESSIONS  1. Left ventricular ejection fraction, by estimation, is 60 to 65%. The left ventricle has normal function. The left ventricle has no regional wall motion abnormalities. There is mild concentric left ventricular hypertrophy. Left ventricular diastolic parameters are consistent with Grade I diastolic dysfunction (impaired relaxation).  2. Right ventricular systolic function is normal. The right ventricular size is normal. There is normal pulmonary artery systolic pressure. The estimated right ventricular systolic pressure is 17.0 mmHg.  3. The mitral valve is normal in structure. No evidence of mitral valve regurgitation.  4. The aortic valve is normal in structure. Aortic valve regurgitation is not visualized.  5. The inferior vena cava is normal in size with greater than 50% respiratory variability, suggesting right atrial pressure of 3 mmHg. Conclusion(s)/Recommendation(s): IAS not well visualized. Consider a limited bubble study if cardioembolic source is suspected. FINDINGS  Left Ventricle: Left ventricular ejection fraction, by estimation, is 60 to 65%. The left ventricle has normal function. The left ventricle has no regional wall motion abnormalities. The left ventricular internal cavity size was normal in size. There is  mild concentric left ventricular hypertrophy. Left ventricular diastolic parameters are consistent with Grade I diastolic dysfunction (impaired relaxation). Right Ventricle: The right ventricular size is normal. Right ventricular systolic function is normal. There is normal pulmonary artery systolic pressure. The tricuspid regurgitant velocity is 1.87 m/s, and with an assumed right atrial pressure of 3 mmHg,  the estimated right ventricular systolic pressure is 17.0 mmHg. Left Atrium: Left atrial  size was normal in size. Right Atrium: Right atrial size was normal in size. Pericardium: There is no evidence of pericardial effusion. Mitral Valve: The mitral valve is normal in structure. No evidence of mitral valve regurgitation. Tricuspid Valve: The tricuspid valve is normal in structure. Tricuspid valve regurgitation is trivial. Aortic Valve: The aortic valve is normal in structure. Aortic  valve regurgitation is not visualized. Aortic valve mean gradient measures 3.0 mmHg. Aortic valve peak gradient measures 6.2 mmHg. Aortic valve area, by VTI measures 3.26 cm. Pulmonic Valve: Pulmonic valve regurgitation is not visualized. Aorta: The aortic root and ascending aorta are structurally normal, with no evidence of dilitation. Venous: The inferior vena cava is normal in size with greater than 50% respiratory variability, suggesting right atrial pressure of 3 mmHg. IAS/Shunts: The interatrial septum was not well visualized.  LEFT VENTRICLE PLAX 2D LVIDd:         4.50 cm   Diastology LVIDs:         2.90 cm   LV e' medial:    5.11 cm/s LV PW:         1.30 cm   LV E/e' medial:  11.9 LV IVS:        1.30 cm   LV e' lateral:   6.64 cm/s LVOT diam:     2.30 cm   LV E/e' lateral: 9.2 LV SV:         93 LV SV Index:   38 LVOT Area:     4.15 cm  RIGHT VENTRICLE RV S prime:     10.10 cm/s TAPSE (M-mode): 2.0 cm LEFT ATRIUM             Index        RIGHT ATRIUM           Index LA diam:        4.60 cm 1.89 cm/m   RA Area:     11.30 cm LA Vol (A2C):   65.5 ml 26.92 ml/m  RA Volume:   21.40 ml  8.80 ml/m LA Vol (A4C):   60.7 ml 24.95 ml/m LA Biplane Vol: 63.5 ml 26.10 ml/m  AORTIC VALVE AV Area (Vmax):    3.55 cm AV Area (Vmean):   3.32 cm AV Area (VTI):     3.26 cm AV Vmax:           124.00 cm/s AV Vmean:          83.500 cm/s AV VTI:            0.287 m AV Peak Grad:      6.2 mmHg AV Mean Grad:      3.0 mmHg LVOT Vmax:         106.00 cm/s LVOT Vmean:        66.800 cm/s LVOT VTI:          0.225 m LVOT/AV VTI ratio: 0.78   AORTA Ao Root diam: 3.70 cm MITRAL VALVE               TRICUSPID VALVE MV Area (PHT): 2.46 cm    TR Peak grad:   14.0 mmHg MV Decel Time: 308 msec    TR Vmax:        187.00 cm/s MV E velocity: 60.80 cm/s MV A velocity: 74.60 cm/s  SHUNTS MV E/A ratio:  0.82        Systemic VTI:  0.22 m                            Systemic Diam: 2.30 cm Carolan Clines Electronically signed by Carolan Clines Signature Date/Time: 04/07/2023/12:22:10 PM    Final    CT ANGIO HEAD NECK W WO CM  Result Date: 04/05/2023 CLINICAL DATA:  Abnormal balance. Abnormal CT head. Pontine infarct. EXAM: CT ANGIOGRAPHY HEAD AND  NECK WITH AND WITHOUT CONTRAST TECHNIQUE: Multidetector CT imaging of the head and neck was performed using the standard protocol during bolus administration of intravenous contrast. Multiplanar CT image reconstructions and MIPs were obtained to evaluate the vascular anatomy. Carotid stenosis measurements (when applicable) are obtained utilizing NASCET criteria, using the distal internal carotid diameter as the denominator. RADIATION DOSE REDUCTION: This exam was performed according to the departmental dose-optimization program which includes automated exposure control, adjustment of the mA and/or kV according to patient size and/or use of iterative reconstruction technique. CONTRAST:  60mL OMNIPAQUE IOHEXOL 300 MG/ML  SOLN COMPARISON:  None Available. FINDINGS: CTA NECK FINDINGS Aortic arch: A 3 vessel arch configuration is present. Minimal calcification present in the distal arch. No significant stenosis or aneurysm is present. Right carotid system: The right common carotid artery is within normal limits. The bifurcation is unremarkable. The cervical right ICA is normal. Left carotid system: The left common carotid artery is within normal limits. Minimal calcifications present bifurcation without significant stenosis. The cervical left ICA is normal. Vertebral arteries: The left vertebral artery is the dominant vessel. Both  vertebral arteries originate from the vertebral arteries without significant stenosis. No significant stenosis is present in either vertebral artery in the neck. Skeleton: The vertebral body heights and alignment are normal. No focal osseous lesions are present. Other neck: A 1.8 cm hypodense nodule is present within the right lobe of the thyroid. No other thyroid lesions are present. The submandibular and parotid glands and ducts are within normal limits. No significant adenopathy is present. Upper chest: The lung apices are clear. The thoracic inlet is within normal limits. Review of the MIP images confirms the above findings CTA HEAD FINDINGS Anterior circulation: Minimal calcification is present in the cavernous internal carotid arteries bilaterally. No significant stenosis is present through the ICA termini. The A1 and M1 segments are normal. The anterior communicating artery is patent. MCA bifurcations within normal limits bilaterally. The ACA and MCA branch vessels are normal. Posterior circulation: The left vertebral artery is the dominant vessel. The right vertebral artery is centrally terminates at the PICA. Left PICA origin is visualized and normal. A high-grade stenosis is present in the proximal basilar artery. The more distal basilar artery is small. Both posterior scratched at both superior cerebellar arteries are patent. The left posterior cerebral artery originates from basilar tip. The right PCA is of fetal type. The PCA branch vessels are within normal limits bilaterally. Venous sinuses: The dural sinuses are patent. The straight sinus and deep cerebral veins are intact. Cortical veins are within normal limits. No significant vascular malformation is evident. Anatomic variants: Fetal type right posterior cerebral artery. Review of the MIP images confirms the above findings IMPRESSION: 1. 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 This likely contributes to the brainstem infarct. 2. The  more distal basilar artery is small. 3. Fetal type right posterior cerebral artery. 4. 1.8 cm hypodense nodule within the right lobe of the thyroid. Recommend non-emergent thyroid ultrasound. Reference: J Am Coll Radiol. 2015 Feb;12(2): 143-50 Electronically Signed   By: Marin Roberts M.D.   On: 04/05/2023 18:45   MR BRAIN W WO CONTRAST  Result Date: 04/05/2023 CLINICAL DATA:  Patient feeling off balance.  Abnormal head CT. EXAM: MRI HEAD WITHOUT AND WITH CONTRAST TECHNIQUE: Multiplanar, multiecho pulse sequences of the brain and surrounding structures were obtained without and with intravenous contrast. CONTRAST:  10mL GADAVIST GADOBUTROL 1 MMOL/ML IV SOLN COMPARISON:  CT head without contrast 04/05/2023 FINDINGS: Brain:  The diffusion-weighted images demonstrate acute/subacute nonhemorrhagic right pontine infarct. The left paramedian pontine infarct is remote. A remote nonhemorrhagic infarct is present in the right lentiform nucleus. No additional infarcts are present. No significant white matter lesions are present. Deep brain nuclei are within normal limits. The ventricles are of normal size. A remote lacunar infarct is present in the lateral left cerebellum. Vascular: Flow is present in the major intracranial arteries. Skull and upper cervical spine: The craniocervical junction is normal. Upper cervical spine is within normal limits. Marrow signal is unremarkable. Sinuses/Orbits: The paranasal sinuses and mastoid air cells are clear. The globes and orbits are within normal limits. IMPRESSION: 1. Acute/subacute nonhemorrhagic right pontine infarct. 2. Remote left paramedian pontine infarct. 3. Remote nonhemorrhagic infarct of the right lentiform nucleus. 4. Remote lacunar infarct of the lateral left cerebellum. These results were called by telephone at the time of interpretation on 04/05/2023 at 6:35 pm to provider KEN LE , who verbally acknowledged these results. Electronically Signed   By: Marin Roberts M.D.   On: 04/05/2023 18:35   CT Head Wo Contrast  Result Date: 04/05/2023 CLINICAL DATA:  Headache, increasing frequency or severity EXAM: CT HEAD WITHOUT CONTRAST TECHNIQUE: Contiguous axial images were obtained from the base of the skull through the vertex without intravenous contrast. RADIATION DOSE REDUCTION: This exam was performed according to the departmental dose-optimization program which includes automated exposure control, adjustment of the mA and/or kV according to patient size and/or use of iterative reconstruction technique. COMPARISON:  None Available. FINDINGS: Brain: No evidence of acute infarction, hemorrhage, hydrocephalus, extra-axial collection or mass lesion/mass effect. There is an age indeterminate, but likely chronic infarct in the central pons. Vascular: No hyperdense vessel or unexpected calcification. Skull: Normal. Negative for fracture or focal lesion. Sinuses/Orbits: No middle ear mastoid effusion. Paranasal sinuses are clear. Orbits are unremarkable. Other: None. IMPRESSION: 1. Age indeterminate, but likely chronic infarct in the central pons. If there is clinical concern for acute infarct, consider MRI for further evaluation. 2. Otherwise no specific CT etiology for headaches identified Electronically Signed   By: Lorenza Cambridge M.D.   On: 04/05/2023 13:38    Labs:  CBC: Recent Labs    12/19/22 0945 04/05/23 1201 04/06/23 0100 04/08/23 0631  WBC 6.8 7.4 7.1 5.7  HGB 15.4 14.8 14.1 14.2  HCT 45.4 42.7 40.6 40.9  PLT 179.0 173 155 139*    COAGS: Recent Labs    04/07/23 0803 04/07/23 1855 04/08/23 0631  APTT 61* 65* 83*    BMP: Recent Labs    12/19/22 0945 04/05/23 1201 04/06/23 0100 04/07/23 0816  NA 139 135 140 139  K 4.2 3.7 3.0* 3.9  CL 101 102 104 103  CO2 28 19* 24 27  GLUCOSE 187* 299* 142* 179*  BUN 21 14 13 15   CALCIUM 9.8 8.8* 8.7* 8.8*  CREATININE 1.42 1.63* 1.32* 1.37*  GFRNONAA  --  49* >60 60*    LIVER FUNCTION  TESTS: Recent Labs    12/19/22 0945 04/05/23 1201 04/07/23 0816  BILITOT 1.0 1.2 1.1  AST 32 32 24  ALT 42 32 30  ALKPHOS 74 74 61  PROT 7.2 6.7 6.2*  ALBUMIN 4.5  4.5 3.9 3.6    Assessment and Plan:  Basilar artery stenosis For Cerebral arteriogram with angioplasty/stent basilar artery stenosis Risks and benefits of cerebral angiogram with intervention were discussed with the patient including, but not limited to bleeding, infection, vascular injury, contrast induced renal failure, stroke or  even death.  This interventional procedure involves the use of X-rays and because of the nature of the planned procedure, it is possible that we will have prolonged use of X-ray fluoroscopy.  Potential radiation risks to you include (but are not limited to) the following: - A slightly elevated risk for cancer  several years later in life. This risk is typically less than 0.5% percent. This risk is low in comparison to the normal incidence of human cancer, which is 33% for women and 50% for men according to the [employer] Society. - Radiation induced injury can include skin redness, resembling a rash, tissue breakdown / ulcers and hair loss (which can be temporary or permanent).   The likelihood of either of these occurring depends on the difficulty of the procedure and whether you are sensitive to radiation due to previous procedures, disease, or genetic conditions.   IF your procedure requires a prolonged use of radiation, you will be notified and given written instructions for further action.  It is your responsibility to monitor the irradiated area for the 2 weeks following the procedure and to notify your physician if you are concerned that you have suffered a radiation induced injury.    All of the patient's questions were answered, patient is agreeable to proceed.  Consent signed and in chart.  Electronically Signed: Robet Leu, PA-C 04/09/2023, 6:45 AM   I spent a total  of 15 Minutes at the the patient's bedside AND on the patient's hospital floor or unit, greater than 50% of which was counseling/coordinating care for Basilar artery stenosis angioplasty/stent

## 2023-04-09 NOTE — Progress Notes (Signed)
ANTICOAGULATION CONSULT NOTE  Pharmacy Consult for heparin Indication: atrial fibrillation  Allergies  Allergen Reactions   Atorvastatin Other (See Comments)    Memory problems    Patient Measurements: Height: 6\' 3"  (190.5 cm) Weight: 115.7 kg (255 lb) IBW/kg (Calculated) : 84.5 Heparin Dosing Weight: 108kg  Vital Signs: Temp: 98.2 F (36.8 C) (09/11 0745) Temp Source: Oral (09/11 0745) BP: 152/81 (09/11 0745) Pulse Rate: 63 (09/11 0745)  Labs: Recent Labs    04/07/23 0803 04/07/23 0816 04/07/23 1855 04/08/23 0631  HGB  --   --   --  14.2  HCT  --   --   --  40.9  PLT  --   --   --  139*  APTT 61*  --  65* 83*  HEPARINUNFRC 0.84*  --   --  0.41  CREATININE  --  1.37*  --   --     Estimated Creatinine Clearance: 80.6 mL/min (A) (by C-G formula based on SCr of 1.37 mg/dL (H)).   Medical History: Past Medical History:  Diagnosis Date   Allergic rhinitis    Anxiety    Arthritis    Atrial fibrillation (HCC)    Diabetes (HCC)    Elevated LFTs 2000   fatty liver   GERD (gastroesophageal reflux disease)    Glucose intolerance (impaired glucose tolerance)    Gout    Hyperlipidemia    Hypertension    IBS (irritable bowel syndrome)    Obstructive sleep apnea    Osteoarthritis     Assessment: 68 YOM presenting with CVA with hx afib on Xarelto PTA, last dose administered 9/7 @2200  in ED. Planing diagnostic angiogram and possible stenting. Pharmacy consulted for heparin.    Heparin level 0.3 is at low end of therapeutic on 1600 units/hr. Heparin held for basal artery stenting 9/11.    Goal of Therapy:   Heparin level 0.3 -0.5 units/ml aPTT 66-85 sec Monitor platelets by anticoagulation protocol: Yes   Plan:  F/u heparin plan after procedure Monitor daily heparin level, CBC, signs/symptoms of bleeding    Alphia Moh, PharmD, BCPS, BCCP Clinical Pharmacist  Please check AMION for all [facility] Pharmacy phone numbers After 10:00 PM, call Main Pharmacy  [phone]

## 2023-04-09 NOTE — Progress Notes (Incomplete)
HOSPITALIST ROUNDING NOTE ELLSWORTH CHARLEBOIS UJW:[id number]  DOB: 1965/08/01  DOA: 04/05/2023  PCP: Tillman Abide I, MD  04/09/2023,7:48 AM   LOS: 4 days      Code Status: Full code   From: Home  current Dispo: Unclear     57 year old male A-fib [place] vas 2 > 4/Xarelto Severe OHSS/OSA on CPAP follows with Dr. Francisco Capuchin in the outpatient HTN BMI 31 HLD DM TY 2 Severe left hip pain status post prior injections Solu-Medrol-follows with Dr. Magnus Ivan Reported disconjugate movements June which resolved after 5 days but he did not tell anyone Admit 9/7 discoordinate movements, could not play golf, ataxia-MRI brain acute pontine stroke CT angio high-grade stenosis proximal basilar, neurology placed on aspirin heparin held Xarelto BP goal less than 180/105 Zocor 40 switched to Repatha as outpatient 1.8 cm hypodense right lobe nodule A1c 8.0  Plan  ***   DVT prophylaxis: ***  Status is: Inpatient {Inpatient:23812}    Subjective: ***  Objective + exam Vitals:   04/08/23 2324 04/09/23 0030 04/09/23 0500 04/09/23 0745  BP: (!) 195/84 (!) 182/90 (!) 165/92 (!) 152/81  Pulse: 97  62 63  Resp: 18  19 18   Temp: 97.7 F (36.5 C)  98 F (36.7 C) 98.2 F (36.8 C)  TempSrc: Oral  Oral Oral  SpO2: 98%  100% 99%  Weight:      Height:       Filed Weights   04/05/23 1155  Weight: 115.7 kg    Examination:    Data Reviewed: reviewed   CBC    Component Value Date/Time   WBC 5.7 04/08/2023 0631   RBC 4.88 04/08/2023 0631   HGB 14.2 04/08/2023 0631   HCT 40.9 04/08/2023 0631   PLT 139 (L) 04/08/2023 0631   MCV 83.8 04/08/2023 0631   MCH 29.1 04/08/2023 0631   MCHC 34.7 04/08/2023 0631   RDW 13.2 04/08/2023 0631   LYMPHSABS 1.3 06/06/2016 0849   MONOABS 0.4 06/06/2016 0849   EOSABS 0.1 06/06/2016 0849   BASOSABS 0.1 06/06/2016 0849      Latest Ref Rng & Units 04/07/2023    8:16 AM 04/06/2023    1:00 AM 04/05/2023   12:01 PM  CMP  Glucose 70 - 99 mg/dL 562  130  865   BUN 6 - 20  mg/dL 15  13  14    Creatinine 0.61 - 1.24 mg/dL 7.84  6.96  2.95   Sodium 135 - 145 mmol/L 139  140  135   Potassium 3.5 - 5.1 mmol/L 3.9  3.0  3.7   Chloride 98 - 111 mmol/L 103  104  102   CO2 22 - 32 mmol/L 27  24  19    Calcium 8.9 - 10.3 mg/dL 8.8  8.7  8.8   Total Protein 6.5 - 8.1 g/dL 6.2   6.7   Total Bilirubin 0.3 - 1.2 mg/dL 1.1   1.2   Alkaline Phos 38 - 126 U/L 61   74   AST 15 - 41 U/L 24   32   ALT 0 - 44 U/L 30   32      Scheduled Meds:  aspirin  81 mg Oral Daily   citalopram  20 mg Oral Daily   cyanocobalamin  1,000 mcg Oral QHS   diltiazem  120 mg Oral Daily   insulin aspart  0-6 Units Subcutaneous TID WC   lisinopril  20 mg Oral Daily   niMODipine  0-60 mg Oral 60 min  Pre-Op   pantoprazole  40 mg Oral Daily   simvastatin  40 mg Oral q1800   ticagrelor  90 mg Oral BID   Continuous Infusions:  sodium chloride 75 mL/hr at 04/09/23 0430    ceFAZolin (ANCEF) IV     heparin 1,600 Units/hr (04/09/23 0430)    Time  ***  Rhetta Mura, MD  [facility]

## 2023-04-09 NOTE — Progress Notes (Signed)
Pt discharged to home with wife, instruction give by day RN, pt acknowledged understanding of instructions, stressed to pt to have arterial line bandage in place until tomorrow and also femoral site bandage, pt agreed. No bleeding noted at side after ambulation some soreness while walking otherwise no acute distress,SRP, RN

## 2023-04-09 NOTE — TOC Transition Note (Signed)
Transition of Care [facility]) - CM/SW Discharge Note   Patient Details  Name: Charles Phelps MRN: [id number] Date of Birth: 05/10/1966  Transition of Care [facility]) CM/SW Contact:  Kermit Balo, RN Phone Number: 04/09/2023, 3:40 PM   Clinical Narrative:    Pt discharging home with outpatient therapy at [facility]. Referral sent. Pt will call to schedule the first appointment and information is on the AVS.  30 day and co pay cards provided to patients spouse for Brillinta and Eliquis.  Wife will transport home when discharged.    Final next level of care: OP Rehab Barriers to Discharge: No Barriers Identified   Patient Goals and CMS Choice   Choice offered to / list presented to : Patient  Discharge Placement                         Discharge Plan and Services Additional resources added to the After Visit Summary for     Discharge Planning Services: CM Consult                                 Social Determinants of Health (SDOH) Interventions SDOH Screenings   Food Insecurity: No Food Insecurity (04/06/2023)  Housing: Low Risk  (04/06/2023)  Transportation Needs: No Transportation Needs (04/06/2023)  Utilities: Not At Risk (04/07/2023)  Depression (PHQ2-9): Medium Risk (12/19/2022)  Tobacco Use: Low Risk  (04/09/2023)     Readmission Risk Interventions     No data to display

## 2023-04-09 NOTE — Sedation Documentation (Signed)
Perclose closure device deployed by MD

## 2023-04-09 NOTE — Anesthesia Postprocedure Evaluation (Signed)
Anesthesia Post Note  Patient: Charles Phelps  Procedure(s) Performed: IR WITH ANESTHESIA     Patient location during evaluation: PACU Anesthesia Type: General Level of consciousness: awake and alert, oriented and patient cooperative Pain management: pain level controlled Vital Signs Assessment: post-procedure vital signs reviewed and stable Respiratory status: spontaneous breathing, nonlabored ventilation and respiratory function stable Cardiovascular status: blood pressure returned to baseline and stable Postop Assessment: no apparent nausea or vomiting Anesthetic complications: no   No notable events documented.  Last Vitals:  Vitals:   04/09/23 1415 04/09/23 1430  BP: (!) 117/56 (!) 125/58  Pulse: (!) 50 (!) 55  Resp: 11 13  Temp:    SpO2: 95% 96%    Last Pain:  Vitals:   04/09/23 1134  TempSrc:   PainSc: 0-No pain                 Audryanna Zurita,E. Abdou Stocks

## 2023-04-09 NOTE — Anesthesia Procedure Notes (Signed)
Procedure Name: Intubation Date/Time: 04/09/2023 12:01 PM  Performed by: Sandie Ano, CRNAPre-anesthesia Checklist: Patient identified, Emergency Drugs available, Suction available and Patient being monitored Patient Re-evaluated:Patient Re-evaluated prior to induction Oxygen Delivery Method: Circle System Utilized Preoxygenation: Pre-oxygenation with 100% oxygen Induction Type: IV induction Ventilation: Mask ventilation without difficulty Laryngoscope Size: Mac and 3 Grade View: Grade II Tube type: Oral Number of attempts: 1 Airway Equipment and Method: Stylet and Oral airway Placement Confirmation: ETT inserted through vocal cords under direct vision, positive ETCO2 and breath sounds checked- equal and bilateral Secured at: 23 cm Tube secured with: Tape Dental Injury: Teeth and Oropharynx as per pre-operative assessment

## 2023-04-09 NOTE — Sedation Documentation (Signed)
Pt arrived to procedure room. Alert & orientated x4. Pt moved self to procedure table, secured. Under the care of anesthesia, please see charting and vitals per CRNA

## 2023-04-09 NOTE — Procedures (Signed)
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  Access closure: Perclose Prostyle.  Anesthesia: IR sedation: General endotracheal anesthesia.  Medication used: 10 mg Verapamil IA.  Complications: None.  Estimated blood loss: None.  Specimen: None.  Findings: Mild stenosis of the proximal basilar artery, significantly improved from prior CT angiogram. Therefore, no angioplasty or stenting was performed.  The patient tolerated the procedure well and was transferred to PACU in stable condition.   PLAN: - Bed chart x 6 hours - SBP 100 - 160 mmHg - May be discharged today pending neurology evaluation - Continue DAPT

## 2023-04-10 ENCOUNTER — Telehealth: Payer: Self-pay

## 2023-04-10 ENCOUNTER — Encounter (HOSPITAL_COMMUNITY): Payer: Self-pay | Admitting: Radiology

## 2023-04-10 ENCOUNTER — Encounter: Payer: Self-pay | Admitting: Internal Medicine

## 2023-04-10 DIAGNOSIS — I679 Cerebrovascular disease, unspecified: Secondary | ICD-10-CM

## 2023-04-10 NOTE — Transitions of Care (Post Inpatient/ED Visit) (Signed)
   04/10/2023  Name: NIKUNJ LIPSETT MRN: [id number] DOB: 07/24/1966  Today's TOC FU Call Status: Today's TOC FU Call Status:: Unsuccessful Call (1st Attempt) Unsuccessful Call (1st Attempt) Date: 04/10/23  Attempted to reach the patient regarding the most recent Inpatient/ED visit.  Follow Up Plan: Additional outreach attempts will be made to reach the patient to complete the Transitions of Care (Post Inpatient/ED visit) call.     Antionette Fairy, RN,BSN,CCM [facility] Care Management Care Management Community Coordinator Direct Phone: [phone] Toll Free: [phone] Fax: [phone]

## 2023-04-11 ENCOUNTER — Telehealth: Payer: Self-pay

## 2023-04-11 NOTE — Transitions of Care (Post Inpatient/ED Visit) (Signed)
04/11/2023  Name: Charles Phelps MRN: [id number] DOB: 1966-01-07  Today's TOC FU Call Status: Today's TOC FU Call Status:: Successful TOC FU Call Completed TOC FU Call Complete Date: 04/11/23 Patient's Name and Date of Birth confirmed.  Transition Care Management Follow-up Telephone Call Date of Discharge: 04/09/23 Discharge Facility: [facility]) Type of Discharge: Inpatient Admission Primary Inpatient Discharge Diagnosis:: "acute CVA" How have you been since you were released from the hospital?: Better (Spoke w/ wif states pt "doing okay-just a litle tired."-still has some "soreness to groin"-not having to take anything-area is healing and looks fine.He is up walking & moving around. Appetite is good. No issues with elmination.) Any questions or concerns?: No  Items Reviewed: Did you receive and understand the discharge instructions provided?: Yes Medications obtained,verified, and reconciled?: Yes (Medications Reviewed) Any new allergies since your discharge?: No Dietary orders reviewed?: Yes Type of Diet Ordered:: low salt/heart healthy Do you have support at home?: Yes People in Home: spouse Name of Support/Comfort Primary Source: Wilkie Aye  Medications Reviewed Today: Medications Reviewed Today     Reviewed by Charlyn Minerva, RN (Registered Nurse) on 04/11/23 at 1057  Med List Status: <None>   Medication Order Taking? Sig Documenting Provider Last Dose Status Informant  apixaban (ELIQUIS) 5 MG TABS tablet [id number] Yes Take 1 tablet (5 mg total) by mouth 2 (two) times daily. Rhetta Mura, MD Taking Active   atorvastatin (LIPITOR) 40 MG tablet [id number] Yes Take 1 tablet (40 mg total) by mouth daily. Rhetta Mura, MD Taking Active   cetirizine (ZYRTEC) 10 MG tablet [id number] Yes Take 10 mg by mouth daily. [provider] Taking Active Self  citalopram (CELEXA) 20 MG tablet [id number] Yes TAKE 1 TABLET BY MOUTH DAILY [name],  MD Taking Active Self  colchicine 0.6 MG tablet [id number] Yes TAKE 1 TABLET BY MOUTH  TWICE DAILY AS NEEDED [name], MD Taking Active Self  cyanocobalamin 1000 MCG tablet [id number] Yes Take 1,000 mcg by mouth daily. [provider] Taking Active Self  diltiazem (CARDIZEM CD) 120 MG 24 hr capsule [id number] Yes TAKE 1 CAPSULE BY MOUTH DAILY [name], MD Taking Active Self  FARXIGA 10 MG TABS tablet [id number] Yes TAKE 1 TABLET BY MOUTH DAILY  BEFORE BREAKFAST [name], MD Taking Active   glucose blood (ONETOUCH VERIO) test strip [id number] Yes Use to check blood sugar 1-2 times daily [name], MD Taking Active Self  hyoscyamine (LEVSIN) 0.125 MG tablet [id number] Yes Take 1 tablet (0.125 mg total) by mouth 3 (three) times daily as needed.  Patient taking differently: Take 0.125 mg by mouth daily.   [name], MD Taking Active Self  lisinopril (ZESTRIL) 20 MG tablet [id number] Yes TAKE 1 TABLET BY MOUTH DAILY [name], MD Taking Active Self  metFORMIN (GLUCOPHAGE-XR) 500 MG 24 hr tablet [id number] Yes TAKE 2 TABLETS BY MOUTH DAILY  WITH BREAKFAST [name], MD Taking Active Self  Methylcellulose, Laxative, (CITRUCEL) 500 MG TABS [id number] Yes Take 1 tablet by mouth at bedtime. [provider] Taking Active Self  Multiple Vitamin (MULTIVITAMIN WITH MINERALS) TABS tablet [id number] Yes Take 1 tablet by mouth daily. [provider] Taking Active Self  omeprazole (PRILOSEC) 20 MG capsule [id number] Yes TAKE 1 CAPSULE BY MOUTH DAILY [name], MD Taking Active Self  OneTouch Delica Lancets 33G MISC [id number] Yes Use to obtain blood sugar sample 1-2 times daily. [name], MD Taking Active Self  valACYclovir (VALTREX) 1000 MG tablet [id number] Yes TAKE 2 TABLETS BY MOUTH TWICE  DAILY FOR 1 DAY FOR COLD SORE [name], MD Taking Active Self            Home Care and Equipment/Supplies: Were Home  Health Services Ordered?: NA (Pt has been set up with outpt neuro rehab-first appt is 04/14/23) Any new equipment or medical supplies ordered?: NA  Functional Questionnaire: Do you need assistance with bathing/showering or dressing?: No Do you need assistance with meal preparation?: No Do you need assistance with eating?: No Do you have difficulty maintaining continence: No Do you need assistance with getting out of bed/getting out of a chair/moving?: No Do you have difficulty managing or taking your medications?: No  Follow up appointments reviewed: PCP Follow-up appointment confirmed?: No (Wife voices they they have been in contact w/ PCP via mychart-she will call and make appt later) MD Provider Line Number:[phone] Given: No Specialist Hospital Follow-up appointment confirmed?: No Reason Specialist Follow-Up Not Confirmed: Patient has Specialist Provider Number and will Call for Appointment (wife aware to call and make neuro appt and voices she will do so later today) Do you need transportation to your follow-up appointment?: No Do you understand care options if your condition(s) worsen?: Yes-patient verbalized understanding  SDOH Interventions Today    Flowsheet Row Most Recent Value  SDOH Interventions   Food Insecurity Interventions Intervention Not Indicated  Transportation Interventions Intervention Not Indicated       TOC Interventions Today    Flowsheet Row Most Recent Value  TOC Interventions   TOC Interventions Discussed/Reviewed TOC Interventions Discussed, S/S of infection, Post discharge activity limitations per provider, Post op wound/incision care      Interventions Today    Flowsheet Row Most Recent Value  Chronic Disease   Chronic disease during today's visit Other  [post stroke mgmt]  General Interventions   General Interventions Discussed/Reviewed General Interventions Discussed, Doctor Visits  Doctor Visits Discussed/Reviewed Doctor Visits  Discussed, PCP, Specialist  PCP/Specialist Visits Compliance with follow-up visit  Education Interventions   Education Provided Provided Education  Provided Verbal Education On Nutrition, When to see the doctor  Nutrition Interventions   Nutrition Discussed/Reviewed Nutrition Discussed  Pharmacy Interventions   Pharmacy Dicussed/Reviewed Pharmacy Topics Discussed, Medications and their functions  Safety Interventions   Safety Discussed/Reviewed Safety Discussed        Alessandra Grout [facility] Care Management Care Management Community Coordinator Direct Phone: [phone] Toll Free: [phone] Fax: [phone]

## 2023-04-14 ENCOUNTER — Ambulatory Visit: Payer: BC Managed Care – PPO | Attending: Family Medicine

## 2023-04-14 VITALS — BP 157/89

## 2023-04-14 DIAGNOSIS — M6281 Muscle weakness (generalized): Secondary | ICD-10-CM | POA: Insufficient documentation

## 2023-04-14 DIAGNOSIS — R2681 Unsteadiness on feet: Secondary | ICD-10-CM | POA: Insufficient documentation

## 2023-04-14 DIAGNOSIS — R278 Other lack of coordination: Secondary | ICD-10-CM | POA: Insufficient documentation

## 2023-04-14 DIAGNOSIS — I639 Cerebral infarction, unspecified: Secondary | ICD-10-CM | POA: Diagnosis not present

## 2023-04-14 NOTE — Therapy (Signed)
OUTPATIENT PHYSICAL THERAPY NEURO EVALUATION   Patient Name: Charles Phelps MRN: [id number] DOB:03/26/1966, 57 y.o., male Today's Date: 04/14/2023   PCP: Tillman Abide, MD REFERRING PROVIDER: Rhetta Mura, MD  END OF SESSION:  PT End of Session - 04/14/23 1003     Visit Number 1    Number of Visits 1    Authorization Type BCBS    PT Start Time 1012    PT Stop Time 1049    PT Time Calculation (min) 37 min    Equipment Utilized During Treatment Gait belt    Activity Tolerance Patient tolerated treatment well    Behavior During Therapy WFL for tasks assessed/performed             Past Medical History:  Diagnosis Date   Allergic rhinitis    Anxiety    Arthritis    Atrial fibrillation (HCC)    Diabetes (HCC)    Elevated LFTs 2000   fatty liver   GERD (gastroesophageal reflux disease)    Glucose intolerance (impaired glucose tolerance)    Gout    Hyperlipidemia    Hypertension    IBS (irritable bowel syndrome)    Obstructive sleep apnea    Osteoarthritis    Past Surgical History:  Procedure Laterality Date   IR ANGIO INTRA EXTRACRAN SEL INTERNAL CAROTID BILAT MOD SED  04/09/2023   IR ANGIO VERTEBRAL SEL VERTEBRAL BILAT MOD SED  04/09/2023   IR ENDOVASC INTRACRANIAL INF OTHER THAN THROMBO ART INC DIAG ANGIO EA ADD  04/09/2023   IR US GUIDE VASC ACCESS RIGHT  04/09/2023   NO PAST SURGERIES     RADIOLOGY WITH ANESTHESIA N/A 04/09/2023   Procedure: IR WITH ANESTHESIA;  Surgeon: Radiologist, Medication, MD;  Location: [facility] OR;  Service: Radiology;  Laterality: N/A;   Patient Active Problem List   Diagnosis Date Noted   Acute CVA (cerebrovascular accident) (HCC) 04/05/2023   Unilateral primary osteoarthritis, left hip 07/04/2022   Statin intolerance 12/12/2021   Vitamin B12 deficiency 12/12/2021   History of colonic polyps 12/06/2021   Chronic diarrhea 05/30/2020   Chronic anticoagulation 05/30/2020   Tendonitis, Achilles, left 03/23/2019   Gout     Moderate obesity 10/01/2017   Paroxysmal atrial fibrillation (HCC) 01/30/2017   OSA (obstructive sleep apnea) 08/29/2014   Type 2 diabetes mellitus with other circulatory complications (HCC) 02/07/2011   Preventative health care 02/01/2011   IBS 02/27/2009   Essential hypertension, benign 01/27/2009   OSTEOARTHRITIS 02/21/2007   Hyperlipemia 02/17/2007   Mood disorder (HCC) 02/17/2007   Allergic rhinitis 02/17/2007   GERD 02/17/2007   ARTHRITIS, LEFT HIP 02/17/2007    ONSET DATE: 04/09/2023 referral  REFERRING DIAG: I63.9 (ICD-10-CM) - Acute CVA (cerebrovascular accident) (HCC)  THERAPY DIAG:  Muscle weakness (generalized) - Plan: PT plan of care cert/re-cert  Unsteadiness on feet - Plan: PT plan of care cert/re-cert  Other lack of coordination - Plan: PT plan of care cert/re-cert  Rationale for Evaluation and Treatment: Rehabilitation  SUBJECTIVE:  SUBJECTIVE STATEMENT: Patient arrives to clinic with wife. States he had a stroke and has felt off balance. Biggest compliant with balanc ein the shower. Wife reports some slurred speech, but not new since hospital. Does have small incision in R hip/groin from IR. Has not tried golfing since.  Pt accompanied by: significant other  PERTINENT HISTORY: obstructive sleep apnea on CPAP, hypertension, hyperlipidemia, and atrial fibrillation on Xarelto.  PAIN:  Are you having pain? Yes: NPRS scale: 1/10 Pain location: R hip  PRECAUTIONS: Fall   WEIGHT BEARING RESTRICTIONS: No  FALLS: Has patient fallen in last 6 months? No  LIVING ENVIRONMENT: Lives with: lives with their spouse Lives in: House/apartment Stairs: Yes: External: 1 steps; none Has following equipment at home: Single point cane  PLOF: Independent  PATIENT GOALS: "get back to  golfing"   OBJECTIVE:   DIAGNOSTIC FINDINGS: 04/05/23 brain MRI: IMPRESSION: 1. Acute/subacute nonhemorrhagic right pontine infarct. 2. Remote left paramedian pontine infarct. 3. Remote nonhemorrhagic infarct of the right lentiform nucleus. 4. Remote lacunar infarct of the lateral left cerebellum.  COGNITION: Overall cognitive status: Within functional limits for tasks assessed   SENSATION: WFL  COORDINATION: Slightly dysmetric L LE with figure 8    POSTURE: No Significant postural limitations   LOWER EXTREMITY MMT:   Grossly 4+/5  BED MOBILITY:  Reports independent  STAIRS: Level of Assistance: Modified independence Stair Negotiation Technique: Alternating Pattern  with Single Rail on Right Number of Stairs: 4  Height of Stairs: 6    GAIT: Gait pattern: WFL Distance walked: clinic Assistive device utilized: None Level of assistance: Complete Independence   FUNCTIONAL TESTS:   [facility] PT Assessment - 04/14/23 0001       Standardized Balance Assessment   Standardized Balance Assessment Timed Up and Go Test    Five times sit to stand comments  11.3    10 Meter Walk 1.28m/s      Timed Up and Go Test   Normal TUG (seconds) 8.23      Functional Gait  Assessment   Gait assessed  Yes    Gait Level Surface Walks 20 ft in less than 5.5 sec, no assistive devices, good speed, no evidence for imbalance, normal gait pattern, deviates no more than 6 in outside of the 12 in walkway width.    Change in Gait Speed Able to smoothly change walking speed without loss of balance or gait deviation. Deviate no more than 6 in outside of the 12 in walkway width.    Gait with Horizontal Head Turns Performs head turns smoothly with no change in gait. Deviates no more than 6 in outside 12 in walkway width    Gait with Vertical Head Turns Performs head turns with no change in gait. Deviates no more than 6 in outside 12 in walkway width.    Gait and Pivot Turn Pivot turns safely within 3  sec and stops quickly with no loss of balance.    Step Over Obstacle Is able to step over 2 stacked shoe boxes taped together (9 in total height) without changing gait speed. No evidence of imbalance.    Gait with Narrow Base of Support Ambulates 7-9 steps.    Gait with Eyes Closed Walks 20 ft, uses assistive device, slower speed, mild gait deviations, deviates 6-10 in outside 12 in walkway width. Ambulates 20 ft in less than 9 sec but greater than 7 sec.    Ambulating Backwards Walks 20 ft, uses assistive device, slower speed, mild gait deviations,  deviates 6-10 in outside 12 in walkway width.    Steps Alternating feet, must use rail.    Total Score 26            Golf swing: fully independent, mainly limited by pain in R hip   PATIENT SURVEYS:  FOTO 74, expected to be at 69  TODAY'S TREATMENT:                                                                                                                              Prescribed HEP   PATIENT EDUCATION: Education details: PT POC, HEP, exam findings, only driving with MD clearance Person educated: Patient and Spouse Education method: Explanation, Demonstration, and Handouts Education comprehension: verbalized understanding  HOME EXERCISE PROGRAM: Balance: Fix / Hold Follow-Through    Hold full follow-through for at least 5 full seconds. Do not count until balanced. Without ball, repeat ___3_ times per set. With ball, repeat ___3_ times per set. Do __3__ sets of each. Do __7__ sessions per week. Balance: Eyes Open - Unilateral (Varied Surfaces)    Stand on left foot, eyes open. Maintain balance ___30_ seconds. Repeat _3___ times per set. Do ___3_ sets per session. Do __3__ sessions per week. Repeat on compliant surface: foam. Then do with eyes closed.  Balance: Eyes Open - Bilateral (Varied Surfaces)    Stand, feet shoulder width, eyes open. Maintain balance __30__ seconds. Repeat __3__ times per set. Do __3__ sets per  session. Do ___7_ sessions per week. Repeat on compliant surface: foam. Then do with eyes closed.     GOALS: Not indicated as patient does not require skilled PT at this time.   ASSESSMENT:  CLINICAL IMPRESSION: Patient is a 57 y.o. male who was seen today for physical therapy evaluation and treatment for imbalance s/p CVA. Patient scored a 26/30 on  Functional Gait Assessment.   <22/30 = predictive of falls, <20/30 = fall in 6 months, <18/30 = predictive of falls in PD MCID: 5 points stroke population, 4 points geriatric population (ANPTA Core Set of Outcome Measures for Adults with Neurologic Conditions, 2018). 10 Meter Walk Test: Patient instructed to walk 10 meters (32.8 ft) as quickly and as safely as possible at their normal speed x2 and at a fast speed x2. Time measured from 2 meter mark to 8 meter mark to accommodate ramp-up and ramp-down.  Normal speed: 1.45m/s Cut off scores: <0.4 m/s = household Ambulator, 0.4-0.8 m/s = limited community Ambulator, >0.8 m/s = community Ambulator, >1.2 m/s = crossing a street, <1.0 = increased fall risk MCID 0.05 m/s (small), 0.13 m/s (moderate), 0.06 m/s (significant)  (ANPTA Core Set of Outcome Measures for Adults with Neurologic Conditions, 2018). Patient completed the Timed Up and Go test (TUG) in 8.23 seconds.  Geriatrics: need for further assessment of fall risk: >/= 12 sec; Recurrent falls: > 15 sec; Vestibular Disorders fall risk: > 15 sec; Parkinson's Disease fall risk: > 16 sec ([email or web address], 2023).  Five times Sit to Stand Test (FTSS) Method: Use a straight back chair with a solid seat that is 17-18" high. Ask participant to sit on the chair with arms folded across their chest.   Instructions: "Stand up and sit down as quickly as possible 5 times, keeping your arms folded across your chest."   Measurement: Stop timing when the participant touches the chair in sitting the 5th time.  TIME: 11.3 sec  Cut off scores indicative of increased  fall risk: >12 sec CVA, >16 sec PD, >13 sec vestibular (ANPTA Core Set of Outcome Measures for Adults with Neurologic Conditions, 2018). Patient does not require skilled PT services at this time.   CLINICAL DECISION MAKING: Stable/uncomplicated  EVALUATION COMPLEXITY: Low  PLAN:  PT FREQUENCY: one time visit  PT DURATION: other: 1x visit   Westley Foots, PT Westley Foots, PT, DPT, CBIS  04/14/2023, 10:54 AM

## 2023-04-16 ENCOUNTER — Ambulatory Visit: Payer: BC Managed Care – PPO | Admitting: Internal Medicine

## 2023-04-16 ENCOUNTER — Encounter: Payer: Self-pay | Admitting: Internal Medicine

## 2023-04-16 VITALS — BP 114/84 | HR 66 | Temp 97.6°F | Ht 75.0 in | Wt 251.0 lb

## 2023-04-16 DIAGNOSIS — Z7984 Long term (current) use of oral hypoglycemic drugs: Secondary | ICD-10-CM | POA: Diagnosis not present

## 2023-04-16 DIAGNOSIS — I480 Paroxysmal atrial fibrillation: Secondary | ICD-10-CM | POA: Diagnosis not present

## 2023-04-16 DIAGNOSIS — Z8673 Personal history of transient ischemic attack (TIA), and cerebral infarction without residual deficits: Secondary | ICD-10-CM | POA: Insufficient documentation

## 2023-04-16 DIAGNOSIS — E1159 Type 2 diabetes mellitus with other circulatory complications: Secondary | ICD-10-CM | POA: Diagnosis not present

## 2023-04-16 MED ORDER — ROSUVASTATIN CALCIUM 20 MG PO TABS: 20.0000 mg | ORAL_TABLET | Freq: Every day | ORAL | 0 refills | Status: DC

## 2023-04-16 MED ORDER — ROSUVASTATIN CALCIUM 20 MG PO TABS: 20.0000 mg | ORAL_TABLET | Freq: Every day | ORAL | 3 refills | Status: DC

## 2023-04-16 NOTE — Progress Notes (Signed)
Subjective:    Patient ID: Charles Phelps, male    DOB: 1966/05/31, 57 y.o.   MRN: [id number]  HPI Here with wife for hospital follow up With wife  Hospitalized 9/7 with acute neurologic symptoms Diagnosed with pontine stroke Felt to have vertebral stenosis---IR went in but looked better, and decided it might have been spasm (and heparin cleared a clot) Concern about xarelto failure---changed to eliquis Echo was benign  Had been reluctant to take statin in the past Now on atorvastatin --- no apparent problems (no myalgias and no apparent memory issues as yet)  Symptoms have improved Went to PT evaluation--can't walk heel to toe Speech slightly off--slurring that has improved  Is retired Not back to golfing due to groin access Is walking without problems  Is eating healthier now  Current Outpatient Medications on File Prior to Visit  Medication Sig Dispense Refill   apixaban (ELIQUIS) 5 MG TABS tablet Take 1 tablet (5 mg total) by mouth 2 (two) times daily. 60 tablet 11   atorvastatin (LIPITOR) 40 MG tablet Take 1 tablet (40 mg total) by mouth daily. 30 tablet 11   cetirizine (ZYRTEC) 10 MG tablet Take 10 mg by mouth daily.     citalopram (CELEXA) 20 MG tablet TAKE 1 TABLET BY MOUTH DAILY 90 tablet 3   colchicine 0.6 MG tablet TAKE 1 TABLET BY MOUTH  TWICE DAILY AS NEEDED 180 tablet 1   cyanocobalamin 1000 MCG tablet Take 1,000 mcg by mouth daily.     diltiazem (CARDIZEM CD) 120 MG 24 hr capsule TAKE 1 CAPSULE BY MOUTH DAILY 90 capsule 3   FARXIGA 10 MG TABS tablet TAKE 1 TABLET BY MOUTH DAILY  BEFORE BREAKFAST 90 tablet 3   glucose blood (ONETOUCH VERIO) test strip Use to check blood sugar 1-2 times daily 200 each 11   hyoscyamine (LEVSIN) 0.125 MG tablet Take 1 tablet (0.125 mg total) by mouth 3 (three) times daily as needed. (Patient taking differently: Take 0.125 mg by mouth daily.) 60 tablet 1   lisinopril (ZESTRIL) 20 MG tablet TAKE 1 TABLET BY MOUTH DAILY 90 tablet  3   metFORMIN (GLUCOPHAGE-XR) 500 MG 24 hr tablet TAKE 2 TABLETS BY MOUTH DAILY  WITH BREAKFAST 180 tablet 3   Methylcellulose, Laxative, (CITRUCEL) 500 MG TABS Take 1 tablet by mouth at bedtime.     Multiple Vitamin (MULTIVITAMIN WITH MINERALS) TABS tablet Take 1 tablet by mouth daily.     omeprazole (PRILOSEC) 20 MG capsule TAKE 1 CAPSULE BY MOUTH DAILY 90 capsule 3   OneTouch Delica Lancets 33G MISC Use to obtain blood sugar sample 1-2 times daily. 200 each 11   valACYclovir (VALTREX) 1000 MG tablet TAKE 2 TABLETS BY MOUTH TWICE  DAILY FOR 1 DAY FOR COLD SORE 20 tablet 3   No current facility-administered medications on file prior to visit.    Allergies  Allergen Reactions   Atorvastatin Other (See Comments)    Memory problems    Past Medical History:  Diagnosis Date   Allergic rhinitis    Anxiety    Arthritis    Atrial fibrillation (HCC)    Diabetes (HCC)    Elevated LFTs 2000   fatty liver   GERD (gastroesophageal reflux disease)    Glucose intolerance (impaired glucose tolerance)    Gout    Hyperlipidemia    Hypertension    IBS (irritable bowel syndrome)    Obstructive sleep apnea    Osteoarthritis     Past Surgical  History:  Procedure Laterality Date   IR ANGIO INTRA EXTRACRAN SEL INTERNAL CAROTID BILAT MOD SED  04/09/2023   IR ANGIO VERTEBRAL SEL VERTEBRAL BILAT MOD SED  04/09/2023   IR ENDOVASC INTRACRANIAL INF OTHER THAN THROMBO ART INC DIAG ANGIO EA ADD  04/09/2023   IR US GUIDE VASC ACCESS RIGHT  04/09/2023   NO PAST SURGERIES     RADIOLOGY WITH ANESTHESIA N/A 04/09/2023   Procedure: IR WITH ANESTHESIA;  Surgeon: Radiologist, Medication, MD;  Location: [facility] OR;  Service: Radiology;  Laterality: N/A;    Family History  Problem Relation Age of Onset   Hypertension Mother    Diabetes Mother    Heart disease Father        CABG at 106   Cancer Father        lung   Colon polyps Father    Diabetes Sister    Diabetes Brother    Heart failure Brother    Colon  cancer Neg Hx    Stomach cancer Neg Hx    Esophageal cancer Neg Hx     Social History   Socioeconomic History   Marital status: Married    Spouse name: Not on file   Number of children: 1   Years of education: Not on file   Highest education level: Not on file  Occupational History   Occupation: Museum/gallery conservator / Proofreader     Comment: Retired  Tobacco Use   Smoking status: Never   Smokeless tobacco: Never  Vaping Use   Vaping status: Never Used  Substance and Sexual Activity   Alcohol use: Yes    Comment: Occasionally   Drug use: No   Sexual activity: Not on file  Other Topics Concern   Not on file  Social History Narrative   Not on file   Social Determinants of Health   Financial Resource Strain: Not on file  Food Insecurity: No Food Insecurity (04/11/2023)   Hunger Vital Sign    Worried About Running Out of Food in the Last Year: Never true    Ran Out of Food in the Last Year: Never true  Transportation Needs: No Transportation Needs (04/11/2023)   PRAPARE - Administrator, Civil Service (Medical): No    Lack of Transportation (Non-Medical): No  Physical Activity: Not on file  Stress: Not on file  Social Connections: Not on file  Intimate Partner Violence: Not At Risk (04/07/2023)   Humiliation, Afraid, Rape, and Kick questionnaire    Fear of Current or Ex-Partner: No    Emotionally Abused: No    Physically Abused: No    Sexually Abused: No   Review of Systems Weight is down 10# since the spring    Objective:   Physical Exam Constitutional:      Appearance: Normal appearance.  Eyes:     Extraocular Movements: Extraocular movements intact.     Comments: No nystagmus  Cardiovascular:     Rate and Rhythm: Normal rate and regular rhythm.     Heart sounds: No murmur heard.    No gallop.  Pulmonary:     Effort: Pulmonary effort is normal.     Breath sounds: Normal breath sounds. No wheezing or rales.  Musculoskeletal:     Cervical back: Neck  supple.  Lymphadenopathy:     Cervical: No cervical adenopathy.  Neurological:     Mental Status: He is alert and oriented to person, place, and time.     Cranial Nerves: Cranial nerves  2-12 are intact.     Motor: No weakness, tremor or abnormal muscle tone.     Coordination: Coordination is intact. Romberg sign negative.     Gait: Gait normal.            Assessment & Plan:

## 2023-04-16 NOTE — Assessment & Plan Note (Signed)
Minimal residual Having PT--but seems to be okay to resume local driving Seeing Dr Pearlean Brownie about study involving anticoagulation Given atorvastatin--but had problems with that---will change to rosuvastatin

## 2023-04-16 NOTE — Assessment & Plan Note (Signed)
He has improved his lifestyle Goal A1c under 7% now No med changes for now--metformin 1000mg  AM and farxiga 10 daily

## 2023-04-16 NOTE — Assessment & Plan Note (Signed)
Unclear if this was embolic or thrombotic---but was changed to eliquis from xarelto and is going to Dr Pearlean Brownie

## 2023-04-17 ENCOUNTER — Other Ambulatory Visit (HOSPITAL_COMMUNITY): Payer: Self-pay

## 2023-04-25 IMAGING — DX DG KNEE COMPLETE 4+V*R*
4 series · 4 of 4 positions shown · non-contrast
Comparison: None.

CLINICAL DATA: Right knee pain

EXAM:
RIGHT KNEE - COMPLETE 4+ VIEW

[knee ap]
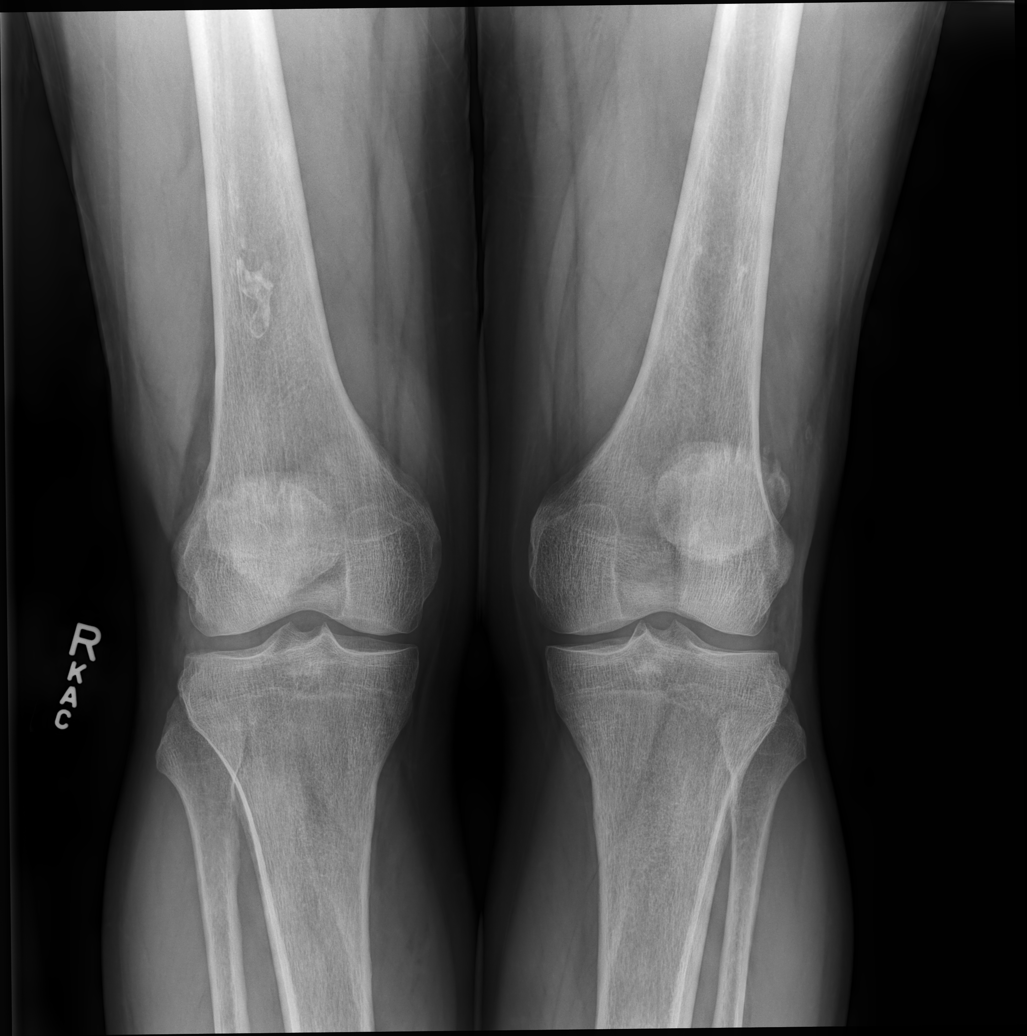

[knee lat]
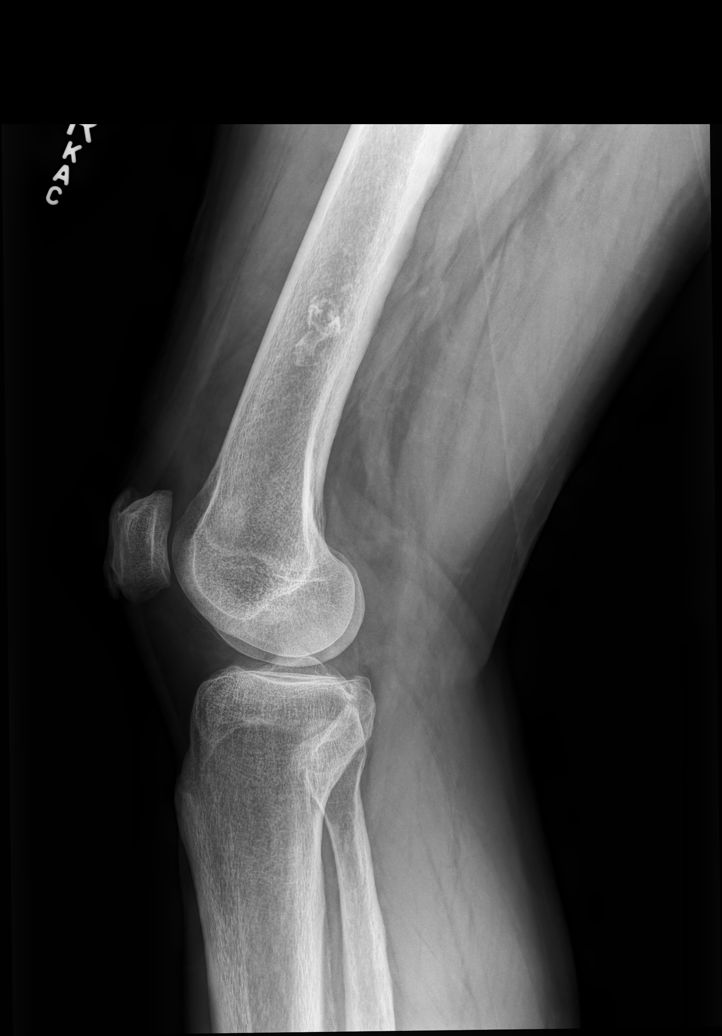

[knee [person_name] view pa]
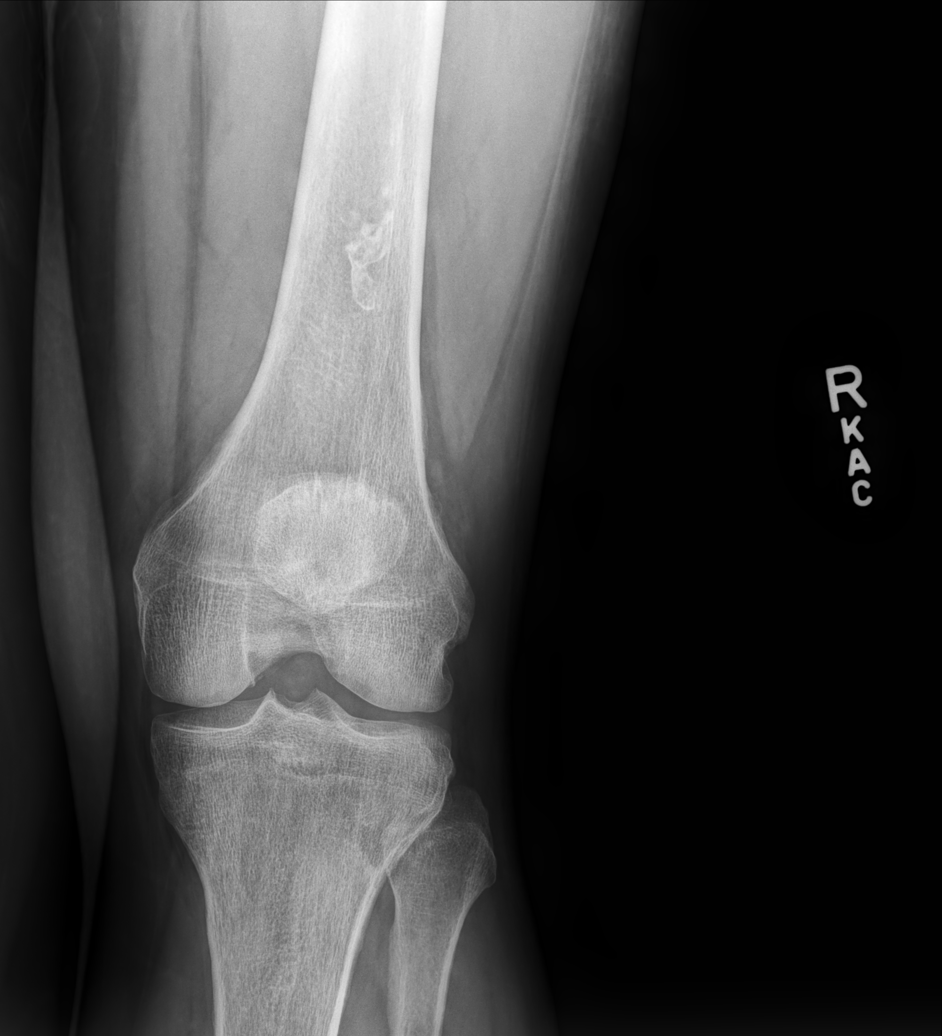

[patella (sunrise) tan]
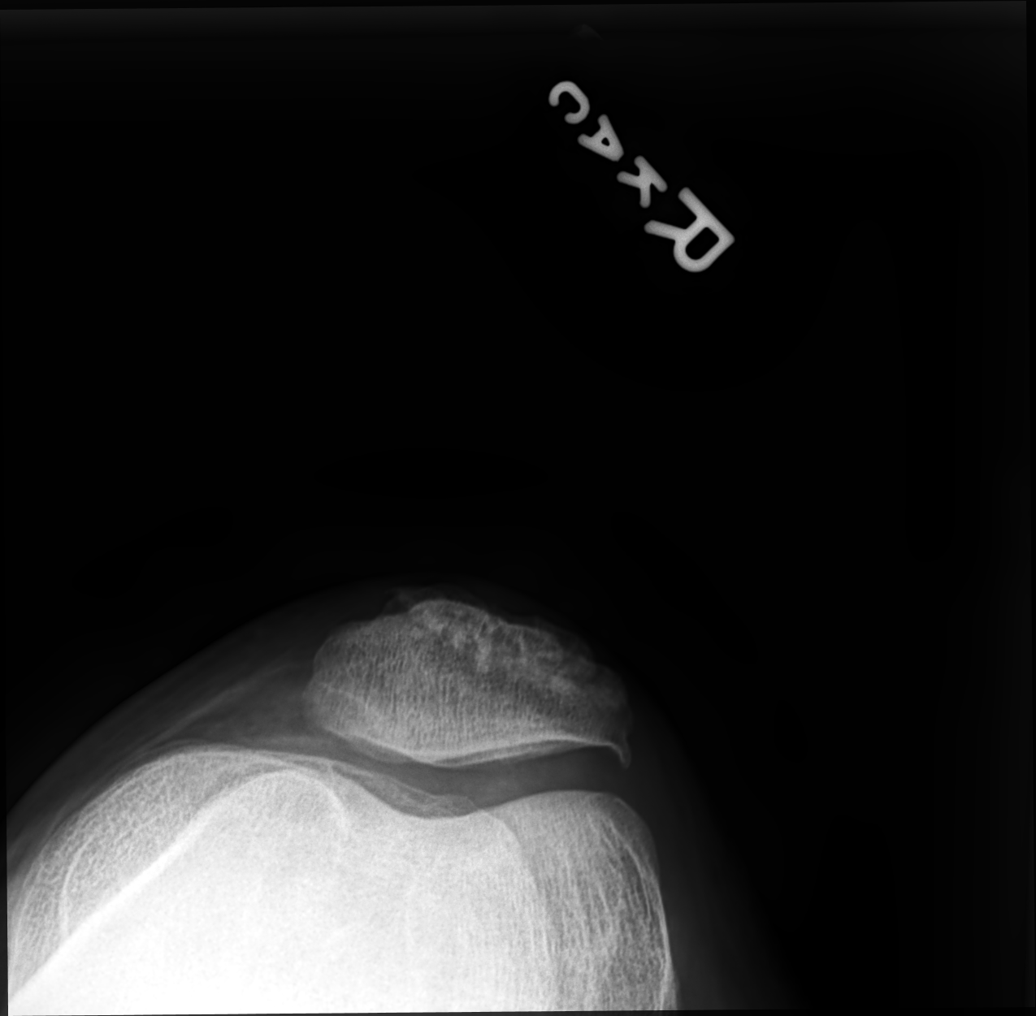

[4 of 4 positions shown; findings below may reference images not displayed]

FINDINGS: No acute fracture or dislocation is noted. Calcification is noted in
the medullary cavity of the distal right femur likely related to
prior bone infarct. No joint effusion is seen. Patellofemoral
degenerative changes are seen.
IMPRESSION: Chronic changes without acute abnormality.

## 2023-04-25 IMAGING — DX DG CLAVICLE*L*
2 series · 2 of 2 positions shown · non-contrast
Comparison: None.

CLINICAL DATA: Palpable abnormality along the clavicle, initial
encounter

EXAM:
LEFT CLAVICLE - 2+ VIEWS

[clavicle pa]
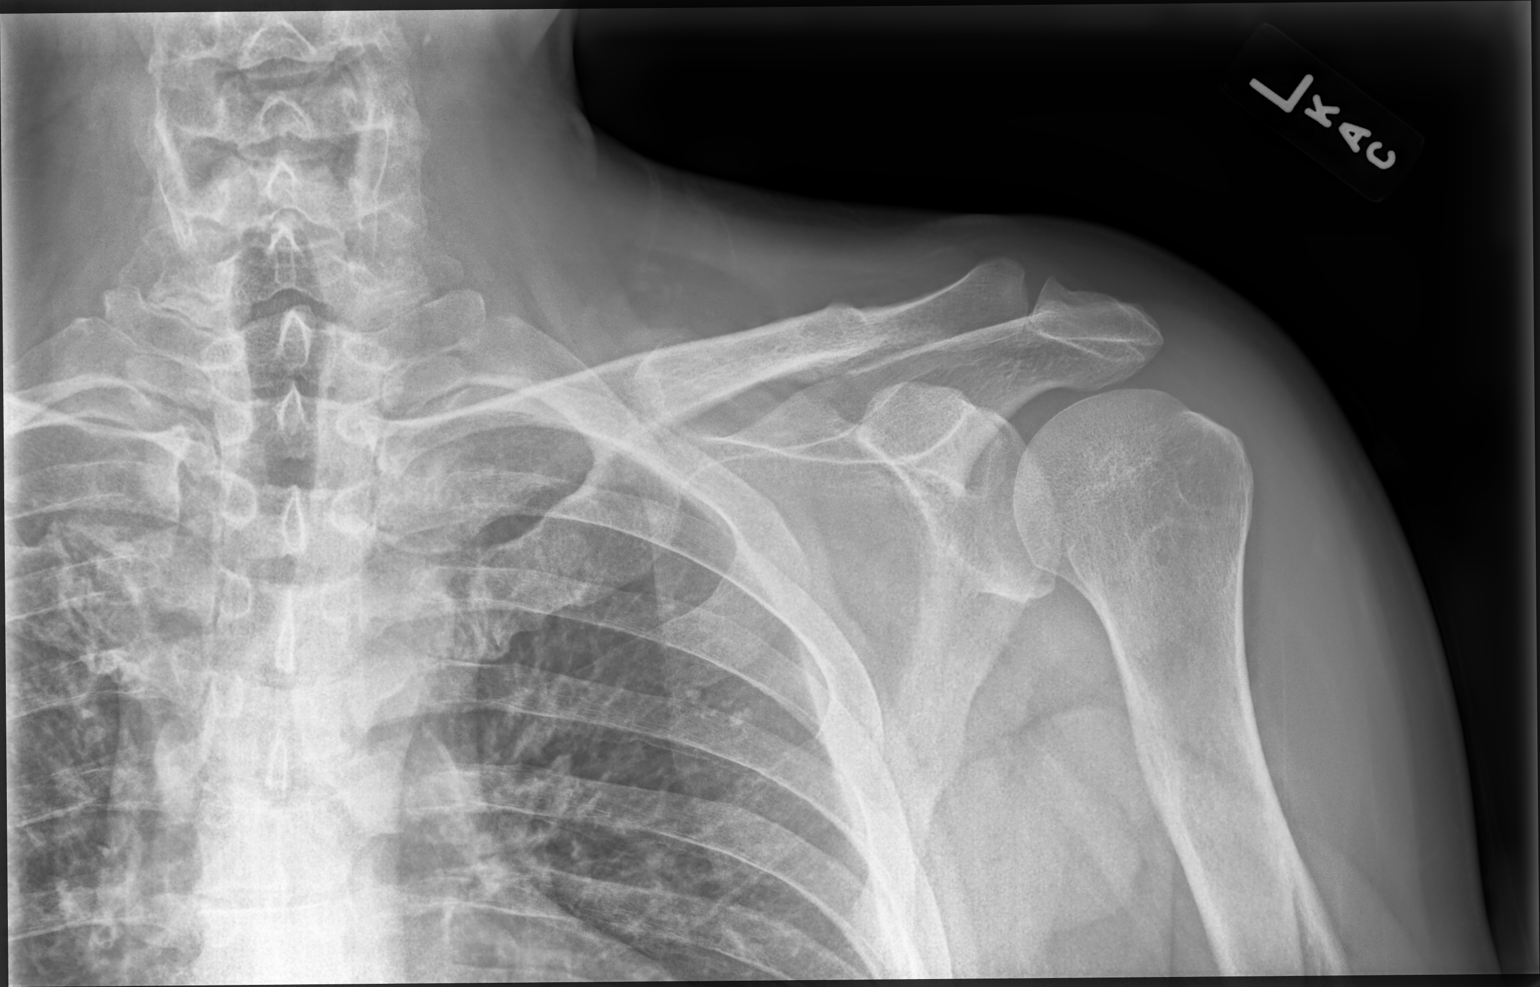

[clavicle tangential]
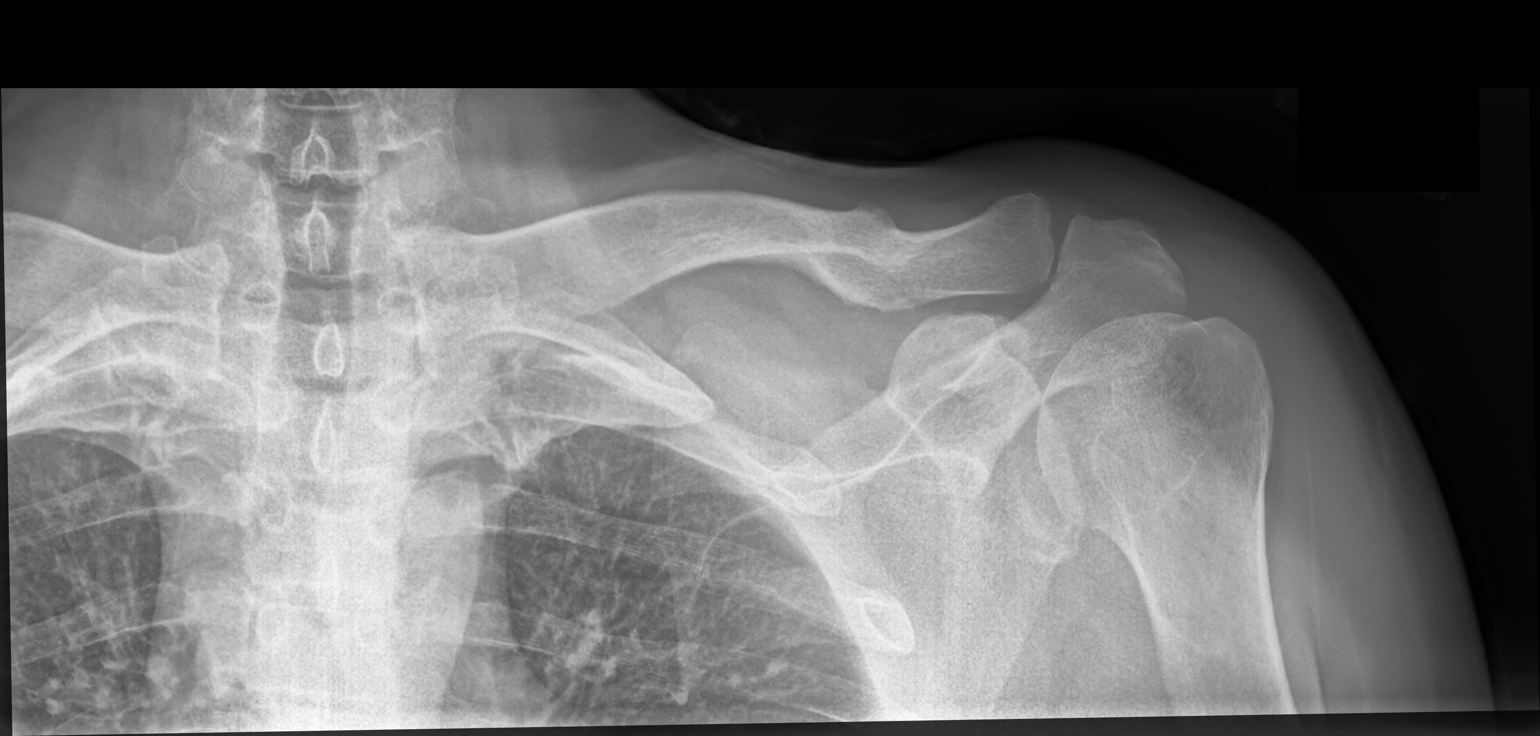

[2 of 2 positions shown; findings below may reference images not displayed]

FINDINGS: No acute fracture or dislocation is noted. No definitive bony
abnormality correspond with the clinical history is noted.
IMPRESSION: No acute abnormality noted.

## 2023-04-25 IMAGING — DX DG KNEE COMPLETE 4+V*L*
4 series · 4 of 4 positions shown · non-contrast
Comparison: None.

CLINICAL DATA: Left knee pain, no known injury, initial encounter

EXAM:
LEFT KNEE - COMPLETE 4+ VIEW

[knee ap]
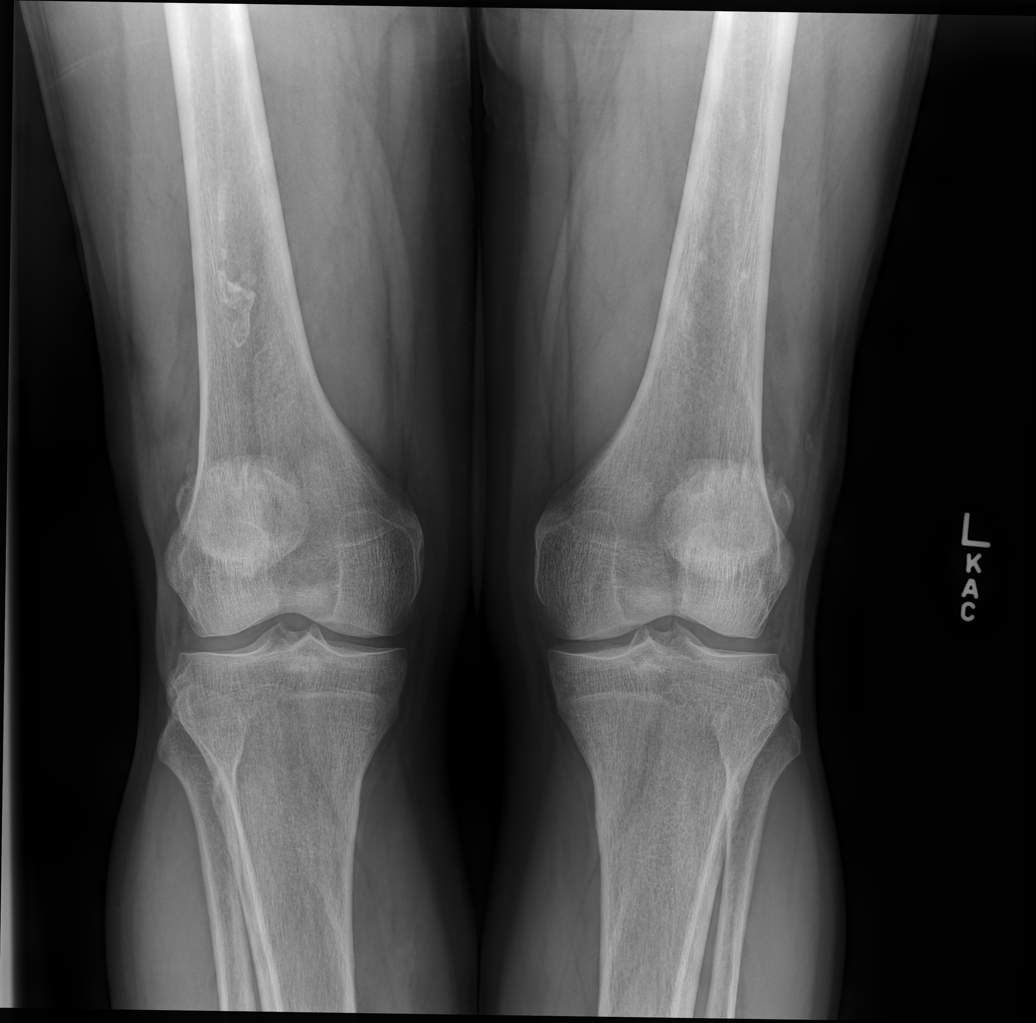

[knee lat]
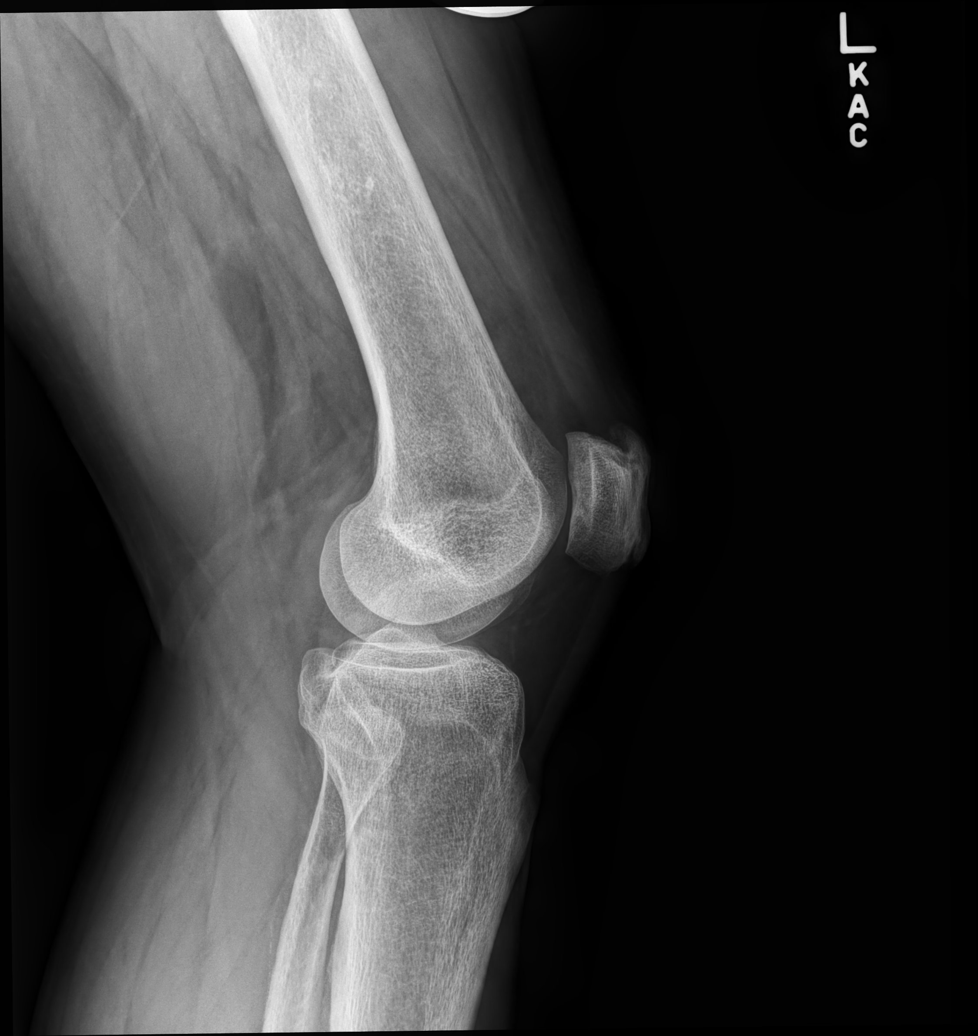

[knee [person_name] view pa]
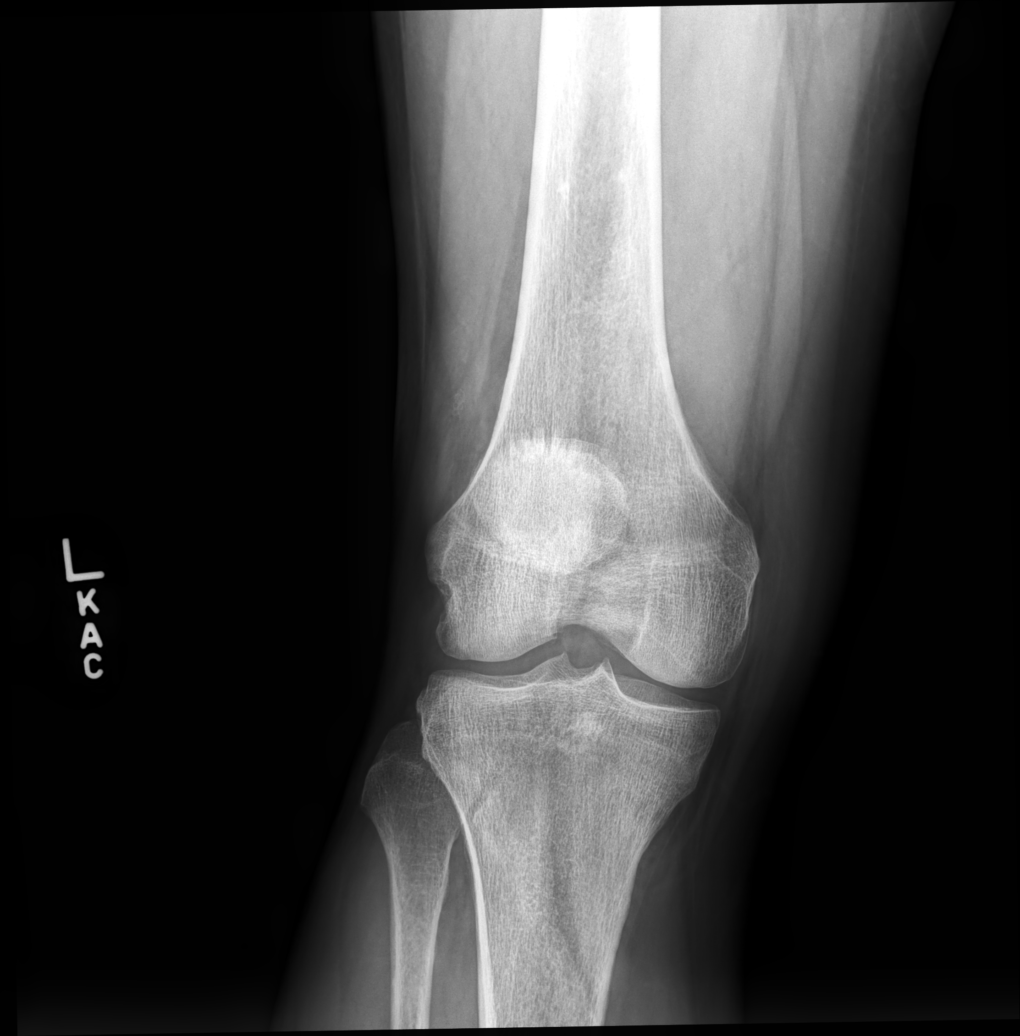

[patella (sunrise) tan]
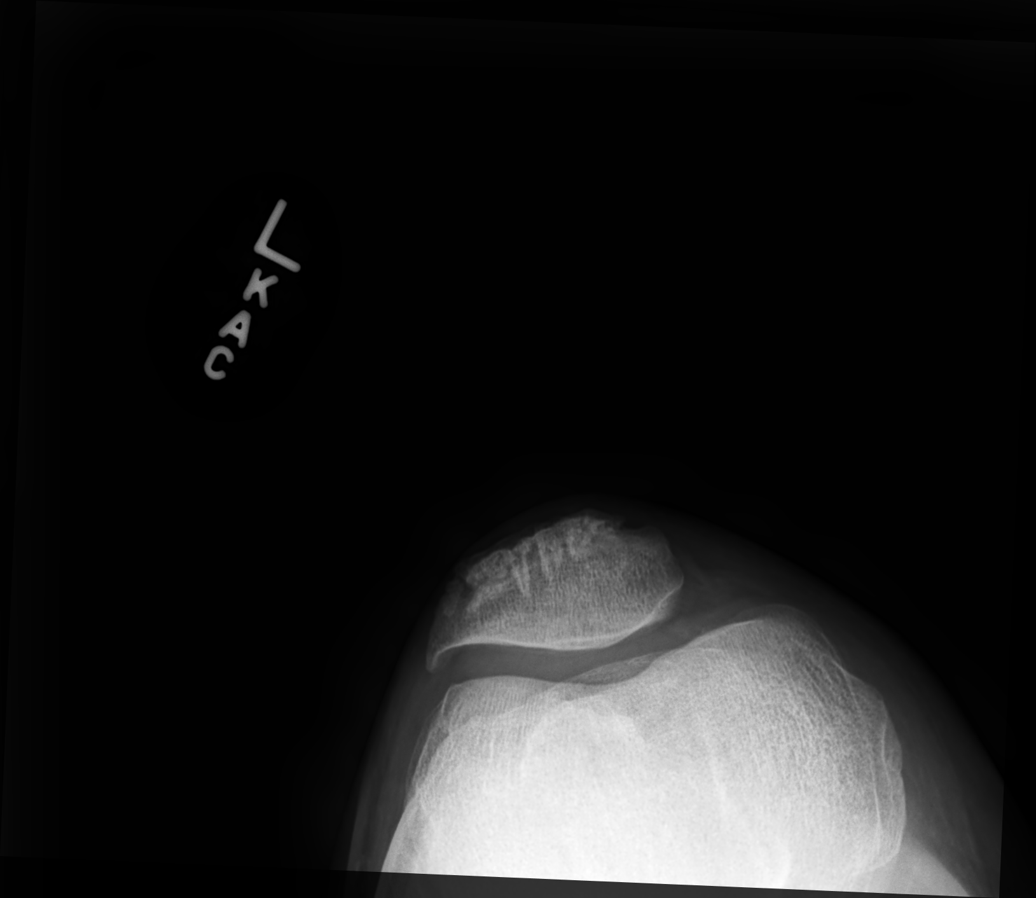

[4 of 4 positions shown; findings below may reference images not displayed]

FINDINGS: Mild medial joint space narrowing is noted. Patellofemoral
degenerative changes are seen as well. No joint effusion is noted.
No fracture is seen.
IMPRESSION: Degenerative change without acute abnormality.

## 2023-05-16 ENCOUNTER — Encounter: Payer: Self-pay | Admitting: Internal Medicine

## 2023-05-16 DIAGNOSIS — E041 Nontoxic single thyroid nodule: Secondary | ICD-10-CM

## 2023-05-20 ENCOUNTER — Inpatient Hospital Stay
Admission: RE | Admit: 2023-05-20 | Discharge: 2023-05-20 | Discharge status: Home / Self Care | Payer: BC Managed Care – PPO | Source: Ambulatory Visit | Attending: Internal Medicine | Admitting: Internal Medicine

## 2023-05-20 DIAGNOSIS — E041 Nontoxic single thyroid nodule: Secondary | ICD-10-CM | POA: Diagnosis not present

## 2023-05-27 ENCOUNTER — Other Ambulatory Visit: Payer: Self-pay | Admitting: Internal Medicine

## 2023-05-27 DIAGNOSIS — E041 Nontoxic single thyroid nodule: Secondary | ICD-10-CM

## 2023-05-29 ENCOUNTER — Encounter: Payer: Self-pay | Admitting: Neurology

## 2023-05-29 ENCOUNTER — Ambulatory Visit: Payer: BC Managed Care – PPO | Admitting: Neurology

## 2023-05-29 VITALS — BP 132/84 | HR 82 | Ht 75.0 in | Wt 232.0 lb

## 2023-05-29 DIAGNOSIS — I480 Paroxysmal atrial fibrillation: Secondary | ICD-10-CM | POA: Diagnosis not present

## 2023-05-29 DIAGNOSIS — I6350 Cerebral infarction due to unspecified occlusion or stenosis of unspecified cerebral artery: Secondary | ICD-10-CM | POA: Diagnosis not present

## 2023-05-29 DIAGNOSIS — I651 Occlusion and stenosis of basilar artery: Secondary | ICD-10-CM | POA: Diagnosis not present

## 2023-05-29 NOTE — Progress Notes (Signed)
[facility] [address] [place]. [place] [address] [phone]       OFFICE FOLLOW-UP NOTE  [name] Date of Birth:  07-28-1966 Medical Record Number:  [id number]   HPI: [name] is a 57 year old Caucasian male seen today for initial office follow-up visit following hospital consultation for stroke in September 2024.  He is accompanied by his wife.  History is obtained from them and review of electronic medical records.  I personally reviewed pertinent available imaging films in PACS.  He has past medical history of diabetes, hypertension, hyperlipidemia, obesity, sleep apnea, paroxysmal A-fib, anxiety and depression.  He presented on 04/05/2023 for evaluation for feeling of incoordination.  He went out to play golf and stated that he felt like he had never swung a golf club before he was so incoordinated.  He denied any diplopia, vertigo, numbness or weakness.  Patient was on Xarelto for atrial fibrillation.  CT scan showed questionable central pontine hypodensity.  MRI scan of the brain was obtained which confirmed acute nonhemorrhagic right pontine ventral infarct.  Remote age paramedian left pontine infarct was also noted.  There is remote age right basal ganglia lacunar infarct and left lateral cerebellar infarct as well.  CT angiogram showed high-grade stenosis of proximal basilar artery with atrial type origin of the right posterior cerebral.  2D echo showed ejection fraction of 6065% with left ventricular concentric hypertrophy.  Patient Xarelto was held and started on IV heparin for 3 days before going diagnostic cerebral catheter angiogram on 04/09/2023 which actually showed only mild stenosis of proximal basilar artery and patient was not felt to be a candidate for angioplasty stenting.  LDL cholesterol was 97 3 months ago.  Hemoglobin A1c was 8.0.  Patient was switched from IV heparin to Eliquis as it was felt to be a Xarelto failure.  Therapies recommended  outpatient therapies.  Patient states he has done well since discharge.  He is tolerating Eliquis well without bruising or bleeding.  His ataxia and balance have improved however he feels his stamina is not the same he tires very easily and cannot walk for long.  He is also having trouble with depth perception and while playing golf he had trouble hitting the ball.  He also had a 22nd episode of diplopia and he felt tired while playing golf last week.  He does have underlying chronic anxiety and depression for which she takes Celexa 20 mg daily but feels this is under control.  He denies any injuries , falls or LOC headaches.  He does have sleep apnea and uses his CPAP every night. ROS:   14 system review of systems is positive for diplopia, dizziness, decree stamina, anxiety, tiredness, decreased depth perception all other systems negative  PMH:  Past Medical History:  Diagnosis Date   Allergic rhinitis    Anxiety    Arthritis    Atrial fibrillation (HCC)    Diabetes (HCC)    Elevated LFTs 2000   fatty liver   GERD (gastroesophageal reflux disease)    Glucose intolerance (impaired glucose tolerance)    Gout    Hyperlipidemia    Hypertension    IBS (irritable bowel syndrome)    Obstructive sleep apnea    Osteoarthritis     Social History:  Social History   Socioeconomic History   Marital status: Married    Spouse name: Not on file   Number of children: 1   Years of education: Not on file  Highest education level: Not on file  Occupational History   Occupation: Museum/gallery conservator / Proofreader     Comment: Retired  Tobacco Use   Smoking status: Never   Smokeless tobacco: Never  Vaping Use   Vaping status: Never Used  Substance and Sexual Activity   Alcohol use: Yes    Comment: Occasionally   Drug use: No   Sexual activity: Not on file  Other Topics Concern   Not on file  Social History Narrative   Not on file   Social Determinants of Health   Financial Resource  Strain: Not on file  Food Insecurity: No Food Insecurity (04/11/2023)   Hunger Vital Sign    Worried About Running Out of Food in the Last Year: Never true    Ran Out of Food in the Last Year: Never true  Transportation Needs: No Transportation Needs (04/11/2023)   PRAPARE - Administrator, Civil Service (Medical): No    Lack of Transportation (Non-Medical): No  Physical Activity: Not on file  Stress: Not on file  Social Connections: Not on file  Intimate Partner Violence: Not At Risk (04/07/2023)   Humiliation, Afraid, Rape, and Kick questionnaire    Fear of Current or Ex-Partner: No    Emotionally Abused: No    Physically Abused: No    Sexually Abused: No    Medications:   Current Outpatient Medications on File Prior to Visit  Medication Sig Dispense Refill   apixaban (ELIQUIS) 5 MG TABS tablet Take 1 tablet (5 mg total) by mouth 2 (two) times daily. 60 tablet 11   cetirizine (ZYRTEC) 10 MG tablet Take 10 mg by mouth daily.     citalopram (CELEXA) 20 MG tablet TAKE 1 TABLET BY MOUTH DAILY 90 tablet 3   colchicine 0.6 MG tablet TAKE 1 TABLET BY MOUTH  TWICE DAILY AS NEEDED 180 tablet 1   cyanocobalamin 1000 MCG tablet Take 1,000 mcg by mouth daily.     diltiazem (CARDIZEM CD) 120 MG 24 hr capsule TAKE 1 CAPSULE BY MOUTH DAILY 90 capsule 3   FARXIGA 10 MG TABS tablet TAKE 1 TABLET BY MOUTH DAILY  BEFORE BREAKFAST 90 tablet 3   glucose blood (ONETOUCH VERIO) test strip Use to check blood sugar 1-2 times daily 200 each 11   hyoscyamine (LEVSIN) 0.125 MG tablet Take 1 tablet (0.125 mg total) by mouth 3 (three) times daily as needed. (Patient taking differently: Take 0.125 mg by mouth daily.) 60 tablet 1   lisinopril (ZESTRIL) 20 MG tablet TAKE 1 TABLET BY MOUTH DAILY 90 tablet 3   metFORMIN (GLUCOPHAGE-XR) 500 MG 24 hr tablet TAKE 2 TABLETS BY MOUTH DAILY  WITH BREAKFAST 180 tablet 3   Methylcellulose, Laxative, (CITRUCEL) 500 MG TABS Take 1 tablet by mouth at bedtime.      Multiple Vitamin (MULTIVITAMIN WITH MINERALS) TABS tablet Take 1 tablet by mouth daily.     omeprazole (PRILOSEC) 20 MG capsule TAKE 1 CAPSULE BY MOUTH DAILY 90 capsule 3   OneTouch Delica Lancets 33G MISC Use to obtain blood sugar sample 1-2 times daily. 200 each 11   rosuvastatin (CRESTOR) 20 MG tablet Take 1 tablet (20 mg total) by mouth daily. 90 tablet 3   valACYclovir (VALTREX) 1000 MG tablet TAKE 2 TABLETS BY MOUTH TWICE  DAILY FOR 1 DAY FOR COLD SORE 20 tablet 3   No current facility-administered medications on file prior to visit.    Allergies:   Allergies  Allergen Reactions  Atorvastatin Other (See Comments)    Memory problems    Physical Exam General: well developed, well nourished, seated, in no evident distress Head: head normocephalic and atraumatic.  Neck: supple with no carotid or supraclavicular bruits Cardiovascular: regular rate and rhythm, no murmurs Musculoskeletal: no deformity Skin:  no rash/petichiae Vascular:  Normal pulses all extremities Vitals:   05/29/23 1528  BP: 132/84  Pulse: 82   Neurologic Exam Mental Status: Awake and fully alert. Oriented to place and time. Recent and remote memory intact. Attention span, concentration and fund of knowledge appropriate. Mood and affect appropriate.  Cranial Nerves: Fundoscopic exam reveals sharp disc margins. Pupils equal, briskly reactive to light. Extraocular movements full without nystagmus. Visual fields full to confrontation. Hearing intact. Facial sensation intact. Face, tongue, palate moves normally and symmetrically.  Motor: Normal bulk and tone. Normal strength in all tested extremity muscles. Sensory.: intact to touch ,pinprick .position and vibratory sensation.  Coordination: Rapid alternating movements normal in all extremities. Finger-to-nose and heel-to-shin performed accurately bilaterally. Gait and Station: Arises from chair without difficulty. Stance is normal. Gait demonstrates normal stride  length and balance . Able to heel, toe and tandem walk with  difficulty.  Reflexes: 1+ and symmetric. Toes downgoing.   NIHSS  0 Modified Rankin  2   ASSESSMENT: 57 year old Caucasian male with right pontine infarct and September 2024 secondary to atrial fibrillation and mild basilar stenosis.  Vascular risk factors of diabetes, pretension, hyperlipidemia, obesity, sleep apnea, intracranial stenosis and paroxysmal atrial fibrillation     PLAN:I had a long d/w patient and his wife about his recent pontine stroke, paroxysmal atrial fibrillation, basilar stenosis, risk for recurrent stroke/TIAs, personally independently reviewed imaging studies and stroke evaluation results and answered questions.Continue Eliquis 5 mg twice daily for secondary stroke prevention and maintain strict control of hypertension with blood pressure goal below 130/90, diabetes with hemoglobin A1c goal below 6.5% and lipids with LDL cholesterol goal below 70 mg/dL. I also advised the patient to eat a healthy diet with plenty of whole grains, cereals, fruits and vegetables, exercise regularly and maintain ideal body weight .he was counseled to be compliant with using his CPAP every night.  Followup in the future with my nurse practitioner in 6 months or call earlier if necessary.. Greater than 50% of time during this 35 minute visit was spent on counseling,explanation of diagnosis, planning of further management, discussion with patient and family and coordination of care Delia Heady, MD Note: This document was prepared with digital dictation and possible smart phrase technology. Any transcriptional errors that result from this process are unintentional

## 2023-05-29 NOTE — Patient Instructions (Signed)
I had a long d/w patient and his wife about his recent pontine stroke, paroxysmal atrial fibrillation, basilar stenosis, risk for recurrent stroke/TIAs, personally independently reviewed imaging studies and stroke evaluation results and answered questions.Continue Eliquis 5 mg twice daily for secondary stroke prevention and maintain strict control of hypertension with blood pressure goal below 130/90, diabetes with hemoglobin A1c goal below 6.5% and lipids with LDL cholesterol goal below 70 mg/dL. I also advised the patient to eat a healthy diet with plenty of whole grains, cereals, fruits and vegetables, exercise regularly and maintain ideal body weight .he was counseled to be compliant with using his CPAP every night.  Followup in the future with my nurse practitioner in 6 months or call earlier if necessary.  Stroke Prevention Some medical conditions and behaviors can lead to a higher chance of having a stroke. You can help prevent a stroke by eating healthy, exercising, not smoking, and managing any medical conditions you have. Stroke is a leading cause of functional impairment. Primary prevention is particularly important because a majority of strokes are first-time events. Stroke changes the lives of not only those who experience a stroke but also their family and other caregivers. How can this condition affect me? A stroke is a medical emergency and should be treated right away. A stroke can lead to brain damage and can sometimes be life-threatening. If a person gets medical treatment right away, there is a better chance of surviving and recovering from a stroke. What can increase my risk? The following medical conditions may increase your risk of a stroke: Cardiovascular disease. High blood pressure (hypertension). Diabetes. High cholesterol. Sickle cell disease. Blood clotting disorders (hypercoagulable state). Obesity. Sleep disorders (obstructive sleep apnea). Other risk factors  include: Being older than age 66. Having a history of blood clots, stroke, or mini-stroke (transient ischemic attack, TIA). Genetic factors, such as race, ethnicity, or a family history of stroke. Smoking cigarettes or using other tobacco products. Taking birth control pills, especially if you also use tobacco. Heavy use of alcohol or drugs, especially cocaine and methamphetamine. Physical inactivity. What actions can I take to prevent this? Manage your health conditions High cholesterol levels. Eating a healthy diet is important for preventing high cholesterol. If cholesterol cannot be managed through diet alone, you may need to take medicines. Take any prescribed medicines to control your cholesterol as told by your health care provider. Hypertension. To reduce your risk of stroke, try to keep your blood pressure below 130/80. Eating a healthy diet and exercising regularly are important for controlling blood pressure. If these steps are not enough to manage your blood pressure, you may need to take medicines. Take any prescribed medicines to control hypertension as told by your health care provider. Ask your health care provider if you should monitor your blood pressure at home. Have your blood pressure checked every year, even if your blood pressure is normal. Blood pressure increases with age and some medical conditions. Diabetes. Eating a healthy diet and exercising regularly are important parts of managing your blood sugar (glucose). If your blood sugar cannot be managed through diet and exercise, you may need to take medicines. Take any prescribed medicines to control your diabetes as told by your health care provider. Get evaluated for obstructive sleep apnea. Talk to your health care provider about getting a sleep evaluation if you snore a lot or have excessive sleepiness. Make sure that any other medical conditions you have, such as atrial fibrillation or atherosclerosis, are  managed.  Nutrition Follow instructions from your health care provider about what to eat or drink to help manage your health condition. These instructions may include: Reducing your daily calorie intake. Limiting how much salt (sodium) you use to 1,500 milligrams (mg) each day. Using only healthy fats for cooking, such as olive oil, canola oil, or sunflower oil. Eating healthy foods. You can do this by: Choosing foods that are high in fiber, such as whole grains, and fresh fruits and vegetables. Eating at least 5 servings of fruits and vegetables a day. Try to fill one-half of your plate with fruits and vegetables at each meal. Choosing lean protein foods, such as lean cuts of meat, poultry without skin, fish, tofu, beans, and nuts. Eating low-fat dairy products. Avoiding foods that are high in sodium. This can help lower blood pressure. Avoiding foods that have saturated fat, trans fat, and cholesterol. This can help prevent high cholesterol. Avoiding processed and prepared foods. Counting your daily carbohydrate intake.  Lifestyle If you drink alcohol: Limit how much you have to: 0-1 drink a day for women who are not pregnant. 0-2 drinks a day for men. Know how much alcohol is in your drink. In the [place] one drink equals one 12 oz bottle of beer ( ), one 5 oz glass of wine ( ), or one 1 oz glass of hard liquor (44mL). Do not use any products that contain nicotine or tobacco. These products include cigarettes, chewing tobacco, and vaping devices, such as e-cigarettes. If you need help quitting, ask your health care provider. Avoid secondhand smoke. Do not use drugs. Activity  Try to stay at a healthy weight. Get at least 30 minutes of exercise on most days, such as: Fast walking. Biking. Swimming. Medicines Take over-the-counter and prescription medicines only as told by your health care provider. Aspirin or blood thinners (antiplatelets or anticoagulants) may be recommended  to reduce your risk of forming blood clots that can lead to stroke. Avoid taking birth control pills. Talk to your health care provider about the risks of taking birth control pills if: You are over 26 years old. You smoke. You get very bad headaches. You have had a blood clot. Where to find more information American Stroke Association: www.strokeassociation.org Get help right away if: You or a loved one has any symptoms of a stroke. "BE FAST" is an easy way to remember the main warning signs of a stroke: B - Balance. Signs are dizziness, sudden trouble walking, or loss of balance. E - Eyes. Signs are trouble seeing or a sudden change in vision. F - Face. Signs are sudden weakness or numbness of the face, or the face or eyelid drooping on one side. A - Arms. Signs are weakness or numbness in an arm. This happens suddenly and usually on one side of the body. S - Speech. Signs are sudden trouble speaking, slurred speech, or trouble understanding what people say. T - Time. Time to call emergency services. Write down what time symptoms started. You or a loved one has other signs of a stroke, such as: A sudden, severe headache with no known cause. Nausea or vomiting. Seizure. These symptoms may represent a serious problem that is an emergency. Do not wait to see if the symptoms will go away. Get medical help right away. Call your local emergency services (911 in the [place] Do not drive yourself to the hospital. Summary You can help to prevent a stroke by eating healthy, exercising, not smoking, limiting alcohol intake, and managing any  medical conditions you may have. Do not use any products that contain nicotine or tobacco. These include cigarettes, chewing tobacco, and vaping devices, such as e-cigarettes. If you need help quitting, ask your health care provider. Remember "BE FAST" for warning signs of a stroke. Get help right away if you or a loved one has any of these signs. This information  is not intended to replace advice given to you by your health care provider. Make sure you discuss any questions you have with your health care provider. Document Revised: 06/17/2022 Document Reviewed: 06/17/2022 Elsevier Patient Education  2024 [employer].

## 2023-06-04 ENCOUNTER — Other Ambulatory Visit (HOSPITAL_COMMUNITY)
Admission: RE | Admit: 2023-06-04 | Discharge: 2023-06-04 | Disposition: A | Discharge status: Home / Self Care | Payer: BC Managed Care – PPO | Source: Ambulatory Visit | Attending: Internal Medicine | Admitting: Internal Medicine

## 2023-06-04 ENCOUNTER — Ambulatory Visit
Admission: RE | Admit: 2023-06-04 | Discharge: 2023-06-04 | Disposition: A | Discharge status: Home / Self Care | Payer: BC Managed Care – PPO | Source: Ambulatory Visit | Attending: Internal Medicine | Admitting: Internal Medicine

## 2023-06-04 DIAGNOSIS — E041 Nontoxic single thyroid nodule: Secondary | ICD-10-CM

## 2023-06-09 LAB — CYTOLOGY - NON PAP

## 2023-06-11 ENCOUNTER — Other Ambulatory Visit (HOSPITAL_COMMUNITY): Payer: Self-pay

## 2023-06-12 ENCOUNTER — Other Ambulatory Visit: Payer: Self-pay | Admitting: Internal Medicine

## 2023-06-23 ENCOUNTER — Ambulatory Visit (INDEPENDENT_AMBULATORY_CARE_PROVIDER_SITE_OTHER): Payer: BC Managed Care – PPO | Admitting: Internal Medicine

## 2023-06-23 ENCOUNTER — Encounter: Payer: Self-pay | Admitting: Internal Medicine

## 2023-06-23 VITALS — BP 132/88 | HR 74 | Temp 98.4°F | Ht 75.0 in | Wt 248.0 lb

## 2023-06-23 DIAGNOSIS — J0110 Acute frontal sinusitis, unspecified: Secondary | ICD-10-CM | POA: Diagnosis not present

## 2023-06-23 DIAGNOSIS — E1159 Type 2 diabetes mellitus with other circulatory complications: Secondary | ICD-10-CM | POA: Diagnosis not present

## 2023-06-23 DIAGNOSIS — G4733 Obstructive sleep apnea (adult) (pediatric): Secondary | ICD-10-CM

## 2023-06-23 DIAGNOSIS — Z8673 Personal history of transient ischemic attack (TIA), and cerebral infarction without residual deficits: Secondary | ICD-10-CM | POA: Diagnosis not present

## 2023-06-23 DIAGNOSIS — I480 Paroxysmal atrial fibrillation: Secondary | ICD-10-CM | POA: Diagnosis not present

## 2023-06-23 DIAGNOSIS — I10 Essential (primary) hypertension: Secondary | ICD-10-CM

## 2023-06-23 LAB — POCT GLYCOSYLATED HEMOGLOBIN (HGB A1C): Hemoglobin A1C: 7.2 % — AB (ref 4.0–5.6)

## 2023-06-23 NOTE — Assessment & Plan Note (Signed)
Discussed that this is just viral infection at this point If persists to next week--would try empiric Rx with doxy

## 2023-06-23 NOTE — Addendum Note (Signed)
Addended by: Eual Fines on: 06/23/2023 09:04 AM   Modules accepted: Orders

## 2023-06-23 NOTE — Assessment & Plan Note (Signed)
Minimal residual Is walking regularly Working on diabetes and BP control eliquis

## 2023-06-23 NOTE — Assessment & Plan Note (Signed)
BP Readings from Last 3 Encounters:  06/23/23 132/88  05/29/23 132/84  04/16/23 114/84   Acceptable for now Diltiazem lisinopril 20

## 2023-06-23 NOTE — Assessment & Plan Note (Signed)
A1c down to 7.2% Farxiga 10 and metformin 1000 daily Discussed adding more med vs lifestyle effort---will wait If no better in 3 months, will add oral semaglutide

## 2023-06-23 NOTE — Assessment & Plan Note (Signed)
No palpitations Regular on exam Eliquis 5mg  bid and diltiazem 120mg  daily

## 2023-06-23 NOTE — Progress Notes (Signed)
Subjective:    Patient ID: Charles Phelps, male    DOB: 24-Aug-1965, 57 y.o.   MRN: [id number]  HPI Here for follow up of recent stroke and diabetes  Still struggling with some things Can't play golf---trouble with yard work----feels it is depth perception No focal weakness No apparent speech issues---though notices it if he was tired  No apparent trouble with the statin  Eating more careful Has lost some weight  Hasn't been checking sugars lately  No chest pain Some trouble breathing---may have sinus infection in the past day or so No fever Congestion in nose and green mucus Started 2 days ago No palpitations  Current Outpatient Medications on File Prior to Visit  Medication Sig Dispense Refill   apixaban (ELIQUIS) 5 MG TABS tablet Take 1 tablet (5 mg total) by mouth 2 (two) times daily. 60 tablet 11   cetirizine (ZYRTEC) 10 MG tablet Take 10 mg by mouth daily.     citalopram (CELEXA) 20 MG tablet TAKE 1 TABLET BY MOUTH DAILY 90 tablet 3   colchicine 0.6 MG tablet TAKE 1 TABLET BY MOUTH  TWICE DAILY AS NEEDED 180 tablet 1   cyanocobalamin 1000 MCG tablet Take 1,000 mcg by mouth daily.     diltiazem (CARDIZEM CD) 120 MG 24 hr capsule TAKE 1 CAPSULE BY MOUTH DAILY 90 capsule 3   FARXIGA 10 MG TABS tablet TAKE 1 TABLET BY MOUTH DAILY  BEFORE BREAKFAST 90 tablet 3   glucose blood (ONETOUCH VERIO) test strip Use to check blood sugar 1-2 times daily 200 each 11   hyoscyamine (LEVSIN) 0.125 MG tablet Take 1 tablet (0.125 mg total) by mouth 3 (three) times daily as needed. (Patient taking differently: Take 0.125 mg by mouth daily.) 60 tablet 1   lisinopril (ZESTRIL) 20 MG tablet TAKE 1 TABLET BY MOUTH DAILY 90 tablet 3   metFORMIN (GLUCOPHAGE-XR) 500 MG 24 hr tablet TAKE 2 TABLETS BY MOUTH DAILY  WITH BREAKFAST 180 tablet 3   Methylcellulose, Laxative, (CITRUCEL) 500 MG TABS Take 1 tablet by mouth at bedtime.     Multiple Vitamin (MULTIVITAMIN WITH MINERALS) TABS tablet Take 1  tablet by mouth daily.     omeprazole (PRILOSEC) 20 MG capsule TAKE 1 CAPSULE BY MOUTH DAILY 90 capsule 3   OneTouch Delica Lancets 33G MISC Use to obtain blood sugar sample 1-2 times daily. 200 each 11   rosuvastatin (CRESTOR) 20 MG tablet Take 1 tablet (20 mg total) by mouth daily. 90 tablet 3   valACYclovir (VALTREX) 1000 MG tablet TAKE 2 TABLETS BY MOUTH TWICE  DAILY FOR 1 DAY FOR COLD SORE 20 tablet 3   No current facility-administered medications on file prior to visit.    Allergies  Allergen Reactions   Atorvastatin Other (See Comments)    Memory problems    Past Medical History:  Diagnosis Date   Allergic rhinitis    Anxiety    Arthritis    Atrial fibrillation (HCC)    Diabetes (HCC)    Elevated LFTs 2000   fatty liver   GERD (gastroesophageal reflux disease)    Glucose intolerance (impaired glucose tolerance)    Gout    Hyperlipidemia    Hypertension    IBS (irritable bowel syndrome)    Obstructive sleep apnea    Osteoarthritis     Past Surgical History:  Procedure Laterality Date   IR ANGIO INTRA EXTRACRAN SEL INTERNAL CAROTID BILAT MOD SED  04/09/2023   IR ANGIO VERTEBRAL SEL VERTEBRAL  BILAT MOD SED  04/09/2023   IR ENDOVASC INTRACRANIAL INF OTHER THAN THROMBO ART INC DIAG ANGIO EA ADD  04/09/2023   IR US GUIDE VASC ACCESS RIGHT  04/09/2023   NO PAST SURGERIES     RADIOLOGY WITH ANESTHESIA N/A 04/09/2023   Procedure: IR WITH ANESTHESIA;  Surgeon: Radiologist, Medication, MD;  Location: [facility] OR;  Service: Radiology;  Laterality: N/A;    Family History  Problem Relation Age of Onset   Hypertension Mother    Diabetes Mother    Heart disease Father        CABG at 17   Cancer Father        lung   Colon polyps Father    Diabetes Sister    Diabetes Brother    Heart failure Brother    Colon cancer Neg Hx    Stomach cancer Neg Hx    Esophageal cancer Neg Hx     Social History   Socioeconomic History   Marital status: Married    Spouse name: Not on file    Number of children: 1   Years of education: Not on file   Highest education level: Not on file  Occupational History   Occupation: Museum/gallery conservator / Proofreader     Comment: Retired  Tobacco Use   Smoking status: Never   Smokeless tobacco: Never  Vaping Use   Vaping status: Never Used  Substance and Sexual Activity   Alcohol use: Yes    Comment: Occasionally   Drug use: No   Sexual activity: Not on file  Other Topics Concern   Not on file  Social History Narrative   Not on file   Social Determinants of Health   Financial Resource Strain: Not on file  Food Insecurity: No Food Insecurity (04/11/2023)   Hunger Vital Sign    Worried About Running Out of Food in the Last Year: Never true    Ran Out of Food in the Last Year: Never true  Transportation Needs: No Transportation Needs (04/11/2023)   PRAPARE - Administrator, Civil Service (Medical): No    Lack of Transportation (Non-Medical): No  Physical Activity: Not on file  Stress: Not on file  Social Connections: Not on file  Intimate Partner Violence: Not At Risk (04/07/2023)   Humiliation, Afraid, Rape, and Kick questionnaire    Fear of Current or Ex-Partner: No    Emotionally Abused: No    Physically Abused: No    Sexually Abused: No   Review of Systems Generally sleeps okay No foot numbness or burning Walking daily     Objective:   Physical Exam Constitutional:      Appearance: Normal appearance.  Cardiovascular:     Rate and Rhythm: Normal rate and regular rhythm.     Pulses: Normal pulses.     Heart sounds: No murmur heard.    No gallop.  Pulmonary:     Effort: Pulmonary effort is normal.     Breath sounds: Normal breath sounds. No wheezing or rales.  Musculoskeletal:     Cervical back: Neck supple.     Right lower leg: No edema.     Left lower leg: No edema.  Lymphadenopathy:     Cervical: No cervical adenopathy.  Skin:    Comments: No foot lesions  Neurological:     Mental Status: He is  alert.  Psychiatric:        Mood and Affect: Mood normal.  Behavior: Behavior normal.            Assessment & Plan:

## 2023-06-30 ENCOUNTER — Encounter: Payer: Self-pay | Admitting: Internal Medicine

## 2023-06-30 ENCOUNTER — Other Ambulatory Visit: Payer: Self-pay | Admitting: Adult Health

## 2023-06-30 ENCOUNTER — Telehealth: Payer: Self-pay

## 2023-06-30 ENCOUNTER — Ambulatory Visit (INDEPENDENT_AMBULATORY_CARE_PROVIDER_SITE_OTHER): Payer: BC Managed Care – PPO | Admitting: Internal Medicine

## 2023-06-30 VITALS — BP 102/80 | HR 85 | Temp 99.4°F | Ht 75.0 in | Wt 245.0 lb

## 2023-06-30 DIAGNOSIS — G4733 Obstructive sleep apnea (adult) (pediatric): Secondary | ICD-10-CM

## 2023-06-30 DIAGNOSIS — J0140 Acute pansinusitis, unspecified: Secondary | ICD-10-CM | POA: Diagnosis not present

## 2023-06-30 MED ORDER — DOXYCYCLINE HYCLATE 100 MG PO TABS: 100.0000 mg | ORAL_TABLET | Freq: Two times a day (BID) | ORAL | 0 refills | Status: DC

## 2023-06-30 NOTE — Assessment & Plan Note (Signed)
Ongoing symptoms well over a week Will try empiric doxy 100 bid Discussed supportive care---try sleeping more upright

## 2023-06-30 NOTE — Telephone Encounter (Signed)
Per appt notes pt already has appt with Dr Alphonsus Sias 06/30/23 at 10:45 sending note to Dr Alphonsus Sias.

## 2023-06-30 NOTE — Progress Notes (Signed)
Subjective:    Patient ID: Charles Phelps, male    DOB: 1966-02-15, 57 y.o.   MRN: [id number]  HPI Here due to persistent respiratory symptoms With wife  Feeling weak Some fever Cough is bad Trouble sleeping ---due to bad nasal congestion (has nasal pillows with CPAP) Slight sore throat and ear pain No headache No SOB other than nasal  Current Outpatient Medications on File Prior to Visit  Medication Sig Dispense Refill   apixaban (ELIQUIS) 5 MG TABS tablet Take 1 tablet (5 mg total) by mouth 2 (two) times daily. 60 tablet 11   cetirizine (ZYRTEC) 10 MG tablet Take 10 mg by mouth daily.     citalopram (CELEXA) 20 MG tablet TAKE 1 TABLET BY MOUTH DAILY 90 tablet 3   colchicine 0.6 MG tablet TAKE 1 TABLET BY MOUTH  TWICE DAILY AS NEEDED 180 tablet 1   cyanocobalamin 1000 MCG tablet Take 1,000 mcg by mouth daily.     diltiazem (CARDIZEM CD) 120 MG 24 hr capsule TAKE 1 CAPSULE BY MOUTH DAILY 90 capsule 3   FARXIGA 10 MG TABS tablet TAKE 1 TABLET BY MOUTH DAILY  BEFORE BREAKFAST 90 tablet 3   glucose blood (ONETOUCH VERIO) test strip Use to check blood sugar 1-2 times daily 200 each 11   hyoscyamine (LEVSIN) 0.125 MG tablet Take 1 tablet (0.125 mg total) by mouth 3 (three) times daily as needed. (Patient taking differently: Take 0.125 mg by mouth daily.) 60 tablet 1   lisinopril (ZESTRIL) 20 MG tablet TAKE 1 TABLET BY MOUTH DAILY 90 tablet 3   metFORMIN (GLUCOPHAGE-XR) 500 MG 24 hr tablet TAKE 2 TABLETS BY MOUTH DAILY  WITH BREAKFAST 180 tablet 3   Methylcellulose, Laxative, (CITRUCEL) 500 MG TABS Take 1 tablet by mouth at bedtime.     Multiple Vitamin (MULTIVITAMIN WITH MINERALS) TABS tablet Take 1 tablet by mouth daily.     omeprazole (PRILOSEC) 20 MG capsule TAKE 1 CAPSULE BY MOUTH DAILY 90 capsule 3   OneTouch Delica Lancets 33G MISC Use to obtain blood sugar sample 1-2 times daily. 200 each 11   rosuvastatin (CRESTOR) 20 MG tablet Take 1 tablet (20 mg total) by mouth daily. 90  tablet 3   valACYclovir (VALTREX) 1000 MG tablet TAKE 2 TABLETS BY MOUTH TWICE  DAILY FOR 1 DAY FOR COLD SORE 20 tablet 3   No current facility-administered medications on file prior to visit.    Allergies  Allergen Reactions   Atorvastatin Other (See Comments)    Memory problems    Past Medical History:  Diagnosis Date   Allergic rhinitis    Anxiety    Arthritis    Atrial fibrillation (HCC)    Diabetes (HCC)    Elevated LFTs 2000   fatty liver   GERD (gastroesophageal reflux disease)    Glucose intolerance (impaired glucose tolerance)    Gout    Hyperlipidemia    Hypertension    IBS (irritable bowel syndrome)    Obstructive sleep apnea    Osteoarthritis     Past Surgical History:  Procedure Laterality Date   IR ANGIO INTRA EXTRACRAN SEL INTERNAL CAROTID BILAT MOD SED  04/09/2023   IR ANGIO VERTEBRAL SEL VERTEBRAL BILAT MOD SED  04/09/2023   IR ENDOVASC INTRACRANIAL INF OTHER THAN THROMBO ART INC DIAG ANGIO EA ADD  04/09/2023   IR US GUIDE VASC ACCESS RIGHT  04/09/2023   NO PAST SURGERIES     RADIOLOGY WITH ANESTHESIA N/A 04/09/2023  Procedure: IR WITH ANESTHESIA;  Surgeon: Radiologist, Medication, MD;  Location: [facility] OR;  Service: Radiology;  Laterality: N/A;    Family History  Problem Relation Age of Onset   Hypertension Mother    Diabetes Mother    Heart disease Father        CABG at 60   Cancer Father        lung   Colon polyps Father    Diabetes Sister    Diabetes Brother    Heart failure Brother    Colon cancer Neg Hx    Stomach cancer Neg Hx    Esophageal cancer Neg Hx     Social History   Socioeconomic History   Marital status: Married    Spouse name: Not on file   Number of children: 1   Years of education: Not on file   Highest education level: Not on file  Occupational History   Occupation: Museum/gallery conservator / Proofreader     Comment: Retired  Tobacco Use   Smoking status: Never   Smokeless tobacco: Never  Vaping Use   Vaping status: Never  Used  Substance and Sexual Activity   Alcohol use: Yes    Comment: Occasionally   Drug use: No   Sexual activity: Not on file  Other Topics Concern   Not on file  Social History Narrative   Not on file   Social Determinants of Health   Financial Resource Strain: Not on file  Food Insecurity: No Food Insecurity (04/11/2023)   Hunger Vital Sign    Worried About Running Out of Food in the Last Year: Never true    Ran Out of Food in the Last Year: Never true  Transportation Needs: No Transportation Needs (04/11/2023)   PRAPARE - Administrator, Civil Service (Medical): No    Lack of Transportation (Non-Medical): No  Physical Activity: Not on file  Stress: Not on file  Social Connections: Not on file  Intimate Partner Violence: Not At Risk (04/07/2023)   Humiliation, Afraid, Rape, and Kick questionnaire    Fear of Current or Ex-Partner: No    Emotionally Abused: No    Physically Abused: No    Sexually Abused: No   Review of Systems No N/V Eating okay     Objective:   Physical Exam Constitutional:      Appearance: Normal appearance.  HENT:     Head:     Comments: No sinus tenderness    Right Ear: Tympanic membrane and ear canal normal.     Left Ear: Tympanic membrane and ear canal normal.     Mouth/Throat:     Pharynx: No oropharyngeal exudate or posterior oropharyngeal erythema.  Pulmonary:     Effort: Pulmonary effort is normal.     Breath sounds: Normal breath sounds. No wheezing or rales.  Musculoskeletal:     Cervical back: Neck supple.  Lymphadenopathy:     Cervical: No cervical adenopathy.  Neurological:     Mental Status: He is alert.            Assessment & Plan:

## 2023-06-30 NOTE — Telephone Encounter (Signed)
See OV note.  

## 2023-07-02 ENCOUNTER — Other Ambulatory Visit: Payer: Self-pay | Admitting: Internal Medicine

## 2023-07-03 ENCOUNTER — Ambulatory Visit: Payer: BC Managed Care – PPO | Admitting: Adult Health

## 2023-07-07 ENCOUNTER — Ambulatory Visit: Payer: BC Managed Care – PPO | Attending: Cardiology | Admitting: Cardiology

## 2023-07-07 ENCOUNTER — Encounter: Payer: Self-pay | Admitting: Cardiology

## 2023-07-07 VITALS — BP 140/83 | HR 73 | Resp 16 | Ht 75.0 in | Wt 245.0 lb

## 2023-07-07 DIAGNOSIS — F1010 Alcohol abuse, uncomplicated: Secondary | ICD-10-CM

## 2023-07-07 DIAGNOSIS — I480 Paroxysmal atrial fibrillation: Secondary | ICD-10-CM

## 2023-07-07 DIAGNOSIS — E782 Mixed hyperlipidemia: Secondary | ICD-10-CM

## 2023-07-07 DIAGNOSIS — Z7901 Long term (current) use of anticoagulants: Secondary | ICD-10-CM

## 2023-07-07 DIAGNOSIS — I10 Essential (primary) hypertension: Secondary | ICD-10-CM

## 2023-07-07 DIAGNOSIS — G4730 Sleep apnea, unspecified: Secondary | ICD-10-CM

## 2023-07-07 DIAGNOSIS — E6609 Other obesity due to excess calories: Secondary | ICD-10-CM

## 2023-07-07 DIAGNOSIS — E66811 Obesity, class 1: Secondary | ICD-10-CM

## 2023-07-07 DIAGNOSIS — Z6830 Body mass index (BMI) 30.0-30.9, adult: Secondary | ICD-10-CM

## 2023-07-07 DIAGNOSIS — I679 Cerebrovascular disease, unspecified: Secondary | ICD-10-CM

## 2023-07-07 NOTE — Patient Instructions (Addendum)
Medication Instructions:  Your physician recommends that you continue on your current medications as directed. Please refer to the Current Medication list given to you today.  *If you need a refill on your cardiac medications before your next appointment, please call your pharmacy*  Lab Work: None ordered today. If you have labs (blood work) drawn today and your tests are completely normal, you will receive your results only by: MyChart Message (if you have MyChart) OR A paper copy in the mail If you have any lab test that is abnormal or we need to change your treatment, we will call you to review the results.  Testing/Procedures: Your physician has requested that you have a coronary calcium score performed. This is not covered by insurance and will be an out-of-pocket cost of approximately $99.   Your physician has requested that you have an exercise tolerance test. For further information please visit [email or web address] Please also follow instruction sheet, as given.  Your physician has requested that you have a TEE. During a TEE, sound waves are used to create images of your heart. It provides your doctor with information about the size and shape of your heart and how well your heart's chambers and valves are working. In this test, a transducer is attached to the end of a flexible tube that's guided down your throat and into your esophagus (the tube leading from you mouth to your stomach) to get a more detailed image of your heart. You are not awake for the procedure. Please see the instruction sheet given to you today. For further information please visit [email or web address]    Follow-Up: At [facility], you and your health needs are our priority.  As part of our continuing mission to provide you with exceptional heart care, we have created designated Provider Care Teams.  These Care Teams include your primary Cardiologist (physician) and Advanced Practice Providers (APPs -  Physician  Assistants and Nurse Practitioners) who all work together to provide you with the care you need, when you need it.  Your next appointment:   6 month(s)  The format for your next appointment:   In Person  Provider:   Tessa Lerner, DO {  Other Instructions TEE instructions will be sent through MyChart/ mailed to the address on file.  ----------------------------------------------------------------------------------------  Exercise Stress Test   Please arrive 15 minutes prior to your appointment time to allow for registration and insurance purposes.  The test will take approximately 45 minutes to complete.  How to prepare for your Exercise Stress Test: - Do bring a list of your current medications with you. If you do not take any of the medications listed below, you may take your medications as normal the day of the test. - DO wear comfortable clothes (no dresses or overalls) and walking shoes, tennis shoes preferred (no heels or open toed shoes allowed).   If these instructions are not followed as listed above, your test will be rescheduled at a later date.  If you can not keep you appointment, please provide 24 hours notification to the Stress Lab to avoid a possible $50 charge to your patient account.

## 2023-07-07 NOTE — Progress Notes (Signed)
Cardiology Office Note:    Date:  07/07/2023  NAME:  Charles Phelps    MRN: [id number] DOB:  Oct 12, 1965   PCP:  [name], MD  Former Cardiology Providers: N/A Primary Cardiologist:  Tessa Lerner, DO, [facility] (established care 07/07/2023) Electrophysiologist:  None   Referring MD: [name], MD  Reason of Consult: Recent stroke  Chief Complaint  Patient presents with   Cerebrovascular disease    New Patient (Initial Visit)    History of Present Illness:    Charles Phelps is a 57 y.o. Caucasian male whose past medical history and cardiovascular risk factors includes: Hypertension, diabetes, OSA on CPAP, atrial fibrillation (2018), Etoh abuse. He is being seen today for the evaluation of a recent stroke at the request of [name], MD.  Patient presents to the office to establish.  Given his history of atrial fibrillation and recent stroke.  Patient presented to the hospital in September 2024 with ataxia for 5 days prior.  He presented to the ED and was noted to have acute/subacute nonhemorrhagic right pontine infarct.  He was noted to have basilar artery stenosis and was seen by interventional radiology.  On April 09, 2023 he underwent cerebral angiogram which noted mild stenosis of the proximal basilar artery, significantly improved from prior CT angiogram.  Therefore no angioplasty or stenting warranted.  Xarelto was changed to Eliquis.  Patient was later discharged home and referred to cardiology to establish care given his comorbidities.  Clinically he denies anginal chest pain or heart failure symptoms.  No new focal neurological deficits and overall improving well.  Current Medications: Current Meds  Medication Sig   apixaban (ELIQUIS) 5 MG TABS tablet Take 1 tablet (5 mg total) by mouth 2 (two) times daily.   cetirizine (ZYRTEC) 10 MG tablet Take 10 mg by mouth daily.   citalopram (CELEXA) 20 MG tablet TAKE 1 TABLET BY MOUTH DAILY    colchicine 0.6 MG tablet TAKE 1 TABLET BY MOUTH  TWICE DAILY AS NEEDED   cyanocobalamin 1000 MCG tablet Take 1,000 mcg by mouth daily.   diltiazem (CARDIZEM CD) 120 MG 24 hr capsule TAKE 1 CAPSULE BY MOUTH DAILY   doxycycline (VIBRA-TABS) 100 MG tablet Take 1 tablet (100 mg total) by mouth 2 (two) times daily.   FARXIGA 10 MG TABS tablet TAKE 1 TABLET BY MOUTH DAILY  BEFORE BREAKFAST   glucose blood (ONETOUCH VERIO) test strip Use to check blood sugar 1-2 times daily   hyoscyamine (LEVSIN) 0.125 MG tablet Take 1 tablet (0.125 mg total) by mouth 3 (three) times daily as needed. (Patient taking differently: Take 0.125 mg by mouth daily.)   lisinopril (ZESTRIL) 20 MG tablet TAKE 1 TABLET BY MOUTH DAILY   metFORMIN (GLUCOPHAGE-XR) 500 MG 24 hr tablet TAKE 2 TABLETS BY MOUTH DAILY  WITH BREAKFAST   Methylcellulose, Laxative, (CITRUCEL) 500 MG TABS Take 1 tablet by mouth at bedtime.   Multiple Vitamin (MULTIVITAMIN WITH MINERALS) TABS tablet Take 1 tablet by mouth daily.   omeprazole (PRILOSEC) 20 MG capsule TAKE 1 CAPSULE BY MOUTH DAILY   OneTouch Delica Lancets 33G MISC Use to obtain blood sugar sample 1-2 times daily.   rosuvastatin (CRESTOR) 20 MG tablet Take 1 tablet (20 mg total) by mouth daily.   valACYclovir (VALTREX) 1000 MG tablet TAKE 2 TABLETS BY MOUTH TWICE  DAILY FOR 1 DAY FOR COLD SORE     Allergies:    Atorvastatin   Past Medical History: Past  Medical History:  Diagnosis Date   Allergic rhinitis    Anxiety    Arthritis    Atrial fibrillation (HCC)    Diabetes (HCC)    Elevated LFTs 2000   fatty liver   GERD (gastroesophageal reflux disease)    Glucose intolerance (impaired glucose tolerance)    Gout    Hyperlipidemia    Hypertension    IBS (irritable bowel syndrome)    Obstructive sleep apnea    Osteoarthritis     Past Surgical History: Past Surgical History:  Procedure Laterality Date   IR ANGIO INTRA EXTRACRAN SEL INTERNAL CAROTID BILAT MOD SED  04/09/2023   IR  ANGIO VERTEBRAL SEL VERTEBRAL BILAT MOD SED  04/09/2023   IR ENDOVASC INTRACRANIAL INF OTHER THAN THROMBO ART INC DIAG ANGIO EA ADD  04/09/2023   IR US GUIDE VASC ACCESS RIGHT  04/09/2023   NO PAST SURGERIES     RADIOLOGY WITH ANESTHESIA N/A 04/09/2023   Procedure: IR WITH ANESTHESIA;  Surgeon: Radiologist, Medication, MD;  Location: [facility] OR;  Service: Radiology;  Laterality: N/A;    Social History: Social History   Tobacco Use   Smoking status: Never   Smokeless tobacco: Never  Vaping Use   Vaping status: Never Used  Substance Use Topics   Alcohol use: Yes    Comment: Occasionally   Drug use: No    Family History: Family History  Problem Relation Age of Onset   Hypertension Mother    Diabetes Mother    Heart disease Father        CABG at 47   Cancer Father        lung   Colon polyps Father    Diabetes Sister    Diabetes Brother    Heart failure Brother    Colon cancer Neg Hx    Stomach cancer Neg Hx    Esophageal cancer Neg Hx     ROS:   Review of Systems  Cardiovascular:  Negative for chest pain, claudication, irregular heartbeat, leg swelling, near-syncope, orthopnea, palpitations, paroxysmal nocturnal dyspnea and syncope.  Respiratory:  Negative for shortness of breath.   Hematologic/Lymphatic: Negative for bleeding problem.    EKGs/Labs/Other Studies Reviewed:   EKG: EKG Interpretation Date/Time:  Monday July 07 2023 11:20:30 EST Ventricular Rate:  72 PR Interval:  166 QRS Duration:  96 QT Interval:  406 QTC Calculation: 444 R Axis:   6  Text Interpretation: Normal sinus rhythm Nonspecific T wave abnormality When compared with ECG of 05-Apr-2023 12:20, No significant change was found Confirmed by Tessa Lerner [phone]) on 07/07/2023 11:27:18 AM  Echocardiogram: September 2024: LVEF 60-65%.  Grade 1 diastolic dysfunction.  Right ventricular size and function normal. No significant valvular heart disease. Estimated RAP 3 mmHg. See report for additional  details  RADIOLOY: MR Brain w/ and w/o contrast 03/2023 1. Acute/subacute nonhemorrhagic right pontine infarct. 2. Remote left paramedian pontine infarct. 3. Remote nonhemorrhagic infarct of the right lentiform nucleus. 4. Remote lacunar infarct of the lateral left cerebellum.  Labs:    Latest Ref Rng & Units 04/09/2023    6:33 AM 04/08/2023    6:31 AM 04/06/2023    1:00 AM  CBC  WBC 4.0 - 10.5 K/uL 6.0  5.7  7.1   Hemoglobin 13.0 - 17.0 g/dL 65.7  84.6  96.2   Hematocrit 39.0 - 52.0 % 43.8  40.9  40.6   Platelets 150 - 400 K/uL 153  139  155  Latest Ref Rng & Units 04/09/2023    6:33 AM 04/07/2023    8:16 AM 04/06/2023    1:00 AM  BMP  Glucose 70 - 99 mg/dL 811  914  782   BUN 6 - 20 mg/dL 10  15  13    Creatinine 0.61 - 1.24 mg/dL 9.56  2.13  0.86   Sodium 135 - 145 mmol/L 139  139  140   Potassium 3.5 - 5.1 mmol/L 3.5  3.9  3.0   Chloride 98 - 111 mmol/L 104  103  104   CO2 22 - 32 mmol/L 21  27  24    Calcium 8.9 - 10.3 mg/dL 9.0  8.8  8.7       Latest Ref Rng & Units 04/09/2023    6:33 AM 04/07/2023    8:16 AM 04/06/2023    1:00 AM  CMP  Glucose 70 - 99 mg/dL 578  469  629   BUN 6 - 20 mg/dL 10  15  13    Creatinine 0.61 - 1.24 mg/dL 5.28  4.13  2.44   Sodium 135 - 145 mmol/L 139  139  140   Potassium 3.5 - 5.1 mmol/L 3.5  3.9  3.0   Chloride 98 - 111 mmol/L 104  103  104   CO2 22 - 32 mmol/L 21  27  24    Calcium 8.9 - 10.3 mg/dL 9.0  8.8  8.7   Total Protein 6.5 - 8.1 g/dL  6.2    Total Bilirubin 0.3 - 1.2 mg/dL  1.1    Alkaline Phos 38 - 126 U/L  61    AST 15 - 41 U/L  24    ALT 0 - 44 U/L  30      Lab Results  Component Value Date   CHOL 201 (H) 04/07/2023   HDL 33 (L) 04/07/2023   LDLCALC 97 04/07/2023   LDLDIRECT 131.0 12/19/2022   TRIG 356 (H) 04/07/2023   CHOLHDL 6.1 04/07/2023   No results for input(s): "LIPOA" in the last 8760 hours. No components found for: "NTPROBNP" No results for input(s): "PROBNP" in the last 8760 hours. Recent Labs     04/06/23 0100  TSH 2.157    Physical Exam:    Today's Vitals   07/07/23 1117  BP: (!) 140/83  Pulse: 73  Resp: 16  SpO2: 98%  Weight: 245 lb (111.1 kg)  Height: 6\' 3"  (1.905 m)   Body mass index is 30.62 kg/m. Wt Readings from Last 3 Encounters:  07/07/23 245 lb (111.1 kg)  06/30/23 245 lb (111.1 kg)  06/23/23 248 lb (112.5 kg)    Physical Exam  Constitutional: No distress.  hemodynamically stable  Neck: No JVD present.  Cardiovascular: Normal rate, regular rhythm, S1 normal and S2 normal. Exam reveals no gallop, no S3 and no S4.  No murmur heard. Pulmonary/Chest: Effort normal and breath sounds normal. No stridor. He has no wheezes. He has no rales.  Abdominal: Soft. Bowel sounds are normal. He exhibits no distension. There is no abdominal tenderness.  Musculoskeletal:        General: No edema.     Cervical back: Neck supple.  Neurological: He is alert and oriented to person, place, and time. He has intact cranial nerves (2-12).  Skin: Skin is warm.     Impression & Recommendation(s):  Impression:   ICD-10-CM   1. Right-sided neurologic pontine infarct (September 2024).  I67.9 EKG 12-Lead    Cardiac Stress Test: Informed  Consent Details: Physician/Practitioner Attestation; Transcribe to consent form and obtain patient signature    2. Paroxysmal atrial fibrillation (HCC)  I48.0 Cardiac Stress Test: Informed Consent Details: Physician/Practitioner Attestation; Transcribe to consent form and obtain patient signature    3. Long term (current) use of anticoagulants  Z79.01     4. Benign hypertension  I10 Cardiac Stress Test: Informed Consent Details: Physician/Practitioner Attestation; Transcribe to consent form and obtain patient signature    5. Mixed hyperlipidemia  E78.2 EXERCISE TOLERANCE TEST (ETT)    CT CARDIAC SCORING (SELF PAY ONLY)    Cardiac Stress Test: Informed Consent Details: Physician/Practitioner Attestation; Transcribe to consent form and obtain  patient signature    6. Sleep apnea in adult  G47.30     7. Alcohol abuse  F10.10     8. Class 1 obesity due to excess calories with serious comorbidity and body mass index (BMI) of 30.0 to 30.9 in adult  E66.811    E66.09    Z68.30        Recommendation(s):  Right-sided neurologic pontine infarct (September 2024). Index event September 2024. MRI of the brain notes of bilateral strokes-see above. Diagnosed with atrial fibrillation back in 2018 and endorses being on anticoagulation.  After this recent stroke Xarelto was changed to Eliquis. Re emphasized importance of following up with neurology. Patient has stopped consumption of alcohol since his stroke. Echocardiogram notes preserved LVEF, interatrial septum was not well-visualized per report. Given bilateral strokes per imaging recommended transesophageal echocardiogram to evaluate for PFO.  Informed Consent   Shared Decision Making/Informed Consent   The risks [esophageal damage, perforation (1:10,000 risk), bleeding, pharyngeal hematoma as well as other potential complications associated with conscious sedation including aspiration, arrhythmia, respiratory failure and death], benefits (treatment guidance and diagnostic support) and alternatives of a transesophageal echocardiogram were discussed in detail with Mr. Forseth and he is willing to proceed.      Paroxysmal atrial fibrillation (HCC) Rate control: Diltiazem. Rhythm control: N/A. Thromboembolic prophylaxis: Eliquis Click Here to Calculate/Change CHADS2VASc Score The patient's CHADS2-VASc score is 4, indicating a 4.8% annual risk of stroke.   CHF History: No HTN History: Yes Diabetes History: Yes Stroke History: Yes Vascular Disease History: No  Long term (current) use of anticoagulants Used to be on Xarelto later transition to after his stroke in September 2024. Does not endorse evidence of bleeding. Risks, benefits, alternatives of anticoagulation discussed with  the patient during today's encounter.  Benign hypertension Office blood pressures are acceptable but not at goal. Continue Farxiga 10 mg p.o. daily. Continue lisinopril 20 mg p.o. daily. Recommend a goal SBP of 130 mmHg. Currently managed by primary care provider. But he is more than welcome to call us back if further medication titration is warranted.  Mixed hyperlipidemia Currently on Crestor 20 mg p.o. nightly.   He denies myalgia or other side effects. Most recent lipids dated September 2024, independently reviewed as noted above.  LDL and triglycerides not at goal Recommended that he have his lipids rechecked since he stopped drinking alcohol and he has been compliant with statin therapy  Sleep apnea in adult Reemphasized importance of using his device therapy.  Alcohol abuse Reemphasized importance of complete alcohol cessation.  Class 1 obesity due to excess calories with serious comorbidity and body mass index (BMI) of 30.0 to 30.9 in adult Body mass index is 30.62 kg/m. I reviewed with him importance of diet, regular physical activity/exercise, weight loss.   Patient is educated on the importance of increasing  physical activity gradually as tolerated with a goal of moderate intensity exercise for 30 minutes a day 5 days a week.  From a cardiovascular standpoint he has multiple cardiovascular risk factors and would benefit from ischemic workup especially given his history of strokes and diabetes.  Shared decision was to proceed forward with a coronary calcium score for further risk stratification.  If his calcium score is reasonable limits then proceeding forward with a GXT as appropriate.  However if he has severe CAC cardiac PET/CT would need to be considered.  I have asked him to have a calcium score done as soon as possible to help guide decision making.  Orders Placed:  Orders Placed This Encounter  Procedures   CT CARDIAC SCORING (SELF PAY ONLY)    Standing Status:    Future    Standing Expiration Date:   07/06/2024    Order Specific Question:   Preferred imaging location?    Answer:   [facility]   Cardiac Stress Test: Informed Consent Details: Physician/Practitioner Attestation; Transcribe to consent form and obtain patient signature    Order Specific Question:   Physician/Practitioner attestation of informed consent for procedure/surgical case    Answer:   I, the physician/practitioner, attest that I have discussed with the patient the benefits, risks, side effects, alternatives, likelihood of achieving goals and potential problems during recovery for the procedure that I have provided informed consent.    Order Specific Question:   Procedure    Answer:   Exercise Tolerance Test    Order Specific Question:   Indication/Reason    Answer:   Benign hypertension   Cardiac Stress Test: Informed Consent Details: Physician/Practitioner Attestation; Transcribe to consent form and obtain patient signature    Order Specific Question:   Physician/Practitioner attestation of informed consent for procedure/surgical case    Answer:   I, the physician/practitioner, attest that I have discussed with the patient the benefits, risks, side effects, alternatives, likelihood of achieving goals and potential problems during recovery for the procedure that I have provided informed consent.    Order Specific Question:   Procedure    Answer:   Transesophageal echocardiogram    Order Specific Question:   Indication/Reason    Answer:   Stroke   EXERCISE TOLERANCE TEST (ETT)    Standing Status:   Future    Standing Expiration Date:   07/06/2024    Order Specific Question:   Where should this test be performed    Answer:   [facility])    Order Specific Question:   Stress with pharmacologic or treadmill ?    Answer:   Treadmill w/ exercise    Order Specific Question:   Is patient able to ambulate on a treadmill?    Answer:   Yes   EKG 12-Lead     As part of medical decision making results of the discharge summary September 2024, MRI of the brain with and without contrast 04/05/2023, EKG, labs dated September 2024, were reviewed independently at today's visit.   Final Medication List:   No orders of the defined types were placed in this encounter.   There are no discontinued medications.   Current Outpatient Medications:    apixaban (ELIQUIS) 5 MG TABS tablet, Take 1 tablet (5 mg total) by mouth 2 (two) times daily., Disp: 60 tablet, Rfl: 11   cetirizine (ZYRTEC) 10 MG tablet, Take 10 mg by mouth daily., Disp: , Rfl:    citalopram (CELEXA) 20 MG tablet,  TAKE 1 TABLET BY MOUTH DAILY, Disp: 90 tablet, Rfl: 3   colchicine 0.6 MG tablet, TAKE 1 TABLET BY MOUTH  TWICE DAILY AS NEEDED, Disp: 180 tablet, Rfl: 1   cyanocobalamin 1000 MCG tablet, Take 1,000 mcg by mouth daily., Disp: , Rfl:    diltiazem (CARDIZEM CD) 120 MG 24 hr capsule, TAKE 1 CAPSULE BY MOUTH DAILY, Disp: 90 capsule, Rfl: 3   doxycycline (VIBRA-TABS) 100 MG tablet, Take 1 tablet (100 mg total) by mouth 2 (two) times daily., Disp: 14 tablet, Rfl: 0   FARXIGA 10 MG TABS tablet, TAKE 1 TABLET BY MOUTH DAILY  BEFORE BREAKFAST, Disp: 90 tablet, Rfl: 3   glucose blood (ONETOUCH VERIO) test strip, Use to check blood sugar 1-2 times daily, Disp: 200 each, Rfl: 11   hyoscyamine (LEVSIN) 0.125 MG tablet, Take 1 tablet (0.125 mg total) by mouth 3 (three) times daily as needed. (Patient taking differently: Take 0.125 mg by mouth daily.), Disp: 60 tablet, Rfl: 1   lisinopril (ZESTRIL) 20 MG tablet, TAKE 1 TABLET BY MOUTH DAILY, Disp: 90 tablet, Rfl: 3   metFORMIN (GLUCOPHAGE-XR) 500 MG 24 hr tablet, TAKE 2 TABLETS BY MOUTH DAILY  WITH BREAKFAST, Disp: 180 tablet, Rfl: 3   Methylcellulose, Laxative, (CITRUCEL) 500 MG TABS, Take 1 tablet by mouth at bedtime., Disp: , Rfl:    Multiple Vitamin (MULTIVITAMIN WITH MINERALS) TABS tablet, Take 1 tablet by mouth daily., Disp: , Rfl:     omeprazole (PRILOSEC) 20 MG capsule, TAKE 1 CAPSULE BY MOUTH DAILY, Disp: 90 capsule, Rfl: 3   OneTouch Delica Lancets 33G MISC, Use to obtain blood sugar sample 1-2 times daily., Disp: 200 each, Rfl: 11   rosuvastatin (CRESTOR) 20 MG tablet, Take 1 tablet (20 mg total) by mouth daily., Disp: 90 tablet, Rfl: 3   valACYclovir (VALTREX) 1000 MG tablet, TAKE 2 TABLETS BY MOUTH TWICE  DAILY FOR 1 DAY FOR COLD SORE, Disp: 20 tablet, Rfl: 3  Consent:   Informed Consent   Shared Decision Making/Informed Consent   The risks [esophageal damage, perforation (1:10,000 risk), bleeding, pharyngeal hematoma as well as other potential complications associated with conscious sedation including aspiration, arrhythmia, respiratory failure and death], benefits (treatment guidance and diagnostic support) and alternatives of a transesophageal echocardiogram were discussed in detail with Mr. Sogge and he is willing to proceed.     Informed Consent   Shared Decision Making/Informed Consent The risks [chest pain, shortness of breath, cardiac arrhythmias, dizziness, blood pressure fluctuations, myocardial infarction, stroke/transient ischemic attack, and life-threatening complications (estimated to be 1 in 10,000)], benefits (risk stratification, diagnosing coronary artery disease, treatment guidance) and alternatives of an exercise tolerance test were discussed in detail with Mr. Corless and he agrees to proceed.     Disposition:   6 months sooner if needed.  Patient may be asked to follow-up sooner based on the results of the above-mentioned testing.  His questions and concerns were addressed to his satisfaction. He voices understanding of the recommendations provided during this encounter.    Signed, Tessa Lerner, DO, [facility]  [facility]  [address] [place], [place] [address] 07/07/2023 12:38 PM

## 2023-07-08 ENCOUNTER — Ambulatory Visit (HOSPITAL_BASED_OUTPATIENT_CLINIC_OR_DEPARTMENT_OTHER)
Admission: RE | Admit: 2023-07-08 | Discharge: 2023-07-08 | Disposition: A | Discharge status: Home / Self Care | Payer: Self-pay | Source: Ambulatory Visit | Attending: Cardiology | Admitting: Cardiology

## 2023-07-08 ENCOUNTER — Other Ambulatory Visit: Payer: Self-pay

## 2023-07-08 DIAGNOSIS — E782 Mixed hyperlipidemia: Secondary | ICD-10-CM | POA: Insufficient documentation

## 2023-07-08 DIAGNOSIS — I480 Paroxysmal atrial fibrillation: Secondary | ICD-10-CM

## 2023-07-09 LAB — BASIC METABOLIC PANEL
BUN/Creatinine Ratio: 14 (ref 9–20)
BUN: 20 mg/dL (ref 6–24)
CO2: 22 mmol/L (ref 20–29)
Calcium: 9.8 mg/dL (ref 8.7–10.2)
Chloride: 102 mmol/L (ref 96–106)
Creatinine, Ser: 1.43 mg/dL — ABNORMAL HIGH (ref 0.76–1.27)
Glucose: 221 mg/dL — ABNORMAL HIGH (ref 70–99)
Potassium: 4.3 mmol/L (ref 3.5–5.2)
Sodium: 141 mmol/L (ref 134–144)
eGFR: 56 mL/min/{1.73_m2} — ABNORMAL LOW (ref >59)

## 2023-07-09 LAB — CBC
Hematocrit: 44.7 % (ref 37.5–51.0)
Hemoglobin: 14.6 g/dL (ref 13.0–17.7)
MCH: 28.9 pg (ref 26.6–33.0)
MCHC: 32.7 g/dL (ref 31.5–35.7)
MCV: 89 fL (ref 79–97)
Platelets: 240 10*3/uL (ref 150–450)
RBC: 5.05 x10E6/uL (ref 4.14–5.80)
RDW: 12.8 % (ref 11.6–15.4)
WBC: 10.1 10*3/uL (ref 3.4–10.8)

## 2023-07-11 NOTE — Progress Notes (Addendum)
Unable to reach patient about procedure, but was able to leave a detailed message. Stated that the patient needed to arrive at the hospital at 0915 , remain NPO after 0000, needs to have a ride home and a responsible adult to stay with them for 24 hours after the procedure. Instructed the patient to call back if they had any questions.

## 2023-07-14 ENCOUNTER — Ambulatory Visit (HOSPITAL_COMMUNITY): Payer: BC Managed Care – PPO | Admitting: Anesthesiology

## 2023-07-14 ENCOUNTER — Encounter (HOSPITAL_COMMUNITY): Payer: Self-pay | Admitting: Cardiology

## 2023-07-14 ENCOUNTER — Ambulatory Visit (HOSPITAL_BASED_OUTPATIENT_CLINIC_OR_DEPARTMENT_OTHER)
Admission: RE | Admit: 2023-07-14 | Discharge: 2023-07-14 | Disposition: A | Discharge status: Home / Self Care | Payer: BC Managed Care – PPO | Source: Ambulatory Visit | Attending: Cardiology | Admitting: Cardiology

## 2023-07-14 ENCOUNTER — Other Ambulatory Visit: Payer: Self-pay

## 2023-07-14 ENCOUNTER — Ambulatory Visit (HOSPITAL_COMMUNITY)
Admission: RE | Admit: 2023-07-14 | Discharge: 2023-07-14 | Disposition: A | Discharge status: Home / Self Care | Payer: BC Managed Care – PPO | Attending: Cardiology | Admitting: Cardiology

## 2023-07-14 ENCOUNTER — Encounter (HOSPITAL_COMMUNITY)
Admission: RE | Disposition: A | Discharge status: Home / Self Care | Payer: Self-pay | Source: Home / Self Care | Attending: Cardiology

## 2023-07-14 DIAGNOSIS — Z8673 Personal history of transient ischemic attack (TIA), and cerebral infarction without residual deficits: Secondary | ICD-10-CM | POA: Diagnosis not present

## 2023-07-14 DIAGNOSIS — Z7901 Long term (current) use of anticoagulants: Secondary | ICD-10-CM | POA: Diagnosis not present

## 2023-07-14 DIAGNOSIS — I480 Paroxysmal atrial fibrillation: Secondary | ICD-10-CM | POA: Insufficient documentation

## 2023-07-14 DIAGNOSIS — F1010 Alcohol abuse, uncomplicated: Secondary | ICD-10-CM | POA: Insufficient documentation

## 2023-07-14 DIAGNOSIS — Z79899 Other long term (current) drug therapy: Secondary | ICD-10-CM | POA: Diagnosis not present

## 2023-07-14 DIAGNOSIS — I679 Cerebrovascular disease, unspecified: Secondary | ICD-10-CM | POA: Diagnosis not present

## 2023-07-14 DIAGNOSIS — Z7984 Long term (current) use of oral hypoglycemic drugs: Secondary | ICD-10-CM | POA: Diagnosis not present

## 2023-07-14 DIAGNOSIS — E66811 Obesity, class 1: Secondary | ICD-10-CM | POA: Insufficient documentation

## 2023-07-14 DIAGNOSIS — E119 Type 2 diabetes mellitus without complications: Secondary | ICD-10-CM | POA: Diagnosis not present

## 2023-07-14 DIAGNOSIS — I340 Nonrheumatic mitral (valve) insufficiency: Secondary | ICD-10-CM

## 2023-07-14 DIAGNOSIS — Z6830 Body mass index (BMI) 30.0-30.9, adult: Secondary | ICD-10-CM | POA: Diagnosis not present

## 2023-07-14 DIAGNOSIS — I10 Essential (primary) hypertension: Secondary | ICD-10-CM | POA: Diagnosis not present

## 2023-07-14 DIAGNOSIS — I700 Atherosclerosis of aorta: Secondary | ICD-10-CM | POA: Insufficient documentation

## 2023-07-14 DIAGNOSIS — I639 Cerebral infarction, unspecified: Secondary | ICD-10-CM | POA: Diagnosis not present

## 2023-07-14 DIAGNOSIS — I253 Aneurysm of heart: Secondary | ICD-10-CM | POA: Insufficient documentation

## 2023-07-14 DIAGNOSIS — E782 Mixed hyperlipidemia: Secondary | ICD-10-CM | POA: Diagnosis not present

## 2023-07-14 DIAGNOSIS — I080 Rheumatic disorders of both mitral and aortic valves: Secondary | ICD-10-CM | POA: Insufficient documentation

## 2023-07-14 DIAGNOSIS — Q2112 Patent foramen ovale: Secondary | ICD-10-CM

## 2023-07-14 DIAGNOSIS — G4730 Sleep apnea, unspecified: Secondary | ICD-10-CM | POA: Insufficient documentation

## 2023-07-14 HISTORY — PX: TRANSESOPHAGEAL ECHOCARDIOGRAM (CATH LAB): EP1270

## 2023-07-14 LAB — ECHO TEE
AV Mean grad: 4 mm[Hg]
AV Peak grad: 5.7 mm[Hg]
Ao pk vel: 1.19 m/s

## 2023-07-14 LAB — GLUCOSE, CAPILLARY: Glucose-Capillary: 230 mg/dL — ABNORMAL HIGH (ref 70–99)

## 2023-07-14 SURGERY — TRANSESOPHAGEAL ECHOCARDIOGRAM (TEE) (CATHLAB)
Anesthesia: Monitor Anesthesia Care

## 2023-07-14 MED ORDER — SODIUM CHLORIDE 0.9 % IV SOLN
INTRAVENOUS | Status: DC
Start: 2023-07-14 — End: 2023-07-14
  Administered 2023-07-14: 20 mL/h via INTRAVENOUS

## 2023-07-14 MED ORDER — PROPOFOL 10 MG/ML IV BOLUS
INTRAVENOUS | Status: DC | PRN
  Administered 2023-07-14: 100 mg via INTRAVENOUS
  Administered 2023-07-14 (×2): 40 mg via INTRAVENOUS
  Administered 2023-07-14: 20 mg via INTRAVENOUS
  Administered 2023-07-14 (×2): 40 mg via INTRAVENOUS

## 2023-07-14 MED ORDER — SODIUM CHLORIDE 0.9 % IV SOLN: INTRAVENOUS | Status: DC | PRN

## 2023-07-14 NOTE — H&P (Signed)
Office Visit 07/07/2023 [facility] at [facility]   [place], [place], [place] [facility] Right-sided neurologic pontine infarct (September 2024). +7 more Dx Cerebrovascular disease   New Patient (Initial Visit); Referred by Charles Schwalbe, MD Reason for Visit   Additional Documentation  Vitals: BP 140/83 Important   (BP Location: Left Arm, Patient Position: Sitting, Cuff Size: Large)   Pulse 73   Resp 16   Ht 6\' 3"  (1.905 m)   Wt 111.1 kg   SpO2 98%   BMI 30.62 kg/m   BSA 2.42 m  Flowsheets: Vital Signs,   NEWS,   MEWS Score,   Vital Signs,   Anthropometrics,   Data,   CHADS2-VASc Score  Encounter Info: Billing Info,   History,   Allergies,   Detailed Report   All Notes   Progress Notes by Tessa Lerner, DO at 07/07/2023 10:45 AM  Author: Tessa Lerner, DO Author Type: Physician Filed: 07/07/2023 12:50 PM  Note Status: Signed Cosign: Cosign Not Required Encounter Date: 07/07/2023  Editor: Tessa Lerner, DO (Physician)             Expand All Collapse All     Cardiology Office Note:     Date:  07/07/2023  NAME:  Charles Phelps                            MRN:   [id number] DOB:  [birth year]              PCP:  Charles Schwalbe, MD   Former Cardiology Providers: N/A Primary Cardiologist:  Tessa Lerner, DO, [facility] (established care 07/07/2023) Electrophysiologist:  None    Referring MD: Charles Schwalbe, MD  Reason of Consult: Recent stroke      Chief Complaint  Patient presents with   Cerebrovascular disease    New Patient (Initial Visit)      History of Present Illness:     Charles Phelps is a 57 y.o. Caucasian male whose past medical history and cardiovascular risk factors includes: Hypertension, diabetes, OSA on CPAP, atrial fibrillation (2018), Etoh abuse. He is being seen today for the evaluation of a recent stroke at the request of Charles Schwalbe, MD.   Patient presents to the office to establish.  Given his history of atrial  fibrillation and recent stroke.   Patient presented to the hospital in September 2024 with ataxia for 5 days prior.  He presented to the ED and was noted to have acute/subacute nonhemorrhagic right pontine infarct.  He was noted to have basilar artery stenosis and was seen by interventional radiology.  On April 09, 2023 he underwent cerebral angiogram which noted mild stenosis of the proximal basilar artery, significantly improved from prior CT angiogram.  Therefore no angioplasty or stenting warranted.  Xarelto was changed to Eliquis.   Patient was later discharged home and referred to cardiology to establish care given his comorbidities.   Clinically he denies anginal chest pain or heart failure symptoms.  No new focal neurological deficits and overall improving well.   Current Medications: Active Medications      Current Meds  Medication Sig   apixaban (ELIQUIS) 5 MG TABS tablet Take 1 tablet (5 mg total) by mouth 2 (two) times daily.   cetirizine (ZYRTEC) 10 MG tablet Take 10 mg by mouth daily.   citalopram (CELEXA) 20 MG tablet TAKE 1 TABLET BY MOUTH DAILY   colchicine 0.6 MG tablet TAKE 1  TABLET BY MOUTH  TWICE DAILY AS NEEDED   cyanocobalamin 1000 MCG tablet Take 1,000 mcg by mouth daily.   diltiazem (CARDIZEM CD) 120 MG 24 hr capsule TAKE 1 CAPSULE BY MOUTH DAILY   doxycycline (VIBRA-TABS) 100 MG tablet Take 1 tablet (100 mg total) by mouth 2 (two) times daily.   FARXIGA 10 MG TABS tablet TAKE 1 TABLET BY MOUTH DAILY  BEFORE BREAKFAST   glucose blood (ONETOUCH VERIO) test strip Use to check blood sugar 1-2 times daily   hyoscyamine (LEVSIN) 0.125 MG tablet Take 1 tablet (0.125 mg total) by mouth 3 (three) times daily as needed. (Patient taking differently: Take 0.125 mg by mouth daily.)   lisinopril (ZESTRIL) 20 MG tablet TAKE 1 TABLET BY MOUTH DAILY   metFORMIN (GLUCOPHAGE-XR) 500 MG 24 hr tablet TAKE 2 TABLETS BY MOUTH DAILY  WITH BREAKFAST   Methylcellulose, Laxative,  (CITRUCEL) 500 MG TABS Take 1 tablet by mouth at bedtime.   Multiple Vitamin (MULTIVITAMIN WITH MINERALS) TABS tablet Take 1 tablet by mouth daily.   omeprazole (PRILOSEC) 20 MG capsule TAKE 1 CAPSULE BY MOUTH DAILY   OneTouch Delica Lancets 33G MISC Use to obtain blood sugar sample 1-2 times daily.   rosuvastatin (CRESTOR) 20 MG tablet Take 1 tablet (20 mg total) by mouth daily.   valACYclovir (VALTREX) 1000 MG tablet TAKE 2 TABLETS BY MOUTH TWICE  DAILY FOR 1 DAY FOR COLD SORE        Allergies:    Atorvastatin    Past Medical History:     Past Medical History:  Diagnosis Date   Allergic rhinitis     Anxiety     Arthritis     Atrial fibrillation (HCC)     Diabetes (HCC)     Elevated LFTs 2000    fatty liver   GERD (gastroesophageal reflux disease)     Glucose intolerance (impaired glucose tolerance)     Gout     Hyperlipidemia     Hypertension     IBS (irritable bowel syndrome)     Obstructive sleep apnea     Osteoarthritis            Past Surgical History:      Past Surgical History:  Procedure Laterality Date   IR ANGIO INTRA EXTRACRAN SEL INTERNAL CAROTID BILAT MOD SED   04/09/2023   IR ANGIO VERTEBRAL SEL VERTEBRAL BILAT MOD SED   04/09/2023   IR ENDOVASC INTRACRANIAL INF OTHER THAN THROMBO ART INC DIAG ANGIO EA ADD   04/09/2023   IR US GUIDE VASC ACCESS RIGHT   04/09/2023   NO PAST SURGERIES       RADIOLOGY WITH ANESTHESIA N/A 04/09/2023    Procedure: IR WITH ANESTHESIA;  Surgeon: Radiologist, Medication, MD;  Location: [facility] OR;  Service: Radiology;  Laterality: N/A;          Social History: Social History  Social History         Tobacco Use   Smoking status: Never   Smokeless tobacco: Never  Vaping Use   Vaping status: Never Used  Substance Use Topics   Alcohol use: Yes      Comment: Occasionally   Drug use: No        Family History:      Family History  Problem Relation Age of Onset   Hypertension Mother     Diabetes Mother     Heart disease  Father          CABG at  53   Cancer Father          lung   Colon polyps Father     Diabetes Sister     Diabetes Brother     Heart failure Brother     Colon cancer Neg Hx     Stomach cancer Neg Hx     Esophageal cancer Neg Hx            ROS:   Review of Systems  Cardiovascular:  Negative for chest pain, claudication, irregular heartbeat, leg swelling, near-syncope, orthopnea, palpitations, paroxysmal nocturnal dyspnea and syncope.  Respiratory:  Negative for shortness of breath.   Hematologic/Lymphatic: Negative for bleeding problem.      EKGs/Labs/Other Studies Reviewed:   EKG: EKG Interpretation Date/Time:                  Monday July 07 2023 11:20:30 EST Ventricular Rate:         72 PR Interval:                 166 QRS Duration:             96 QT Interval:                 406 QTC Calculation:        444 R Axis:                         6   Text Interpretation:      Normal sinus rhythm Nonspecific T wave abnormality When compared with ECG of 05-Apr-2023 12:20, No significant change was found Confirmed by Tessa Lerner [phone]) on 07/07/2023 11:27:18 AM   Echocardiogram: September 2024: LVEF 60-65%.  Grade 1 diastolic dysfunction.  Right ventricular size and function normal. No significant valvular heart disease. Estimated RAP 3 mmHg. See report for additional details   RADIOLOY: MR Brain w/ and w/o contrast 03/2023 1. Acute/subacute nonhemorrhagic right pontine infarct. 2. Remote left paramedian pontine infarct. 3. Remote nonhemorrhagic infarct of the right lentiform nucleus. 4. Remote lacunar infarct of the lateral left cerebellum.   Labs:     Latest Ref Rng & Units 04/09/2023    6:33 AM 04/08/2023    6:31 AM 04/06/2023    1:00 AM  CBC  WBC 4.0 - 10.5 K/uL 6.0  5.7  7.1   Hemoglobin 13.0 - 17.0 g/dL 21.3  08.6  57.8   Hematocrit 39.0 - 52.0 % 43.8  40.9  40.6   Platelets 150 - 400 K/uL 153  139  155           Latest Ref Rng & Units 04/09/2023    6:33 AM  04/07/2023    8:16 AM 04/06/2023    1:00 AM  BMP  Glucose 70 - 99 mg/dL 469  629  528   BUN 6 - 20 mg/dL 10  15  13    Creatinine 0.61 - 1.24 mg/dL 4.13  2.44  0.10   Sodium 135 - 145 mmol/L 139  139  140   Potassium 3.5 - 5.1 mmol/L 3.5  3.9  3.0   Chloride 98 - 111 mmol/L 104  103  104   CO2 22 - 32 mmol/L 21  27  24    Calcium 8.9 - 10.3 mg/dL 9.0  8.8  8.7         Latest Ref Rng & Units 04/09/2023    6:33 AM 04/07/2023    8:16 AM 04/06/2023  1:00 AM  CMP  Glucose 70 - 99 mg/dL 161  096  045   BUN 6 - 20 mg/dL 10  15  13    Creatinine 0.61 - 1.24 mg/dL 4.09  8.11  9.14   Sodium 135 - 145 mmol/L 139  139  140   Potassium 3.5 - 5.1 mmol/L 3.5  3.9  3.0   Chloride 98 - 111 mmol/L 104  103  104   CO2 22 - 32 mmol/L 21  27  24    Calcium 8.9 - 10.3 mg/dL 9.0  8.8  8.7   Total Protein 6.5 - 8.1 g/dL   6.2     Total Bilirubin 0.3 - 1.2 mg/dL   1.1     Alkaline Phos 38 - 126 U/L   61     AST 15 - 41 U/L   24     ALT 0 - 44 U/L   30         Recent Labs       Lab Results  Component Value Date    CHOL 201 (H) 04/07/2023    HDL 33 (L) 04/07/2023    LDLCALC 97 04/07/2023    LDLDIRECT 131.0 12/19/2022    TRIG 356 (H) 04/07/2023    CHOLHDL 6.1 04/07/2023      Recent Labs (within last 365 days)  No results for input(s): "LIPOA" in the last 8760 hours.   Recent Labs  No components found for: "NTPROBNP"   Recent Labs (within last 365 days)  No results for input(s): "PROBNP" in the last 8760 hours.   Recent Labs (within last 365 days)     Recent Labs    04/06/23 0100  TSH 2.157        Physical Exam:        Today's Vitals    07/07/23 1117  BP: (!) 140/83  Pulse: 73  Resp: 16  SpO2: 98%  Weight: 245 lb (111.1 kg)  Height: 6\' 3"  (1.905 m)    Body mass index is 30.62 kg/m.    Wt Readings from Last 3 Encounters:  07/07/23 245 lb (111.1 kg)  06/30/23 245 lb (111.1 kg)  06/23/23 248 lb (112.5 kg)    Physical Exam  Constitutional: No distress.  hemodynamically  stable  Neck: No JVD present.  Cardiovascular: Normal rate, regular rhythm, S1 normal and S2 normal. Exam reveals no gallop, no S3 and no S4.  No murmur heard. Pulmonary/Chest: Effort normal and breath sounds normal. No stridor. He has no wheezes. He has no rales.  Abdominal: Soft. Bowel sounds are normal. He exhibits no distension. There is no abdominal tenderness.  Musculoskeletal:        General: No edema.     Cervical back: Neck supple.  Neurological: He is alert and oriented to person, place, and time. He has intact cranial nerves (2-12).  Skin: Skin is warm.        Impression & Recommendation(s):  Impression:     ICD-10-CM    1. Right-sided neurologic pontine infarct (September 2024).  I67.9 EKG 12-Lead      Cardiac Stress Test: Informed Consent Details: Physician/Practitioner Attestation; Transcribe to consent form and obtain patient signature     2. Paroxysmal atrial fibrillation (HCC)  I48.0 Cardiac Stress Test: Informed Consent Details: Physician/Practitioner Attestation; Transcribe to consent form and obtain patient signature     3. Long term (current) use of anticoagulants  Z79.01       4. Benign hypertension  I10 Cardiac  Stress Test: Informed Consent Details: Physician/Practitioner Attestation; Transcribe to consent form and obtain patient signature     5. Mixed hyperlipidemia  E78.2 EXERCISE TOLERANCE TEST (ETT)      CT CARDIAC SCORING (SELF PAY ONLY)      Cardiac Stress Test: Informed Consent Details: Physician/Practitioner Attestation; Transcribe to consent form and obtain patient signature     6. Sleep apnea in adult  G47.30       7. Alcohol abuse  F10.10       8. Class 1 obesity due to excess calories with serious comorbidity and body mass index (BMI) of 30.0 to 30.9 in adult  E66.811      E66.09      Z68.30           Recommendation(s):  Right-sided neurologic pontine infarct (September 2024). Index event September 2024. MRI of the brain notes of bilateral  strokes-see above. Diagnosed with atrial fibrillation back in 2018 and endorses being on anticoagulation.  After this recent stroke Xarelto was changed to Eliquis. Re emphasized importance of following up with neurology. Patient has stopped consumption of alcohol since his stroke. Echocardiogram notes preserved LVEF, interatrial septum was not well-visualized per report. Given bilateral strokes per imaging recommended transesophageal echocardiogram to evaluate for PFO.   Informed Consent Shared Decision Making/Informed Consent   The risks [esophageal damage, perforation (1:10,000 risk), bleeding, pharyngeal hematoma as well as other potential complications associated with conscious sedation including aspiration, arrhythmia, respiratory failure and death], benefits (treatment guidance and diagnostic support) and alternatives of a transesophageal echocardiogram were discussed in detail with Mr. Look and he is willing to proceed.        Paroxysmal atrial fibrillation (HCC) Rate control: Diltiazem. Rhythm control: N/A. Thromboembolic prophylaxis: Eliquis Click Here to Calculate/Change CHADS2VASc Score The patient's CHADS2-VASc score is 4, indicating a 4.8% annual risk of stroke.   CHF History: No HTN History: Yes Diabetes History: Yes Stroke History: Yes Vascular Disease History: No   Long term (current) use of anticoagulants Used to be on Xarelto later transition to after his stroke in September 2024. Does not endorse evidence of bleeding. Risks, benefits, alternatives of anticoagulation discussed with the patient during today's encounter.   Benign hypertension Office blood pressures are acceptable but not at goal. Continue Farxiga 10 mg p.o. daily. Continue lisinopril 20 mg p.o. daily. Recommend a goal SBP of 130 mmHg. Currently managed by primary care provider. But he is more than welcome to call us back if further medication titration is warranted.   Mixed  hyperlipidemia Currently on Crestor 20 mg p.o. nightly.   He denies myalgia or other side effects. Most recent lipids dated September 2024, independently reviewed as noted above.  LDL and triglycerides not at goal Recommended that he have his lipids rechecked since he stopped drinking alcohol and he has been compliant with statin therapy   Sleep apnea in adult Reemphasized importance of using his device therapy.   Alcohol abuse Reemphasized importance of complete alcohol cessation.   Class 1 obesity due to excess calories with serious comorbidity and body mass index (BMI) of 30.0 to 30.9 in adult Body mass index is 30.62 kg/m. I reviewed with him importance of diet, regular physical activity/exercise, weight loss.   Patient is educated on the importance of increasing physical activity gradually as tolerated with a goal of moderate intensity exercise for 30 minutes a day 5 days a week.   From a cardiovascular standpoint he has multiple cardiovascular risk factors and would benefit  from ischemic workup especially given his history of strokes and diabetes.  Shared decision was to proceed forward with a coronary calcium score for further risk stratification.  If his calcium score is reasonable limits then proceeding forward with a GXT as appropriate.  However if he has severe CAC cardiac PET/CT would need to be considered.  I have asked him to have a calcium score done as soon as possible to help guide decision making.   Orders Placed:       Orders Placed This Encounter  Procedures   CT CARDIAC SCORING (SELF PAY ONLY)      Standing Status:   Future      Standing Expiration Date:   07/06/2024      Order Specific Question:   Preferred imaging location?      Answer:   [facility]   Cardiac Stress Test: Informed Consent Details: Physician/Practitioner Attestation; Transcribe to consent form and obtain patient signature      Order Specific Question:   Physician/Practitioner attestation  of informed consent for procedure/surgical case      Answer:   I, the physician/practitioner, attest that I have discussed with the patient the benefits, risks, side effects, alternatives, likelihood of achieving goals and potential problems during recovery for the procedure that I have provided informed consent.      Order Specific Question:   Procedure      Answer:   Exercise Tolerance Test      Order Specific Question:   Indication/Reason      Answer:   Benign hypertension   Cardiac Stress Test: Informed Consent Details: Physician/Practitioner Attestation; Transcribe to consent form and obtain patient signature      Order Specific Question:   Physician/Practitioner attestation of informed consent for procedure/surgical case      Answer:   I, the physician/practitioner, attest that I have discussed with the patient the benefits, risks, side effects, alternatives, likelihood of achieving goals and potential problems during recovery for the procedure that I have provided informed consent.      Order Specific Question:   Procedure      Answer:   Transesophageal echocardiogram      Order Specific Question:   Indication/Reason      Answer:   Stroke   EXERCISE TOLERANCE TEST (ETT)      Standing Status:   Future      Standing Expiration Date:   07/06/2024      Order Specific Question:   Where should this test be performed      Answer:   [facility])      Order Specific Question:   Stress with pharmacologic or treadmill ?      Answer:   Treadmill w/ exercise      Order Specific Question:   Is patient able to ambulate on a treadmill?      Answer:   Yes   EKG 12-Lead      As part of medical decision making results of the discharge summary September 2024, MRI of the brain with and without contrast 04/05/2023, EKG, labs dated September 2024, were reviewed independently at today's visit.    Final Medication List:   No orders of the defined types were placed in this encounter.    There are no discontinued medications.   Current Medications    Current Outpatient Medications:    apixaban (ELIQUIS) 5 MG TABS tablet, Take 1 tablet (5 mg total) by mouth 2 (two) times daily., Disp: 60  tablet, Rfl: 11   cetirizine (ZYRTEC) 10 MG tablet, Take 10 mg by mouth daily., Disp: , Rfl:    citalopram (CELEXA) 20 MG tablet, TAKE 1 TABLET BY MOUTH DAILY, Disp: 90 tablet, Rfl: 3   colchicine 0.6 MG tablet, TAKE 1 TABLET BY MOUTH  TWICE DAILY AS NEEDED, Disp: 180 tablet, Rfl: 1   cyanocobalamin 1000 MCG tablet, Take 1,000 mcg by mouth daily., Disp: , Rfl:    diltiazem (CARDIZEM CD) 120 MG 24 hr capsule, TAKE 1 CAPSULE BY MOUTH DAILY, Disp: 90 capsule, Rfl: 3   doxycycline (VIBRA-TABS) 100 MG tablet, Take 1 tablet (100 mg total) by mouth 2 (two) times daily., Disp: 14 tablet, Rfl: 0   FARXIGA 10 MG TABS tablet, TAKE 1 TABLET BY MOUTH DAILY  BEFORE BREAKFAST, Disp: 90 tablet, Rfl: 3   glucose blood (ONETOUCH VERIO) test strip, Use to check blood sugar 1-2 times daily, Disp: 200 each, Rfl: 11   hyoscyamine (LEVSIN) 0.125 MG tablet, Take 1 tablet (0.125 mg total) by mouth 3 (three) times daily as needed. (Patient taking differently: Take 0.125 mg by mouth daily.), Disp: 60 tablet, Rfl: 1   lisinopril (ZESTRIL) 20 MG tablet, TAKE 1 TABLET BY MOUTH DAILY, Disp: 90 tablet, Rfl: 3   metFORMIN (GLUCOPHAGE-XR) 500 MG 24 hr tablet, TAKE 2 TABLETS BY MOUTH DAILY  WITH BREAKFAST, Disp: 180 tablet, Rfl: 3   Methylcellulose, Laxative, (CITRUCEL) 500 MG TABS, Take 1 tablet by mouth at bedtime., Disp: , Rfl:    Multiple Vitamin (MULTIVITAMIN WITH MINERALS) TABS tablet, Take 1 tablet by mouth daily., Disp: , Rfl:    omeprazole (PRILOSEC) 20 MG capsule, TAKE 1 CAPSULE BY MOUTH DAILY, Disp: 90 capsule, Rfl: 3   OneTouch Delica Lancets 33G MISC, Use to obtain blood sugar sample 1-2 times daily., Disp: 200 each, Rfl: 11   rosuvastatin (CRESTOR) 20 MG tablet, Take 1 tablet (20 mg total) by mouth daily., Disp: 90  tablet, Rfl: 3   valACYclovir (VALTREX) 1000 MG tablet, TAKE 2 TABLETS BY MOUTH TWICE  DAILY FOR 1 DAY FOR COLD SORE, Disp: 20 tablet, Rfl: 3     Consent:     Informed Consent Shared Decision Making/Informed Consent   The risks [esophageal damage, perforation (1:10,000 risk), bleeding, pharyngeal hematoma as well as other potential complications associated with conscious sedation including aspiration, arrhythmia, respiratory failure and death], benefits (treatment guidance and diagnostic support) and alternatives of a transesophageal echocardiogram were discussed in detail with Mr. Dam and he is willing to proceed.      Informed Consent Shared Decision Making/Informed Consent The risks [chest pain, shortness of breath, cardiac arrhythmias, dizziness, blood pressure fluctuations, myocardial infarction, stroke/transient ischemic attack, and life-threatening complications (estimated to be 1 in 10,000)], benefits (risk stratification, diagnosing coronary artery disease, treatment guidance) and alternatives of an exercise tolerance test were discussed in detail with Mr. Perlow and he agrees to proceed.       Disposition:   6 months sooner if needed.  Patient may be asked to follow-up sooner based on the results of the above-mentioned testing.   His questions and concerns were addressed to his satisfaction. He voices understanding of the recommendations provided during this encounter.      Signed, Tessa Lerner, DO, [facility]  [facility]  [address] [place], [place] [address] 07/07/2023 12:38 PM       For TEE; no changes. Olga Millers

## 2023-07-14 NOTE — Transfer of Care (Signed)
Immediate Anesthesia Transfer of Care Note  Patient: Charles Phelps  Procedure(s) Performed: TRANSESOPHAGEAL ECHOCARDIOGRAM  Patient Location: Cath Lab  Anesthesia Type:MAC  Level of Consciousness: awake, sedated, and drowsy  Airway & Oxygen Therapy: Patient Spontanous Breathing and Patient connected to nasal cannula oxygen  Post-op Assessment: Report given to RN and Post -op Vital signs reviewed and stable  Post vital signs: Reviewed and stable  Last Vitals:  Vitals Value Taken Time  BP 132/68   Temp 97.5   Pulse 78   Resp 13   SpO2 97 %     Last Pain: There were no vitals filed for this visit.       Complications: No notable events documented.

## 2023-07-14 NOTE — Interval H&P Note (Signed)
History and Physical Interval Note:  07/14/2023 9:53 AM  Charles Phelps  has presented today for surgery, with the diagnosis of STROKE.  The various methods of treatment have been discussed with the patient and family. After consideration of risks, benefits and other options for treatment, the patient has consented to  Procedure(s): TRANSESOPHAGEAL ECHOCARDIOGRAM (N/A) as a surgical intervention.  The patient's history has been reviewed, patient examined, no change in status, stable for surgery.  I have reviewed the patient's chart and labs.  Questions were answered to the patient's satisfaction.     Olga Millers

## 2023-07-14 NOTE — Anesthesia Postprocedure Evaluation (Signed)
Anesthesia Post Note  Patient: Charles Phelps  Procedure(s) Performed: TRANSESOPHAGEAL ECHOCARDIOGRAM     Patient location during evaluation: Cath Lab Anesthesia Type: MAC Level of consciousness: awake Pain management: pain level controlled Vital Signs Assessment: post-procedure vital signs reviewed and stable Respiratory status: spontaneous breathing, nonlabored ventilation and respiratory function stable Cardiovascular status: blood pressure returned to baseline and stable Postop Assessment: no apparent nausea or vomiting Anesthetic complications: no   No notable events documented.  Last Vitals:  Vitals:   07/14/23 1100 07/14/23 1105  BP: 134/80 (!) 147/83  Pulse: 64 60  Resp: 18 11  Temp:    SpO2: 100% 100%    Last Pain:  Vitals:   07/14/23 1036  TempSrc: Temporal                 Roneisha Stern P Maryama Kuriakose

## 2023-07-14 NOTE — Progress Notes (Signed)
     Transesophageal Echocardiogram Note  Charles Phelps [id number] 10/22/1965  Procedure: Transesophageal Echocardiogram Indications: CVA  Procedure Details Consent: Obtained Time Out: Verified patient identification, verified procedure, site/side was marked, verified correct patient position, special equipment/implants available, Radiology Safety Procedures followed,  medications/allergies/relevent history reviewed, required imaging and test results available.  Performed  Medications:  Pt sedated by anesthesia with diprovan 280 mg IV total.  Normal LV function; mild MR; atrial septal aneurysm; +PFO; positive saline microcavitation study with abdominal pressure.   Complications: No apparent complications Patient did tolerate procedure well.  Olga Millers, MD

## 2023-07-14 NOTE — Anesthesia Preprocedure Evaluation (Addendum)
Anesthesia Evaluation  Patient identified by MRN, date of birth, ID band Patient awake    Reviewed: Allergy & Precautions, NPO status , Patient's Chart, lab work & pertinent test results  Airway Mallampati: II  TM Distance: >3 FB Neck ROM: Full    Dental no notable dental hx.    Pulmonary sleep apnea and Continuous Positive Airway Pressure Ventilation    Pulmonary exam normal        Cardiovascular hypertension, Pt. on medications Normal cardiovascular exam+ dysrhythmias Atrial Fibrillation      Neuro/Psych  PSYCHIATRIC DISORDERS Anxiety     CVA    GI/Hepatic negative GI ROS, Neg liver ROS,,,  Endo/Other  diabetes, Oral Hypoglycemic Agents    Renal/GU negative Renal ROS     Musculoskeletal  (+) Arthritis ,    Abdominal  (+) + obese  Peds  Hematology negative hematology ROS (+)   Anesthesia Other Findings STROKE  Reproductive/Obstetrics                             Anesthesia Physical Anesthesia Plan  ASA: 3  Anesthesia Plan: MAC   Post-op Pain Management:    Induction:   PONV Risk Score and Plan: Propofol infusion and Treatment may vary due to age or medical condition  Airway Management Planned: Nasal Cannula  Additional Equipment:   Intra-op Plan:   Post-operative Plan:   Informed Consent: I have reviewed the patients History and Physical, chart, labs and discussed the procedure including the risks, benefits and alternatives for the proposed anesthesia with the patient or authorized representative who has indicated his/her understanding and acceptance.     Dental advisory given  Plan Discussed with: CRNA  Anesthesia Plan Comments:        Anesthesia Quick Evaluation

## 2023-07-15 ENCOUNTER — Encounter (HOSPITAL_COMMUNITY): Payer: Self-pay | Admitting: Cardiology

## 2023-07-16 DIAGNOSIS — G4733 Obstructive sleep apnea (adult) (pediatric): Secondary | ICD-10-CM | POA: Diagnosis not present

## 2023-07-21 ENCOUNTER — Other Ambulatory Visit: Payer: Self-pay

## 2023-07-21 ENCOUNTER — Telehealth: Payer: Self-pay

## 2023-07-21 DIAGNOSIS — R918 Other nonspecific abnormal finding of lung field: Secondary | ICD-10-CM

## 2023-07-21 NOTE — Telephone Encounter (Signed)
Left message on verified vm with Dr Karle Starch note.

## 2023-07-21 NOTE — Telephone Encounter (Signed)
-----   Message from Tillman Abide sent at 07/21/2023 12:44 PM EST ----- Please call him Let him know the cardiologist sent me the results and I agree with the plan tor a stress test and a repeat chest CT in 3 months ----- Message ----- From: Cherylann Banas, RN Sent: 07/21/2023   9:28 AM EST To: [name], MD  ATTN: Tillman Abide, MD - pt requested a call from your office after reviewing results

## 2023-07-21 NOTE — Progress Notes (Signed)
Order for chest CT w/o contrast placed per Dr. Emelda Brothers request. CT Cardiac Scoring report routed to pt's PCP. Pt is aware.

## 2023-07-22 ENCOUNTER — Other Ambulatory Visit: Payer: Self-pay | Admitting: Cardiology

## 2023-07-22 DIAGNOSIS — G4730 Sleep apnea, unspecified: Secondary | ICD-10-CM

## 2023-07-22 DIAGNOSIS — I679 Cerebrovascular disease, unspecified: Secondary | ICD-10-CM

## 2023-07-22 DIAGNOSIS — E782 Mixed hyperlipidemia: Secondary | ICD-10-CM

## 2023-07-22 DIAGNOSIS — F1010 Alcohol abuse, uncomplicated: Secondary | ICD-10-CM

## 2023-07-22 DIAGNOSIS — I480 Paroxysmal atrial fibrillation: Secondary | ICD-10-CM

## 2023-07-22 DIAGNOSIS — E6609 Other obesity due to excess calories: Secondary | ICD-10-CM

## 2023-07-22 DIAGNOSIS — R931 Abnormal findings on diagnostic imaging of heart and coronary circulation: Secondary | ICD-10-CM

## 2023-07-22 DIAGNOSIS — Z7901 Long term (current) use of anticoagulants: Secondary | ICD-10-CM

## 2023-07-22 DIAGNOSIS — I10 Essential (primary) hypertension: Secondary | ICD-10-CM

## 2023-07-24 ENCOUNTER — Ambulatory Visit: Payer: BC Managed Care – PPO | Attending: Cardiology

## 2023-07-24 DIAGNOSIS — I679 Cerebrovascular disease, unspecified: Secondary | ICD-10-CM | POA: Diagnosis not present

## 2023-07-24 DIAGNOSIS — R931 Abnormal findings on diagnostic imaging of heart and coronary circulation: Secondary | ICD-10-CM

## 2023-07-24 DIAGNOSIS — E782 Mixed hyperlipidemia: Secondary | ICD-10-CM | POA: Diagnosis not present

## 2023-07-24 DIAGNOSIS — I480 Paroxysmal atrial fibrillation: Secondary | ICD-10-CM

## 2023-07-24 LAB — EXERCISE TOLERANCE TEST
Angina Index: 0
Base ST Depression (mm): 0 mm
Duke Treadmill Score: 6
Estimated workload: 7
Exercise duration (min): 6 min
Exercise duration (sec): 0 s
MPHR: 161 {beats}/min
Peak HR: 160 {beats}/min
Percent HR: 99 %
RPE: 17
Rest HR: 74 {beats}/min
ST Depression (mm): 0 mm

## 2023-08-04 ENCOUNTER — Ambulatory Visit: Payer: BC Managed Care – PPO | Admitting: Adult Health

## 2023-08-04 ENCOUNTER — Encounter: Payer: Self-pay | Admitting: Adult Health

## 2023-08-04 VITALS — BP 138/90 | HR 74 | Temp 99.2°F | Ht 75.0 in | Wt 246.8 lb

## 2023-08-04 DIAGNOSIS — Z8673 Personal history of transient ischemic attack (TIA), and cerebral infarction without residual deficits: Secondary | ICD-10-CM

## 2023-08-04 DIAGNOSIS — R918 Other nonspecific abnormal finding of lung field: Secondary | ICD-10-CM

## 2023-08-04 DIAGNOSIS — I10 Essential (primary) hypertension: Secondary | ICD-10-CM | POA: Diagnosis not present

## 2023-08-04 DIAGNOSIS — G4733 Obstructive sleep apnea (adult) (pediatric): Secondary | ICD-10-CM | POA: Diagnosis not present

## 2023-08-04 DIAGNOSIS — E1159 Type 2 diabetes mellitus with other circulatory complications: Secondary | ICD-10-CM

## 2023-08-04 DIAGNOSIS — I480 Paroxysmal atrial fibrillation: Secondary | ICD-10-CM

## 2023-08-04 NOTE — Patient Instructions (Signed)
 Continue on CPAP At bedtime Keep up good work .  Work on healthy weight .  Do not drive if sleepy.  CT chest as planned per cardiology in March 2025 to follow up on lung nodules .  Follow up with Dr. Vassie Loll  in 1 year and As needed

## 2023-08-04 NOTE — Progress Notes (Addendum)
 @Patient  ID: Charles Phelps, male    DOB: Nov 03, 1965, 58 y.o.   MRN: [id number]  Chief Complaint  Patient presents with   Follow-up    Referring provider: Jimmy Charlie FERNS, MD  HPI: 58 year old male followed for severe obstructive sleep apnea on nocturnal CPAP Medical history significant for atrial fibrillation, stroke (September 2024), diabetes, hypertension  TEST/EVENTS :  NPSG 2005:  AHI 86/hr.   08/04/23 Follow up : OSA  Patient presents for a follow-up visit.  Last seen June 2023.  Patient has a longstanding severe sleep apnea.  He remains on CPAP each night.  Patient says he cannot sleep without his CPAP.  He continues to do well on CPAP.  Wears a CPAP at least 8 hours each night.  CPAP download shows 100% compliance.  Daily average usage at 9 hours.  Patient is on auto CPAP 5 to 13 cm H2O.  AHI 1.3/hour.  Daily average pressure 12.3 cm H2O..  Patient uses a nasal mask.  Since last visit patient was admitted to the hospital September 2024 for ataxia.  He was found to have an acute/subacute nonhemorrhagic right pontine infarct.  Workup revealed a basilar artery stenosis.  Seen by interventional radiology, underwent cerebral angiogram April 09, 2023 that showed mild stenosis of the proximal basilar artery significantly improved from prior CT angiogram.  Did not require angioplasty or stenting.  Patient was changed from Xarelto  to Eliquis .  Underwent transesophageal echo July 14, 2023 that showed atrial septal aneurysm with small PFO, positive saline microcavitation study, EF 55 to 60%, right ventricular size normal.  Right ventricular systolic function normal.  Exercise stress test done on July 24, 2023 was clinically negative for ischemia.  Positive PVCs at peak exercise. Patient says overall he is improved his coordination has slowly improved but is not totally back to baseline.  Has not regained his energy level back.  And does have some depth perception issues that  remain. Coronary CT chest showed multiple groundglass pulmonary nodules scattered throughout the lungs measuring maximum 14 x 12 mm in the right lower lobe.  Patient has been recommended for a follow-up CT chest in 3 months.  This has been ordered by cardiology.  Patient is a never smoker.  Denies any hemoptysis or unintentional weight loss.   Allergies  Allergen Reactions   Atorvastatin  Other (See Comments)    Memory problems    Immunization History  Administered Date(s) Administered   Pneumococcal Conjugate-13 01/30/2017   Td 07/30/1998, 10/27/2009, 09/14/2019    Past Medical History:  Diagnosis Date   Allergic rhinitis    Anxiety    Arthritis    Atrial fibrillation (HCC)    Diabetes (HCC)    Elevated LFTs 2000   fatty liver   GERD (gastroesophageal reflux disease)    Glucose intolerance (impaired glucose tolerance)    Gout    Hyperlipidemia    Hypertension    IBS (irritable bowel syndrome)    Obstructive sleep apnea    Osteoarthritis     Tobacco History: Social History   Tobacco Use  Smoking Status Never  Smokeless Tobacco Never   Counseling given: Not Answered   Outpatient Medications Prior to Visit  Medication Sig Dispense Refill   apixaban  (ELIQUIS ) 5 MG TABS tablet Take 1 tablet (5 mg total) by mouth 2 (two) times daily. 60 tablet 11   cetirizine (ZYRTEC) 10 MG tablet Take 10 mg by mouth daily.     citalopram  (CELEXA ) 20 MG tablet TAKE 1 TABLET  BY MOUTH DAILY 90 tablet 3   colchicine  0.6 MG tablet TAKE 1 TABLET BY MOUTH  TWICE DAILY AS NEEDED 180 tablet 1   cyanocobalamin  1000 MCG tablet Take 1,000 mcg by mouth daily.     diltiazem  (CARDIZEM  CD) 120 MG 24 hr capsule TAKE 1 CAPSULE BY MOUTH DAILY 90 capsule 3   FARXIGA  10 MG TABS tablet TAKE 1 TABLET BY MOUTH DAILY  BEFORE BREAKFAST 90 tablet 3   glucose blood (ONETOUCH VERIO) test strip Use to check blood sugar 1-2 times daily 200 each 11   hyoscyamine  (LEVSIN ) 0.125 MG tablet Take 1 tablet (0.125 mg  total) by mouth 3 (three) times daily as needed. 60 tablet 1   lisinopril  (ZESTRIL ) 20 MG tablet TAKE 1 TABLET BY MOUTH DAILY 90 tablet 3   metFORMIN  (GLUCOPHAGE -XR) 500 MG 24 hr tablet TAKE 2 TABLETS BY MOUTH DAILY  WITH BREAKFAST 180 tablet 3   Methylcellulose, Laxative, (CITRUCEL) 500 MG TABS Take 1 tablet by mouth daily as needed (constipation).     Multiple Vitamin (MULTIVITAMIN WITH MINERALS) TABS tablet Take 1 tablet by mouth daily.     omeprazole  (PRILOSEC) 20 MG capsule TAKE 1 CAPSULE BY MOUTH DAILY 90 capsule 3   OneTouch Delica Lancets 33G MISC Use to obtain blood sugar sample 1-2 times daily. 200 each 11   rosuvastatin  (CRESTOR ) 20 MG tablet Take 1 tablet (20 mg total) by mouth daily. 90 tablet 3   valACYclovir  (VALTREX ) 1000 MG tablet TAKE 2 TABLETS BY MOUTH TWICE  DAILY FOR 1 DAY FOR COLD SORE (Patient taking differently: as needed.) 20 tablet 3   No facility-administered medications prior to visit.     Review of Systems:   Constitutional:   No  weight loss, night sweats,  Fevers, chills, +fatigue, or  lassitude.  HEENT:   No headaches,  Difficulty swallowing,  Tooth/dental problems, or  Sore throat,                No sneezing, itching, ear ache, nasal congestion, post nasal drip,   CV:  No chest pain,  Orthopnea, PND, swelling in lower extremities, anasarca, dizziness, palpitations, syncope.   GI  No heartburn, indigestion, abdominal pain, nausea, vomiting, diarrhea, change in bowel habits, loss of appetite, bloody stools.   Resp: No shortness of breath with exertion or at rest.  No excess mucus, no productive cough,  No non-productive cough,  No coughing up of blood.  No change in color of mucus.  No wheezing.  No chest wall deformity  Skin: no rash or lesions.  GU: no dysuria, change in color of urine, no urgency or frequency.  No flank pain, no hematuria   MS:  No joint pain or swelling.  No decreased range of motion.  No back pain.    Physical Exam  BP (!)  138/90 (BP Location: Left Arm, Patient Position: Sitting, Cuff Size: Large)   Pulse 74   Temp 99.2 F (37.3 C) (Oral)   Ht 6' 3 (1.905 m)   Wt 246 lb 12.8 oz (111.9 kg)   SpO2 99%   BMI 30.85 kg/m   GEN: A/Ox3; pleasant , NAD, well nourished    HEENT:  [place]/AT,  , NOSE-clear, THROAT-clear, no lesions, no postnasal drip or exudate noted.   NECK:  Supple w/ fair ROM; no JVD; normal carotid impulses w/o bruits; no thyromegaly or nodules palpated; no lymphadenopathy.    RESP  Clear  P & A; w/o, wheezes/ rales/ or rhonchi. no  accessory muscle use, no dullness to percussion  CARD:  RRR, no m/r/g, no peripheral edema, pulses intact, no cyanosis or clubbing.  GI:   Soft & nt; nml bowel sounds; no organomegaly or masses detected.   Musco: Warm bil, no deformities or joint swelling noted.   Neuro: alert, no focal deficits noted.    Skin: Warm, no lesions or rashes    Lab Results:  CBC    BNP No results found for: BNP  ProBNP No results found for: PROBNP  Imaging: EXERCISE TOLERANCE TEST (ETT) Result Date: 07/24/2023   Exercise stress test;  Clinically negative for ischemia   Electrically negative for ischemia with no ST changes to suggest ischemia   However the pt did have PVCs/couplets at near peak exercise and 4 then 5 beat VT in the immediate recovery period  Consider other imaging modality with greater sensitivity to rule out ischemia.   ECHO TEE Result Date: 07/14/2023    TRANSESOPHOGEAL ECHO REPORT   Patient Name:   LATERRIAN HEVENER Date of Exam: 07/14/2023 Medical Rec #:  [id number]         Height:       75.0 in Accession #:    [id number]        Weight:       245.0 lb Date of Birth:  02-27-1966        BSA:          2.392 m Patient Age:    57 years          BP:           170/98 mmHg Patient Gender: M                 HR:           68 bpm. Exam Location:  Inpatient Procedure: Transesophageal Echo, Color Doppler and Cardiac Doppler Indications:     Stroke i63.9  History:          Patient has prior history of Echocardiogram examinations, most                  recent 04/07/2023. Arrythmias:Atrial Fibrillation; Risk                  Factors:Hypertension, Diabetes, Dyslipidemia and Sleep Apnea.  Sonographer:     Damien Senior RDCS Referring Phys:  REDELL GORMAN SHALLOW Diagnosing Phys: Redell Shallow MD PROCEDURE: After discussion of the risks and benefits of a TEE, an informed consent was obtained from the patient. The transesophogeal probe was passed without difficulty through the esophogus of the patient. Sedation performed by different physician. The patient was monitored while under deep sedation. Anesthestetic sedation was provided intravenously by Anesthesiology: 280mg  of Propofol . The patient developed no complications during the procedure.  IMPRESSIONS  1. Atrial septal aneurysm with small PFO noted; positive saline microcavitation study with abdominal pressure.  2. Left ventricular ejection fraction, by estimation, is 55 to 60%. The left ventricle has normal function. The left ventricle has no regional wall motion abnormalities.  3. Right ventricular systolic function is normal. The right ventricular size is normal.  4. No left atrial/left atrial appendage thrombus was detected.  5. The mitral valve is normal in structure. Mild mitral valve regurgitation.  6. The aortic valve is tricuspid. Aortic valve regurgitation is trivial.  7. There is mild (Grade II) plaque involving the descending aorta.  8. Evidence of atrial level shunting detected by color flow Doppler. Agitated saline contrast bubble study was  positive with shunting observed within 3-6 cardiac cycles suggestive of interatrial shunt. There is a small patent foramen ovale. FINDINGS  Left Ventricle: Left ventricular ejection fraction, by estimation, is 55 to 60%. The left ventricle has normal function. The left ventricle has no regional wall motion abnormalities. The left ventricular internal cavity size was normal in size.  Right Ventricle: The right ventricular size is normal. Right ventricular systolic function is normal. Left Atrium: Left atrial size was normal in size. No left atrial/left atrial appendage thrombus was detected. Right Atrium: Right atrial size was normal in size. Pericardium: There is no evidence of pericardial effusion. Mitral Valve: The mitral valve is normal in structure. Mild mitral valve regurgitation. Tricuspid Valve: The tricuspid valve is normal in structure. Tricuspid valve regurgitation is trivial. Aortic Valve: The aortic valve is tricuspid. Aortic valve regurgitation is trivial. Aortic valve mean gradient measures 4.0 mmHg. Aortic valve peak gradient measures 5.7 mmHg. Pulmonic Valve: The pulmonic valve was normal in structure. Pulmonic valve regurgitation is not visualized. Aorta: The aortic root is normal in size and structure. There is mild (Grade II) plaque involving the descending aorta. IAS/Shunts: The interatrial septum is aneurysmal. Evidence of atrial level shunting detected by color flow Doppler. Agitated saline contrast bubble study was positive with shunting observed within 3-6 cardiac cycles suggestive of interatrial shunt. A small patent foramen ovale is detected. Additional Comments: Atrial septal aneurysm with small PFO noted; positive saline microcavitation study with abdominal pressure. Spectral Doppler performed. AORTIC VALVE AV Vmax:      119.00 cm/s AV Vmean:     91.800 cm/s AV VTI:       0.259 m AV Peak Grad: 5.7 mmHg AV Mean Grad: 4.0 mmHg Redell Shallow MD Electronically signed by Redell Shallow MD Signature Date/Time: 07/14/2023/11:09:36 AM    Final    EP STUDY Result Date: 07/14/2023 See surgical note for result.  CT CARDIAC SCORING (SELF PAY ONLY) Addendum Date: 07/12/2023 ADDENDUM REPORT: 07/12/2023 14:39 EXAM: OVER-READ INTERPRETATION  CT CHEST The following report is an over-read performed by radiologist Dr. Toribio Cove [facility] on 07/12/2023.  This over-read does not include interpretation of cardiac or coronary anatomy or pathology. The coronary calcium  score interpretation by the cardiologist is attached. COMPARISON:  None available. FINDINGS: Multiple small ground-glass attenuation nodules scattered throughout the lungs bilaterally, the majority of which are clustered in the right lower lobe. The largest of these is on axial image 15 of series 2 measuring 14 x 12 mm. Within the visualized portions of the thorax there is no acute consolidative airspace disease, no pleural effusions, no pneumothorax and no lymphadenopathy. Atherosclerotic calcifications in the thoracic aorta. Visualized portions of the upper abdomen are unremarkable. There are no aggressive appearing lytic or blastic lesions noted in the visualized portions of the skeleton. IMPRESSION: 1. Multiple ground-glass attenuation pulmonary nodules scattered throughout the lungs bilaterally measuring up to 14 x 12 mm in the right lower lobe. Non-contrast chest CT at 3-6 months is recommended. If nodules persist, subsequent management will be based upon the most suspicious nodule(s). This recommendation follows the consensus statement: Guidelines for Management of Incidental Pulmonary Nodules Detected on CT Images: From the Fleischner Society 2017; Radiology 2017; 284:228-243. 2. Aortic Atherosclerosis (ICD10-I70.0). Electronically Signed   By: Toribio Aye M.D.   On: 07/12/2023 14:39   Result Date: 07/12/2023 CLINICAL DATA:  Cardiovascular disease risk stratification Mixed hyperlipidemia EXAM: CT Coronary Calcium  Score TECHNIQUE: A gated, non-contrast computed tomography scan of the heart was performed  using 3mm slice thickness. Axial images were analyzed on a dedicated workstation. Calcium  scoring of the coronary arteries was performed using the Agatston method. FINDINGS: Coronary Calcium  Score: Left main: 0 Left anterior descending artery: 102 Left circumflex artery: 1.25 Right coronary  artery: 8.2 Total: 112 Percentile: 72nd Pericardium: Normal. Ascending Aorta: Normal caliber. Ascending aorta measures approximately 35mm at the mid ascending aorta measured in a non-contrast axial plane. Non-cardiac: See separate report from [facility]. IMPRESSION: Coronary calcium  score of 112. This was 72nd percentile for age-, race-, and sex-matched controls. RECOMMENDATIONS: Coronary artery calcium  (CAC) score is a strong predictor of incident coronary heart disease (CHD) and provides predictive information beyond traditional risk factors. CAC scoring is reasonable to use in the decision to withhold, postpone, or initiate statin therapy in intermediate-risk or selected borderline-risk asymptomatic adults (age 64-75 years and LDL-C >=70 to <190 mg/dL) who do not have diabetes or established atherosclerotic cardiovascular disease (ASCVD).* In intermediate-risk (10-year ASCVD risk >=7.5% to <20%) adults or selected borderline-risk (10-year ASCVD risk >=5% to <7.5%) adults in whom a CAC score is measured for the purpose of making a treatment decision the following recommendations have been made: If CAC=0, it is reasonable to withhold statin therapy and reassess in 5 to 10 years, as long as higher risk conditions are absent (diabetes mellitus, family history of premature CHD in first degree relatives (males <55 years; females <65 years), cigarette smoking, or LDL >=190 mg/dL). If CAC is 1 to 99, it is reasonable to initiate statin therapy for patients >=61 years of age. If CAC is >=100 or >=75th percentile, it is reasonable to initiate statin therapy at any age. Cardiology referral should be considered for patients with CAC scores >=400 or >=75th percentile. *2018 AHA/ACC/AACVPR/AAPA/ABC/ACPM/ADA/AGS/APhA/ASPC/NLA/PCNA Guideline on the Management of Blood Cholesterol: A Report of the American College of Cardiology/American Heart Association Task Force on Clinical Practice Guidelines. J Am Coll Cardiol.  2019;73(24):3168-3209. Electronically Signed: By: Soyla Merck M.D. On: 07/08/2023 16:37    Administration History     None           No data to display          No results found for: NITRICOXIDE      Assessment & Plan:   Assessment and Plan  Severe obstructive sleep apnea-has excellent control and compliance. Well-controlled with CPAP therapy.  We will continue using CPAP nightly .  Patient education on sleep apnea and CPAP care. Is on a healthy sleep regimen.  Encouraged on healthy weight loss.  Recent stroke (03/2023) with underlying A-fib.  Workup revealed underlying small PFO on Echo. Changed from Xarelto  to Eliquis .  Recommend continued follow-up with neurology and cardiology.    Diabetes Mellitus Continue on current regimen and follow-up with primary care.  General Health Maintenance He declined the flu shot and COVID-19 vaccination.  Patient education given  Morbid obesity.  Continue with healthy weight loss  A-fib-continue on current regimen and follow-up with cardiology   Lung Nodules Lung nodules measuring up to 14 mm were identified on a December CT scan, with no history of smoking or significant occupational exposures. A previous CT scan 10 years prior showed small, calcified nodules. We will monitor for stability and repeat the CT chest in March to ensure stability.  Keep follow-up with cardiology as planned.  Contact pulmonary if needed  Follow-up Follow-up in 1 year with Dr. Alva and as needed    Madelin Stank, NP 08/04/2023

## 2023-08-16 DIAGNOSIS — G4733 Obstructive sleep apnea (adult) (pediatric): Secondary | ICD-10-CM | POA: Diagnosis not present

## 2023-08-19 ENCOUNTER — Encounter: Payer: Self-pay | Admitting: Internal Medicine

## 2023-08-19 ENCOUNTER — Other Ambulatory Visit (HOSPITAL_COMMUNITY): Payer: Self-pay

## 2023-08-19 ENCOUNTER — Other Ambulatory Visit: Payer: Self-pay

## 2023-08-19 ENCOUNTER — Encounter: Payer: Self-pay | Admitting: Neurology

## 2023-08-19 ENCOUNTER — Telehealth (INDEPENDENT_AMBULATORY_CARE_PROVIDER_SITE_OTHER): Payer: BC Managed Care – PPO | Admitting: Cardiology

## 2023-08-19 DIAGNOSIS — Q2112 Patent foramen ovale: Secondary | ICD-10-CM | POA: Diagnosis not present

## 2023-08-19 DIAGNOSIS — R918 Other nonspecific abnormal finding of lung field: Secondary | ICD-10-CM

## 2023-08-19 DIAGNOSIS — R931 Abnormal findings on diagnostic imaging of heart and coronary circulation: Secondary | ICD-10-CM

## 2023-08-19 DIAGNOSIS — R9431 Abnormal electrocardiogram [ECG] [EKG]: Secondary | ICD-10-CM

## 2023-08-19 DIAGNOSIS — R9439 Abnormal result of other cardiovascular function study: Secondary | ICD-10-CM

## 2023-08-19 DIAGNOSIS — E785 Hyperlipidemia, unspecified: Secondary | ICD-10-CM

## 2023-08-19 DIAGNOSIS — Z8673 Personal history of transient ischemic attack (TIA), and cerebral infarction without residual deficits: Secondary | ICD-10-CM

## 2023-08-19 MED ORDER — METOPROLOL TARTRATE 25 MG PO TABS
25.0000 mg | ORAL_TABLET | Freq: Two times a day (BID) | ORAL | 0 refills | Status: DC
  Filled 2023-08-19: qty 6, 3d supply, fill #0

## 2023-08-19 NOTE — Telephone Encounter (Signed)
CARDIOLOGY 08/19/23  Patient's name: Charles Phelps.   MRN: [id number].    DOB: 07-Jun-1966 Primary care provider: [name], MD.  Patient location: Home Physician location: Office  Interaction regarding this patient's care today: Reviewed the results of the coronary calcium score, TEE, and GXT.  GXT 07/24/2023 Exercise stress test; Clinically negative for ischemia Electrically negative for ischemia with no ST changes to suggest ischemia However the pt did have PVCs/couplets at near peak exercise and 4 then 5 beat VT in the immediate recovery period Consider other imaging modality with greater sensitivity to rule out ischemia.   CAC 07/08/2023 Coronary calcium score of 112. This was 72nd percentile for age-,race-, and sex-matched controls.  Radiology overread: 1. Multiple ground-glass attenuation pulmonary nodules scattered throughout the lungs bilaterally measuring up to 14 x 12 mm in the right lower lobe. Non-contrast chest CT at 3-6 months is recommended. If nodules persist, subsequent management will be based upon the most suspicious nodule(s). This recommendation follows the consensus statement: Guidelines for Management of Incidental Pulmonary Nodules Detected on CT Images: From the Fleischner Society 2017; Radiology 2017; 284:228-243. 2. Aortic Atherosclerosis (ICD10-I70.0).  TEE 07/14/2023  1. Atrial septal aneurysm with small PFO noted; positive saline  microcavitation study with abdominal pressure.   2. Left ventricular ejection fraction, by estimation, is 55 to 60%. The  left ventricle has normal function. The left ventricle has no regional  wall motion abnormalities.   3. Right ventricular systolic function is normal. The right ventricular  size is normal.   4. No left atrial/left atrial appendage thrombus was detected.   5. The mitral valve is normal in structure. Mild mitral valve  regurgitation.   6. The aortic valve is tricuspid. Aortic valve  regurgitation is trivial.   7. There is mild (Grade II) plaque involving the descending aorta.   8. Evidence of atrial level shunting detected by color flow Doppler.  Agitated saline contrast bubble study was positive with shunting observed  within 3-6 cardiac cycles suggestive of interatrial shunt. There is a  small patent foramen ovale.  Impression:   ICD-10-CM   1. Agatston coronary artery calcium score between 100 and 400  R93.1 CT CORONARY MORPH W/CTA COR W/SCORE W/CA W/CM &/OR WO/CM    Basic metabolic panel    metoprolol tartrate (LOPRESSOR) 25 MG tablet    2. Equivocal stress test  R94.39 CT CORONARY MORPH W/CTA COR W/SCORE W/CA W/CM &/OR WO/CM    Basic metabolic panel    metoprolol tartrate (LOPRESSOR) 25 MG tablet    3. Multiple nodules of lung  R91.8     4. PFO (patent foramen ovale)  Q21.12     5. History of recent stroke  Z86.73     6. Abnormal electrocardiogram  R94.31 CT CORONARY MORPH W/CTA COR W/SCORE W/CA W/CM &/OR WO/CM      Meds ordered this encounter  Medications   metoprolol tartrate (LOPRESSOR) 25 MG tablet    Sig: Take 1 tablet (25 mg total) by mouth 2 (two) times daily for 6 doses. Start 2 days before heart scan. Hold if systolic blood pressure (top number) less than 100 mmHg or pulse less than 60 bpm.    Dispense:  6 tablet    Refill:  0    Orders Placed This Encounter  Procedures   CT CORONARY MORPH W/CTA COR W/SCORE W/CA W/CM &/OR WO/CM   Basic metabolic panel    Recommendations: Agatston coronary artery calcium score between 100 and 400 Equivocal stress  test Noted to have moderate CAC and underwent GXT.   At peak exercise patient was noted to have PVCs and during immediate recovery nonsustained ventricular tachycardia. Given multiple cardiovascular risk factors were discussed continuation of AV nodal blocking agents and additional workup to rule out reversible ischemia or obstructive disease.  Shared decision was to proceed with coronary  CTA. Patient is advised to get his coronary CTA scheduled. Check BMP, to evaluate kidney function Start Lopressor 25 mg p.o. twice daily-2 days prior to the CTA and the day of the scan (w/  holding parameters) Hold metformin 2 days before the CT scan in 2 days after the CT scan Patient will have fasting lipids rechecked with PCP, he was started on rosuvastatin after his recent stroke in September 2024.  I am hopeful that his LDL is less than 55 mg/dL on current statin therapy.  Multiple nodules of lung Noted incidentally on coronary calcium score. Has established care with pulmonary medicine. Repeat CT of the chest is scheduled for March 2025.  Results will be forwarded to pulmonary medicine to help coordinate his care.  PFO (patent foramen ovale) History of recent stroke During stroke workup had a TEE done which noted a small PFO. I have asked him to follow-up with his neurologist to see if the PFO should be closed given his recent stroke as per the effective distribution.  If neurology recommends PFO closure we will refer him to Dr. Excell Seltzer for further evaluation and management  Telephone encounter total time: 35 minutes.   Tessa Lerner, DO, [facility]  [facility]  [address] [place], [place] [address] 08/19/2023 12:44 PM

## 2023-08-20 ENCOUNTER — Encounter: Payer: Self-pay | Admitting: Cardiology

## 2023-08-21 ENCOUNTER — Other Ambulatory Visit (HOSPITAL_COMMUNITY): Payer: Self-pay

## 2023-08-21 ENCOUNTER — Encounter: Payer: Self-pay | Admitting: Internal Medicine

## 2023-08-21 ENCOUNTER — Other Ambulatory Visit (INDEPENDENT_AMBULATORY_CARE_PROVIDER_SITE_OTHER): Payer: BC Managed Care – PPO

## 2023-08-21 DIAGNOSIS — E785 Hyperlipidemia, unspecified: Secondary | ICD-10-CM | POA: Diagnosis not present

## 2023-08-21 LAB — LIPID PANEL
Cholesterol: 117 mg/dL (ref 0–200)
HDL: 38.8 mg/dL — ABNORMAL LOW (ref >39.00)
LDL Cholesterol: 44 mg/dL (ref 0–99)
NonHDL: 78.23
Total CHOL/HDL Ratio: 3
Triglycerides: 172 mg/dL — ABNORMAL HIGH (ref 0.0–149.0)
VLDL: 34.4 mg/dL (ref 0.0–40.0)

## 2023-08-21 NOTE — Telephone Encounter (Signed)
Thank you for the update.   Keenan Trefry Castalia, DO, [facility]

## 2023-08-28 DIAGNOSIS — R931 Abnormal findings on diagnostic imaging of heart and coronary circulation: Secondary | ICD-10-CM | POA: Diagnosis not present

## 2023-08-28 DIAGNOSIS — R9439 Abnormal result of other cardiovascular function study: Secondary | ICD-10-CM | POA: Diagnosis not present

## 2023-08-28 LAB — BASIC METABOLIC PANEL
BUN/Creatinine Ratio: 14 (ref 9–20)
BUN: 17 mg/dL (ref 6–24)
CO2: 24 mmol/L (ref 20–29)
Calcium: 9.7 mg/dL (ref 8.7–10.2)
Chloride: 100 mmol/L (ref 96–106)
Creatinine, Ser: 1.18 mg/dL (ref 0.76–1.27)
Glucose: 139 mg/dL — ABNORMAL HIGH (ref 70–99)
Potassium: 3.8 mmol/L (ref 3.5–5.2)
Sodium: 142 mmol/L (ref 134–144)
eGFR: 71 mL/min/{1.73_m2} (ref >59)

## 2023-09-01 ENCOUNTER — Ambulatory Visit (HOSPITAL_COMMUNITY)
Admission: RE | Admit: 2023-09-01 | Discharge: 2023-09-01 | Disposition: A | Discharge status: Home / Self Care | Payer: BC Managed Care – PPO | Source: Ambulatory Visit | Attending: Cardiology | Admitting: Cardiology

## 2023-09-01 DIAGNOSIS — R9439 Abnormal result of other cardiovascular function study: Secondary | ICD-10-CM | POA: Diagnosis not present

## 2023-09-01 DIAGNOSIS — I2510 Atherosclerotic heart disease of native coronary artery without angina pectoris: Secondary | ICD-10-CM

## 2023-09-01 DIAGNOSIS — R931 Abnormal findings on diagnostic imaging of heart and coronary circulation: Secondary | ICD-10-CM | POA: Insufficient documentation

## 2023-09-01 DIAGNOSIS — R918 Other nonspecific abnormal finding of lung field: Secondary | ICD-10-CM | POA: Insufficient documentation

## 2023-09-01 DIAGNOSIS — R9431 Abnormal electrocardiogram [ECG] [EKG]: Secondary | ICD-10-CM | POA: Insufficient documentation

## 2023-09-01 HISTORY — DX: Other nonspecific abnormal finding of lung field: R91.8

## 2023-09-01 MED ORDER — NITROGLYCERIN 0.4 MG SL SUBL
0.8000 mg | SUBLINGUAL_TABLET | Freq: Once | SUBLINGUAL | Status: AC
  Administered 2023-09-01: 0.8 mg via SUBLINGUAL

## 2023-09-01 MED ORDER — IOHEXOL 350 MG/ML SOLN
95.0000 mL | Freq: Once | INTRAVENOUS | Status: AC | PRN
  Administered 2023-09-01: 95 mL via INTRAVENOUS

## 2023-09-01 MED ORDER — NITROGLYCERIN 0.4 MG SL SUBL
SUBLINGUAL_TABLET | SUBLINGUAL | Status: AC
  Filled 2023-09-01: qty 2

## 2023-09-16 DIAGNOSIS — G4733 Obstructive sleep apnea (adult) (pediatric): Secondary | ICD-10-CM | POA: Diagnosis not present

## 2023-09-23 ENCOUNTER — Ambulatory Visit: Payer: BC Managed Care – PPO | Admitting: Internal Medicine

## 2023-09-23 ENCOUNTER — Encounter: Payer: Self-pay | Admitting: Internal Medicine

## 2023-09-23 VITALS — BP 102/70 | HR 84 | Temp 98.7°F | Ht 75.0 in | Wt 245.0 lb

## 2023-09-23 DIAGNOSIS — I480 Paroxysmal atrial fibrillation: Secondary | ICD-10-CM | POA: Diagnosis not present

## 2023-09-23 DIAGNOSIS — E1159 Type 2 diabetes mellitus with other circulatory complications: Secondary | ICD-10-CM | POA: Diagnosis not present

## 2023-09-23 DIAGNOSIS — J069 Acute upper respiratory infection, unspecified: Secondary | ICD-10-CM | POA: Insufficient documentation

## 2023-09-23 DIAGNOSIS — F39 Unspecified mood [affective] disorder: Secondary | ICD-10-CM | POA: Diagnosis not present

## 2023-09-23 DIAGNOSIS — Z7984 Long term (current) use of oral hypoglycemic drugs: Secondary | ICD-10-CM

## 2023-09-23 LAB — POCT GLYCOSYLATED HEMOGLOBIN (HGB A1C): Hemoglobin A1C: 7.4 % — AB (ref 4.0–5.6)

## 2023-09-23 MED ORDER — DOXYCYCLINE HYCLATE 100 MG PO TABS: 100.0000 mg | ORAL_TABLET | Freq: Two times a day (BID) | ORAL | 0 refills | Status: DC

## 2023-09-23 NOTE — Progress Notes (Signed)
 Subjective:    Patient ID: Charles Phelps, male    DOB: 14-Jun-1966, 58 y.o.   MRN: [id number]  HPI Here for follow up of diabetes and other chronic medical conditions  Is sick--4 days now Eyes draining  Some head congestion--and mild in chest Coughing--occ mucus No fever---but wakes in sweat. Slight cold this morning--and some body aching No SOB other than at night wearing CPAP Tylenol and nyquil---able to sleep  Frustrated by repeated respiratory infections this year  No neurologic changes Still some trouble with hand/eye coordination---so not back to golf yet Cardiac workup showed small PFO--but not action planned Stress test okay  Not checking sugars  Has spot on abdomen that bulges with cough ?hernia Not really painful  Current Outpatient Medications on File Prior to Visit  Medication Sig Dispense Refill   apixaban (ELIQUIS) 5 MG TABS tablet Take 1 tablet (5 mg total) by mouth 2 (two) times daily. 60 tablet 11   cetirizine (ZYRTEC) 10 MG tablet Take 10 mg by mouth daily.     citalopram (CELEXA) 20 MG tablet TAKE 1 TABLET BY MOUTH DAILY 90 tablet 3   colchicine 0.6 MG tablet TAKE 1 TABLET BY MOUTH  TWICE DAILY AS NEEDED 180 tablet 1   cyanocobalamin 1000 MCG tablet Take 1,000 mcg by mouth daily.     diltiazem (CARDIZEM CD) 120 MG 24 hr capsule TAKE 1 CAPSULE BY MOUTH DAILY 90 capsule 3   FARXIGA 10 MG TABS tablet TAKE 1 TABLET BY MOUTH DAILY  BEFORE BREAKFAST 90 tablet 3   glucose blood (ONETOUCH VERIO) test strip Use to check blood sugar 1-2 times daily 200 each 11   hyoscyamine (LEVSIN) 0.125 MG tablet Take 1 tablet (0.125 mg total) by mouth 3 (three) times daily as needed. 60 tablet 1   lisinopril (ZESTRIL) 20 MG tablet TAKE 1 TABLET BY MOUTH DAILY 90 tablet 3   metFORMIN (GLUCOPHAGE-XR) 500 MG 24 hr tablet TAKE 2 TABLETS BY MOUTH DAILY  WITH BREAKFAST 180 tablet 3   Multiple Vitamin (MULTIVITAMIN WITH MINERALS) TABS tablet Take 1 tablet by mouth daily.      omeprazole (PRILOSEC) 20 MG capsule TAKE 1 CAPSULE BY MOUTH DAILY 90 capsule 3   OneTouch Delica Lancets 33G MISC Use to obtain blood sugar sample 1-2 times daily. 200 each 11   rosuvastatin (CRESTOR) 20 MG tablet Take 1 tablet (20 mg total) by mouth daily. 90 tablet 3   valACYclovir (VALTREX) 1000 MG tablet TAKE 2 TABLETS BY MOUTH TWICE  DAILY FOR 1 DAY FOR COLD SORE (Patient taking differently: as needed.) 20 tablet 3   No current facility-administered medications on file prior to visit.    Allergies  Allergen Reactions   Atorvastatin Other (See Comments)    Memory problems    Past Medical History:  Diagnosis Date   Allergic rhinitis    Anxiety    Arthritis    Atrial fibrillation (HCC)    Diabetes (HCC)    Elevated LFTs 2000   fatty liver   GERD (gastroesophageal reflux disease)    Glucose intolerance (impaired glucose tolerance)    Gout    Hyperlipidemia    Hypertension    IBS (irritable bowel syndrome)    Obstructive sleep apnea    Osteoarthritis     Past Surgical History:  Procedure Laterality Date   IR ANGIO INTRA EXTRACRAN SEL INTERNAL CAROTID BILAT MOD SED  04/09/2023   IR ANGIO VERTEBRAL SEL VERTEBRAL BILAT MOD SED  04/09/2023  IR ENDOVASC INTRACRANIAL INF OTHER THAN THROMBO ART INC DIAG ANGIO EA ADD  04/09/2023   IR US GUIDE VASC ACCESS RIGHT  04/09/2023   NO PAST SURGERIES     RADIOLOGY WITH ANESTHESIA N/A 04/09/2023   Procedure: IR WITH ANESTHESIA;  Surgeon: Radiologist, Medication, MD;  Location: [facility] OR;  Service: Radiology;  Laterality: N/A;   TRANSESOPHAGEAL ECHOCARDIOGRAM (CATH LAB) N/A 07/14/2023   Procedure: TRANSESOPHAGEAL ECHOCARDIOGRAM;  Surgeon: Lewayne Bunting, MD;  Location: [facility] INVASIVE CV LAB;  Service: Cardiovascular;  Laterality: N/A;    Family History  Problem Relation Age of Onset   Hypertension Mother    Diabetes Mother    Heart disease Father        CABG at 25   Cancer Father        lung   Colon polyps Father    Diabetes Sister     Diabetes Brother    Heart failure Brother    Colon cancer Neg Hx    Stomach cancer Neg Hx    Esophageal cancer Neg Hx     Social History   Socioeconomic History   Marital status: Married    Spouse name: Not on file   Number of children: 1   Years of education: Not on file   Highest education level: Not on file  Occupational History   Occupation: Museum/gallery conservator / Proofreader     Comment: Retired  Tobacco Use   Smoking status: Never   Smokeless tobacco: Never  Vaping Use   Vaping status: Never Used  Substance and Sexual Activity   Alcohol use: Yes    Comment: Occasionally   Drug use: No   Sexual activity: Not on file  Other Topics Concern   Not on file  Social History Narrative   Not on file   Social Drivers of Health   Financial Resource Strain: Not on file  Food Insecurity: No Food Insecurity (04/11/2023)   Hunger Vital Sign    Worried About Running Out of Food in the Last Year: Never true    Ran Out of Food in the Last Year: Never true  Transportation Needs: No Transportation Needs (04/11/2023)   PRAPARE - Administrator, Civil Service (Medical): No    Lack of Transportation (Non-Medical): No  Physical Activity: Not on file  Stress: Not on file  Social Connections: Not on file  Intimate Partner Violence: Not At Risk (04/07/2023)   Humiliation, Afraid, Rape, and Kick questionnaire    Fear of Current or Ex-Partner: No    Emotionally Abused: No    Physically Abused: No    Sexually Abused: No   Review of Systems Usually sleeps okay with the CPAP Appetite is okay     Objective:   Physical Exam Constitutional:      Appearance: Normal appearance.  HENT:     Head:     Comments: No sinus tenderness    Right Ear: Tympanic membrane and ear canal normal.     Left Ear: Tympanic membrane and ear canal normal.     Mouth/Throat:     Pharynx: No oropharyngeal exudate or posterior oropharyngeal erythema.  Cardiovascular:     Rate and Rhythm: Normal rate  and regular rhythm.     Pulses: Normal pulses.     Heart sounds: No murmur heard.    No gallop.  Pulmonary:     Effort: Pulmonary effort is normal.     Breath sounds: Normal breath sounds. No wheezing or rales.  Musculoskeletal:     Cervical back: Neck supple.     Right lower leg: No edema.     Left lower leg: No edema.  Lymphadenopathy:     Cervical: No cervical adenopathy.  Skin:    Findings: No rash.  Neurological:     Mental Status: He is alert.  Psychiatric:        Mood and Affect: Mood normal.            Assessment & Plan:

## 2023-09-23 NOTE — Addendum Note (Signed)
 Addended by: Eual Fines on: 09/23/2023 09:38 AM   Modules accepted: Orders

## 2023-09-23 NOTE — Assessment & Plan Note (Signed)
 A1c up slightly to 7.4% Discussed adding medication to farxiga 10 and metformin 1000 daily He will work on lifestyle and no change in meds for now

## 2023-09-23 NOTE — Assessment & Plan Note (Signed)
 Still seems viral Has had recurrent sinus problems though Discussed supportive Rx If worsens, will take antibiotic

## 2023-09-23 NOTE — Assessment & Plan Note (Signed)
 Frustrated by recurrent infections Still on citalopram 20 daily

## 2023-09-23 NOTE — Assessment & Plan Note (Signed)
 Is on the apixaban 5 bid and diltiazam 120 daily

## 2023-09-29 ENCOUNTER — Encounter: Payer: Self-pay | Admitting: Cardiology

## 2023-09-29 ENCOUNTER — Encounter: Payer: Self-pay | Admitting: Internal Medicine

## 2023-10-09 ENCOUNTER — Encounter: Payer: Self-pay | Admitting: Internal Medicine

## 2023-10-09 MED ORDER — APIXABAN 5 MG PO TABS: 5.0000 mg | ORAL_TABLET | Freq: Two times a day (BID) | ORAL | 3 refills | Status: AC

## 2023-12-09 ENCOUNTER — Ambulatory Visit: Payer: BC Managed Care – PPO | Admitting: Adult Health

## 2023-12-09 ENCOUNTER — Encounter: Payer: Self-pay | Admitting: Adult Health

## 2023-12-09 VITALS — BP 129/86 | HR 75 | Ht 75.0 in | Wt 252.0 lb

## 2023-12-09 DIAGNOSIS — G4733 Obstructive sleep apnea (adult) (pediatric): Secondary | ICD-10-CM | POA: Diagnosis not present

## 2023-12-09 DIAGNOSIS — I6350 Cerebral infarction due to unspecified occlusion or stenosis of unspecified cerebral artery: Secondary | ICD-10-CM | POA: Diagnosis not present

## 2023-12-09 DIAGNOSIS — Q2112 Patent foramen ovale: Secondary | ICD-10-CM | POA: Diagnosis not present

## 2023-12-09 NOTE — Progress Notes (Signed)
 [facility] [address] [place]. [place] [address] [phone]       OFFICE FOLLOW-UP NOTE  Mr. VAYDEN SLIFE Date of Birth:  [birth year] Medical Record Number:  [id number]    Primary neurologist: Dr. Janett Medin Reason for visit: Stroke follow-up  Chief Complaint  Patient presents with   Cerebrovascular Accident    Rm 3 with spouse Pt is well and stable, reports no new CVA concerns since last visit.       HPI:   Update 12/09/2023 JM: Patient returns for follow-up visit accompanied by his wife.  Reports continued difficulties with balance, depth Perception and short-term memory difficulties.  Symptoms seem to be worse especially with fatigue or lack of sleep.  He does feel overall improvement since prior visit although still limited at times with daily functioning.  He does drive occasionally but limited due to depth perception issue, he is unable to work at any heights or use a ladder.  Ambulates without AD, no recent falls, does have difficulty with turning quickly.  Currently applying for Social Security disability.  No new stroke/TIA symptoms.  Reports compliance on Eliquis  and Crestor .  Routinely follows with PCP and cardiology.  Reports compliance with CPAP, routinely follows with pulmonology.  No further questions or concerns at this time.      Initial visit 05/29/2023 Dr. Janett Medin: Mr. Braunstein is a 58 year old Caucasian male seen today for initial office follow-up visit following hospital consultation for stroke in September 2024.  He is accompanied by his wife.  History is obtained from them and review of electronic medical records.  I personally reviewed pertinent available imaging films in PACS.  He has past medical history of diabetes, hypertension, hyperlipidemia, obesity, sleep apnea, paroxysmal A-fib, anxiety and depression.  He presented on 04/05/2023 for evaluation for feeling of incoordination.  He went out to play golf and stated that he felt like he  had never swung a golf club before he was so incoordinated.  He denied any diplopia, vertigo, numbness or weakness.  Patient was on Xarelto  for atrial fibrillation.  CT scan showed questionable central pontine hypodensity.  MRI scan of the brain was obtained which confirmed acute nonhemorrhagic right pontine ventral infarct.  Remote age paramedian left pontine infarct was also noted.  There is remote age right basal ganglia lacunar infarct and left lateral cerebellar infarct as well.  CT angiogram showed high-grade stenosis of proximal basilar artery with atrial type origin of the right posterior cerebral.  2D echo showed ejection fraction of 6065% with left ventricular concentric hypertrophy.  Patient Xarelto  was held and started on IV heparin  for 3 days before going diagnostic cerebral catheter angiogram on 04/09/2023 which actually showed only mild stenosis of proximal basilar artery and patient was not felt to be a candidate for angioplasty stenting.  LDL cholesterol was 97 3 months ago.  Hemoglobin A1c was 8.0.  Patient was switched from IV heparin  to Eliquis  as it was felt to be a Xarelto  failure.  Therapies recommended outpatient therapies.  Patient states he has done well since discharge.  He is tolerating Eliquis  well without bruising or bleeding.  His ataxia and balance have improved however he feels his stamina is not the same he tires very easily and cannot walk for long.  He is also having trouble with depth perception and while playing golf he had trouble hitting the ball.  He also had a 22nd episode of diplopia and he felt tired while playing golf last week.  He  does have underlying chronic anxiety and depression for which she takes Celexa  20 mg daily but feels this is under control.  He denies any injuries , falls or LOC headaches.  He does have sleep apnea and uses his CPAP every night.   ROS:   14 system review of systems is positive for those listed in HPI and all other systems negative  PMH:   Past Medical History:  Diagnosis Date   Allergic rhinitis    Anxiety    Arthritis    Atrial fibrillation (HCC)    Diabetes (HCC)    Elevated LFTs 2000   fatty liver   GERD (gastroesophageal reflux disease)    Glucose intolerance (impaired glucose tolerance)    Gout    Hyperlipidemia    Hypertension    IBS (irritable bowel syndrome)    Lung nodules    Lung nodules 09/01/2023   needs follow up in 3-6 months   Obstructive sleep apnea    Osteoarthritis     Social History:  Social History   Socioeconomic History   Marital status: Married    Spouse name: Not on file   Number of children: 1   Years of education: Not on file   Highest education level: Not on file  Occupational History   Occupation: Museum/gallery conservator / Proofreader     Comment: Retired  Tobacco Use   Smoking status: Never   Smokeless tobacco: Never  Vaping Use   Vaping status: Never Used  Substance and Sexual Activity   Alcohol use: Yes    Comment: Occasionally   Drug use: No   Sexual activity: Not on file  Other Topics Concern   Not on file  Social History Narrative   Not on file   Social Drivers of Health   Financial Resource Strain: Not on file  Food Insecurity: No Food Insecurity (04/11/2023)   Hunger Vital Sign    Worried About Running Out of Food in the Last Year: Never true    Ran Out of Food in the Last Year: Never true  Transportation Needs: No Transportation Needs (04/11/2023)   PRAPARE - Administrator, Civil Service (Medical): No    Lack of Transportation (Non-Medical): No  Physical Activity: Not on file  Stress: Not on file  Social Connections: Not on file  Intimate Partner Violence: Not At Risk (04/07/2023)   Humiliation, Afraid, Rape, and Kick questionnaire    Fear of Current or Ex-Partner: No    Emotionally Abused: No    Physically Abused: No    Sexually Abused: No    Medications:   Current Outpatient Medications on File Prior to Visit  Medication Sig Dispense  Refill   apixaban  (ELIQUIS ) 5 MG TABS tablet Take 1 tablet (5 mg total) by mouth 2 (two) times daily. 180 tablet 3   cetirizine (ZYRTEC) 10 MG tablet Take 10 mg by mouth daily.     citalopram  (CELEXA ) 20 MG tablet TAKE 1 TABLET BY MOUTH DAILY 90 tablet 3   colchicine  0.6 MG tablet TAKE 1 TABLET BY MOUTH  TWICE DAILY AS NEEDED 180 tablet 1   cyanocobalamin  1000 MCG tablet Take 1,000 mcg by mouth daily.     diltiazem  (CARDIZEM  CD) 120 MG 24 hr capsule TAKE 1 CAPSULE BY MOUTH DAILY 90 capsule 3   FARXIGA  10 MG TABS tablet TAKE 1 TABLET BY MOUTH DAILY  BEFORE BREAKFAST 90 tablet 3   glucose blood (ONETOUCH VERIO) test strip Use to check blood sugar  1-2 times daily 200 each 11   hyoscyamine  (LEVSIN) 0.125 MG tablet Take 1 tablet (0.125 mg total) by mouth 3 (three) times daily as needed. 60 tablet 1   lisinopril  (ZESTRIL ) 20 MG tablet TAKE 1 TABLET BY MOUTH DAILY 90 tablet 3   metFORMIN  (GLUCOPHAGE -XR) 500 MG 24 hr tablet TAKE 2 TABLETS BY MOUTH DAILY  WITH BREAKFAST 180 tablet 3   Multiple Vitamin (MULTIVITAMIN WITH MINERALS) TABS tablet Take 1 tablet by mouth daily.     omeprazole  (PRILOSEC) 20 MG capsule TAKE 1 CAPSULE BY MOUTH DAILY 90 capsule 3   OneTouch Delica Lancets 33G MISC Use to obtain blood sugar sample 1-2 times daily. 200 each 11   rosuvastatin  (CRESTOR ) 20 MG tablet Take 1 tablet (20 mg total) by mouth daily. 90 tablet 3   valACYclovir  (VALTREX ) 1000 MG tablet TAKE 2 TABLETS BY MOUTH TWICE  DAILY FOR 1 DAY FOR COLD SORE (Patient taking differently: as needed.) 20 tablet 3   No current facility-administered medications on file prior to visit.    Allergies:   Allergies  Allergen Reactions   Atorvastatin  Other (See Comments)    Memory problems    Physical Exam Today's Vitals   12/09/23 1515  BP: 129/86  Pulse: 75  Weight: 252 lb (114.3 kg)  Height: 6\' 3"  (1.905 m)   Body mass index is 31.5 kg/m.  General: well developed, well nourished, seated, in no evident  distress Head: head normocephalic and atraumatic.  Neck: supple with no carotid or supraclavicular bruits Cardiovascular: regular rate and rhythm, no murmurs Musculoskeletal: no deformity Skin:  no rash/petichiae Vascular:  Normal pulses all extremities  Neurologic Exam Mental Status: Awake and fully alert.  Fluent speech and language.  Oriented to place and time. Recent memory subjectively impaired and remote memory intact. Attention span, concentration and fund of knowledge appropriate. Mood and affect appropriate.  Cranial Nerves: Pupils equal, briskly reactive to light. Extraocular movements full without nystagmus. Visual fields full to confrontation. Hearing intact. Facial sensation intact. Face, tongue, palate moves normally and symmetrically.  Motor: Normal bulk and tone. Normal strength in all tested extremity muscles. Sensory.: intact to touch ,pinprick .position and vibratory sensation.  Coordination: Rapid alternating movements normal in all extremities. Finger-to-nose and heel-to-shin performed accurately bilaterally. Gait and Station: Arises from chair without difficulty. Stance is normal. Gait demonstrates normal stride length and balance with mild difficulty with turns. Able to heel, toe and tandem walk with mild to moderate difficulty.  Reflexes: 1+ and symmetric. Toes downgoing.       ASSESSMENT: 58 year old Caucasian male with right pontine infarct in September 2024 secondary to atrial fibrillation and mild basilar stenosis.  Vascular risk factors of diabetes, pretension, hyperlipidemia, obesity, sleep apnea, PFO, intracranial stenosis and paroxysmal atrial fibrillation     PLAN:  1.  Right pontine stroke  - Overall greatly improved with residual imbalance, impaired depth perception and short-term memory difficulties.  Encouraged continued exercises at home.  In the process of applying for Social Security disability.  - Continue Eliquis  5 mg twice daily and Crestor  20  mg daily for secondary stroke prevention and A-fib managed/prescribed by PCP/cardiology  - Continue close PCP and cardiology follow-up for aggressive stroke risk factor management  - Stroke labs: A1c 7.4 (08/2023), LDL 44 (07/2023)  2.  PFO  -Per Dr. Janett Medin, was not felt to be cause of stroke  - Multiple vascular risk factors including HTN, HLD, DM, A-fib and OSA  - No indication for closure at  this time and recommend continued medical management and aggressive risk factor control  3.  OSA on CPAP  - Discussed importance of continued nightly usage  - Continue close follow-up with pulmonology for ongoing monitoring     No further recommendations from stroke standpoint and is closely being followed by multiple other specialties.  He can follow-up on an as-needed basis but encouraged to call with any questions or concerns in the future.     I spent 25 minutes of face-to-face and non-face-to-face time with patient and wife.  This included previsit chart review, lab review, study review, order entry, electronic health record documentation, patient education and discussion regarding above diagnoses and treatment plan and answered all other questions to patient's satisfaction  Johny Nap, [facility]  [facility] [address] [place], [place] [address]  Phone [phone] Fax [phone] Note: This document was prepared with digital dictation and possible smart phrase technology. Any transcriptional errors that result from this process are unintentional.

## 2023-12-09 NOTE — Patient Instructions (Signed)
 Continue Eliquis   and Crestor   for secondary stroke prevention  Continue to follow up with PCP regarding cholesterol, blood pressure and diabetes management  Maintain strict control of hypertension with blood pressure goal below 130/90, diabetes with hemoglobin A1c goal below 7.0 % and cholesterol with LDL cholesterol (bad cholesterol) goal below 70 mg/dL.   Signs of a Stroke? Follow the BEFAST method:  Balance Watch for a sudden loss of balance, trouble with coordination or vertigo Eyes Is there a sudden loss of vision in one or both eyes? Or double vision?  Face: Ask the person to smile. Does one side of the face droop or is it numb?  Arms: Ask the person to raise both arms. Does one arm drift downward? Is there weakness or numbness of a leg? Speech: Ask the person to repeat a simple phrase. Does the speech sound slurred/strange? Is the person confused ? Time: If you observe any of these signs, call 911.         Thank you for coming to see us  at [facility]. I hope we have been able to provide you high quality care today.  You may receive a patient satisfaction survey over the next few weeks. We would appreciate your feedback and comments so that we may continue to improve ourselves and the health of our patients.

## 2024-01-05 ENCOUNTER — Encounter: Payer: Self-pay | Admitting: Cardiology

## 2024-01-05 ENCOUNTER — Ambulatory Visit: Attending: Cardiology | Admitting: Cardiology

## 2024-01-05 VITALS — BP 130/90 | HR 68 | Ht 75.0 in | Wt 253.0 lb

## 2024-01-05 DIAGNOSIS — I480 Paroxysmal atrial fibrillation: Secondary | ICD-10-CM | POA: Diagnosis not present

## 2024-01-05 DIAGNOSIS — I2510 Atherosclerotic heart disease of native coronary artery without angina pectoris: Secondary | ICD-10-CM | POA: Diagnosis not present

## 2024-01-05 DIAGNOSIS — Z7901 Long term (current) use of anticoagulants: Secondary | ICD-10-CM

## 2024-01-05 DIAGNOSIS — E782 Mixed hyperlipidemia: Secondary | ICD-10-CM | POA: Diagnosis not present

## 2024-01-05 DIAGNOSIS — Z6831 Body mass index (BMI) 31.0-31.9, adult: Secondary | ICD-10-CM

## 2024-01-05 DIAGNOSIS — F1010 Alcohol abuse, uncomplicated: Secondary | ICD-10-CM

## 2024-01-05 DIAGNOSIS — Q2112 Patent foramen ovale: Secondary | ICD-10-CM

## 2024-01-05 DIAGNOSIS — Z8673 Personal history of transient ischemic attack (TIA), and cerebral infarction without residual deficits: Secondary | ICD-10-CM

## 2024-01-05 DIAGNOSIS — I10 Essential (primary) hypertension: Secondary | ICD-10-CM

## 2024-01-05 DIAGNOSIS — G4730 Sleep apnea, unspecified: Secondary | ICD-10-CM

## 2024-01-05 DIAGNOSIS — E6609 Other obesity due to excess calories: Secondary | ICD-10-CM

## 2024-01-05 DIAGNOSIS — E66811 Other obesity due to excess calories: Secondary | ICD-10-CM

## 2024-01-05 NOTE — Patient Instructions (Signed)
 Medication Instructions:  No medication changes were made at this visit. Continue current regimen.   *If you need a refill on your cardiac medications before your next appointment, please call your pharmacy*  Lab Work: None ordered today. If you have labs (blood work) drawn today and your tests are completely normal, you will receive your results only by: MyChart Message (if you have MyChart) OR A paper copy in the mail If you have any lab test that is abnormal or we need to change your treatment, we will call you to review the results.  Testing/Procedures: None ordered today.  Follow-Up: At [facility], you and your health needs are our priority.  As part of our continuing mission to provide you with exceptional heart care, our providers are all part of one team.  This team includes your primary Cardiologist (physician) and Advanced Practice Providers or APPs (Physician Assistants and Nurse Practitioners) who all work together to provide you with the care you need, when you need it.  Your next appointment:   6 month(s)  Provider:   Olinda Bertrand, DO

## 2024-01-05 NOTE — Progress Notes (Signed)
 Cardiology Office Note:    Date:  01/05/2024  NAME:  Charles Phelps    MRN: [id number] DOB:  February 05, 1966   PCP:  Helaine Llanos, MD  Former Cardiology Providers: N/A Primary Cardiologist:  Olinda Bertrand, DO, [facility] (established care 07/07/2023) Electrophysiologist:  None   Referring MD: Helaine Llanos, MD  Reason of Consult: Recent stroke  Chief Complaint  Patient presents with   Follow-up    Afib and review test results     History of Present Illness:    Charles Phelps is a 59 y.o. Caucasian male whose past medical history and cardiovascular risk factors includes: Hypertension, diabetes, OSA on CPAP, paroxysmal atrial fibrillation (2018), aortic atherosclerosis, history of Etoh abuse (September 2024), history of right pontine infarct.   Patient presents to the office to establish.  Given his history of atrial fibrillation and recent stroke.  Patient presented to the hospital in September 2024 with ataxia for 5 days prior.  He presented to the ED and was noted to have acute/subacute nonhemorrhagic right pontine infarct.  He was noted to have basilar artery stenosis and was seen by interventional radiology.  On April 09, 2023 he underwent cerebral angiogram which noted mild stenosis of the proximal basilar artery, significantly improved from prior CT angiogram.  Therefore no angioplasty or stenting warranted.  Xarelto  was changed to Eliquis .  Patient was later discharged home and referred to cardiology to establish care given his comorbidities.  At his last office visit in December 2024 recommended undergoing exercise treadmill stress test and echocardiogram given the recent stroke and to further stratify him from a cardiovascular standpoint. GXT reported to be negative for ischemia but electrically noted to have frequent PVCs/ventricular couplets at peak exercise and in immediate recovery.  Coronary calcium  score was moderate CAC.  Shared decision was to proceed with  coronary CTA.  Results reviewed with him in detail and noted below for further reference.  On the coronary calcium  score patient was also noted to have pulmonary nodules and recommended follow-up with pulmonary medicine/PCP for further guidance.  Since then patient has followed up with pulmonary medicine, last visit January 2025.  He also underwent a transesophageal echocardiogram to evaluate for possible PFO given his recent stroke.  He did undergo TEE in December 2024 and was noted to have a small PFO.  He was referred to neurology for further recommendations.  He was evaluated by Dr.Sethi grateful for his recommendations.  Shared decision was to hold off on PFO closure at this time as his stroke was likely secondary to atherosclerotic burden involving the vessels.  Patient presents today for 75-month follow-up visit.  Since last office visit patient denies any anginal chest pain or heart failure symptoms.  No hospitalizations or urgent care visits for cardiovascular reasons.  He has been compliant with his medical therapy.  Physical endurance remains stable.  Has gained a few pound since last office visit and office blood pressures are not at goal.  He does not check his blood pressures at home.  Patient is accompanied by his wife at today's visit who also provides collateral history.     Current Medications: Current Meds  Medication Sig   apixaban  (ELIQUIS ) 5 MG TABS tablet Take 1 tablet (5 mg total) by mouth 2 (two) times daily.   cetirizine (ZYRTEC) 10 MG tablet Take 10 mg by mouth daily.   citalopram  (CELEXA ) 20 MG tablet TAKE 1 TABLET BY MOUTH DAILY   colchicine  0.6 MG tablet TAKE  1 TABLET BY MOUTH  TWICE DAILY AS NEEDED   cyanocobalamin  1000 MCG tablet Take 1,000 mcg by mouth daily.   diltiazem  (CARDIZEM  CD) 120 MG 24 hr capsule TAKE 1 CAPSULE BY MOUTH DAILY   FARXIGA  10 MG TABS tablet TAKE 1 TABLET BY MOUTH DAILY  BEFORE BREAKFAST   glucose blood (ONETOUCH VERIO) test strip Use to check  blood sugar 1-2 times daily   hyoscyamine  (LEVSIN) 0.125 MG tablet Take 1 tablet (0.125 mg total) by mouth 3 (three) times daily as needed.   lisinopril  (ZESTRIL ) 20 MG tablet TAKE 1 TABLET BY MOUTH DAILY   metFORMIN  (GLUCOPHAGE -XR) 500 MG 24 hr tablet TAKE 2 TABLETS BY MOUTH DAILY  WITH BREAKFAST   Multiple Vitamin (MULTIVITAMIN WITH MINERALS) TABS tablet Take 1 tablet by mouth daily.   omeprazole  (PRILOSEC) 20 MG capsule TAKE 1 CAPSULE BY MOUTH DAILY   OneTouch Delica Lancets 33G MISC Use to obtain blood sugar sample 1-2 times daily.   rosuvastatin  (CRESTOR ) 20 MG tablet Take 1 tablet (20 mg total) by mouth daily.   valACYclovir  (VALTREX ) 1000 MG tablet TAKE 2 TABLETS BY MOUTH TWICE  DAILY FOR 1 DAY FOR COLD SORE (Patient taking differently: as needed.)     Allergies:    Atorvastatin    Past Medical History: Past Medical History:  Diagnosis Date   Allergic rhinitis    Anxiety    Arthritis    Atrial fibrillation (HCC)    Diabetes (HCC)    Elevated LFTs 2000   fatty liver   GERD (gastroesophageal reflux disease)    Glucose intolerance (impaired glucose tolerance)    Gout    Hyperlipidemia    Hypertension    IBS (irritable bowel syndrome)    Lung nodules    Lung nodules 09/01/2023   needs follow up in 3-6 months   Obstructive sleep apnea    Osteoarthritis     Past Surgical History: Past Surgical History:  Procedure Laterality Date   IR ANGIO INTRA EXTRACRAN SEL INTERNAL CAROTID BILAT MOD SED  04/09/2023   IR ANGIO VERTEBRAL SEL VERTEBRAL BILAT MOD SED  04/09/2023   IR ENDOVASC INTRACRANIAL INF OTHER THAN THROMBO ART INC DIAG ANGIO EA ADD  04/09/2023   IR US  GUIDE VASC ACCESS RIGHT  04/09/2023   NO PAST SURGERIES     RADIOLOGY WITH ANESTHESIA N/A 04/09/2023   Procedure: IR WITH ANESTHESIA;  Surgeon: Radiologist, Medication, MD;  Location: [facility] OR;  Service: Radiology;  Laterality: N/A;   TRANSESOPHAGEAL ECHOCARDIOGRAM (CATH LAB) N/A 07/14/2023   Procedure: TRANSESOPHAGEAL  ECHOCARDIOGRAM;  Surgeon: Lenise Quince, MD;  Location: [facility] INVASIVE CV LAB;  Service: Cardiovascular;  Laterality: N/A;    Social History: Social History   Tobacco Use   Smoking status: Never   Smokeless tobacco: Never  Vaping Use   Vaping status: Never Used  Substance Use Topics   Alcohol use: Yes    Comment: Occasionally   Drug use: No    Family History: Family History  Problem Relation Age of Onset   Hypertension Mother    Diabetes Mother    Heart disease Father        CABG at 35   Cancer Father        lung   Colon polyps Father    Diabetes Sister    Diabetes Brother    Heart failure Brother    Colon cancer Neg Hx    Stomach cancer Neg Hx    Esophageal cancer Neg Hx  ROS:   Review of Systems  Cardiovascular:  Negative for chest pain, claudication, irregular heartbeat, leg swelling, near-syncope, orthopnea, palpitations, paroxysmal nocturnal dyspnea and syncope.  Respiratory:  Negative for shortness of breath.   Hematologic/Lymphatic: Negative for bleeding problem.    EKGs/Labs/Other Studies Reviewed:   EKG: EKG Interpretation Date/Time:  Monday January 05 2024 09:37:45 EDT Ventricular Rate:  68 PR Interval:  172 QRS Duration:  96 QT Interval:  430 QTC Calculation: 457 R Axis:   -3  Text Interpretation: Normal sinus rhythm Nonspecific T wave abnormality When compared with ECG of 07-Jul-2023 11:20, No significant change was found Confirmed by Olinda Bertrand ([id number]) on 01/05/2024 9:43:32 AM  Echocardiogram: September 2024: LVEF 60-65%.  Grade 1 diastolic dysfunction.  Right ventricular size and function normal. No significant valvular heart disease. Estimated RAP 3 mmHg. See report for additional details  Coronary Calcium  Score 07/08/2023: Total: 112 Percentile: 72nd Ascending Aorta: Normal caliber. Ascending aorta measures approximately 35mm at the mid ascending aorta measured in a non-contrast axial plane.  Radiology Overread: Multiple  ground-glass attenuation pulmonary nodules scattered throughout the lungs bilaterally measuring up to 14 x 12 mm in the right lower lobe. Non-contrast chest CT at 3-6 months is recommended. If nodules persist, subsequent management will be based upon the most suspicious nodule(s).  TEE 07/14/2023  1. Atrial septal aneurysm with small PFO noted; positive saline  microcavitation study with abdominal pressure.   2. Left ventricular ejection fraction, by estimation, is 55 to 60%. The left ventricle has normal function. The left ventricle has no regional wall motion abnormalities.   3. Right ventricular systolic function is normal. The right ventricular size is normal.   4. No left atrial/left atrial appendage thrombus was detected.   5. The mitral valve is normal in structure. Mild mitral valve regurgitation.   6. The aortic valve is tricuspid. Aortic valve regurgitation is trivial.   7. There is mild (Grade II) plaque involving the descending aorta.   8. Evidence of atrial level shunting detected by color flow Doppler.  Agitated saline contrast bubble study was positive with shunting observed within 3-6 cardiac cycles suggestive of interatrial shunt. There is a small patent foramen ovale.   Coronary CTA 09/01/2023 1. Coronary calcium  score of 96.4. This was 68 percentile for age-, sex, and race-matched controls.  2. Normal coronary origin with right dominance.  3. Mild stenoses in the LAD and LCx.  4. Total plaque volume 281 mm3 which is 53 percentile for age- and sex-matched controls (calcified plaque 21 mm3; non-calcified plaque 260 mm3). TPV is (severe).  5. Aortic atherosclerosis.  6. Radiology overread: No acute extracardiac incidental findings.   RECOMMENDATIONS: CAD-RADS 2: Mild non-obstructive CAD (25-49%). Consider non-atherosclerotic causes of chest pain. Consider preventive therapy and risk factor modification.    RADIOLOY: MR Brain w/ and w/o contrast 03/2023 1.  Acute/subacute nonhemorrhagic right pontine infarct. 2. Remote left paramedian pontine infarct. 3. Remote nonhemorrhagic infarct of the right lentiform nucleus. 4. Remote lacunar infarct of the lateral left cerebellum.  Labs:    Latest Ref Rng & Units 07/08/2023    4:21 PM 04/09/2023    6:33 AM 04/08/2023    6:31 AM  CBC  WBC 3.4 - 10.8 x10E3/uL 10.1  6.0  5.7   Hemoglobin 13.0 - 17.7 g/dL 60.4  54.0  98.1   Hematocrit 37.5 - 51.0 % 44.7  43.8  40.9   Platelets 150 - 450 x10E3/uL 240  153  139  Latest Ref Rng & Units 08/28/2023    8:33 AM 07/08/2023    4:21 PM 04/09/2023    6:33 AM  BMP  Glucose 70 - 99 mg/dL 914  782  956   BUN 6 - 24 mg/dL 17  20  10    Creatinine 0.76 - 1.27 mg/dL 2.13  0.86  5.78   BUN/Creat Ratio 9 - 20 14  14     Sodium 134 - 144 mmol/L 142  141  139   Potassium 3.5 - 5.2 mmol/L 3.8  4.3  3.5   Chloride 96 - 106 mmol/L 100  102  104   CO2 20 - 29 mmol/L 24  22  21    Calcium  8.7 - 10.2 mg/dL 9.7  9.8  9.0       Latest Ref Rng & Units 08/28/2023    8:33 AM 07/08/2023    4:21 PM 04/09/2023    6:33 AM  CMP  Glucose 70 - 99 mg/dL 469  629  528   BUN 6 - 24 mg/dL 17  20  10    Creatinine 0.76 - 1.27 mg/dL 4.13  2.44  0.10   Sodium 134 - 144 mmol/L 142  141  139   Potassium 3.5 - 5.2 mmol/L 3.8  4.3  3.5   Chloride 96 - 106 mmol/L 100  102  104   CO2 20 - 29 mmol/L 24  22  21    Calcium  8.7 - 10.2 mg/dL 9.7  9.8  9.0     Lab Results  Component Value Date   CHOL 117 08/21/2023   HDL 38.80 (L) 08/21/2023   LDLCALC 44 08/21/2023   LDLDIRECT 131.0 12/19/2022   TRIG 172.0 (H) 08/21/2023   CHOLHDL 3 08/21/2023   No results for input(s): "LIPOA" in the last 8760 hours. No components found for: "NTPROBNP" No results for input(s): "PROBNP" in the last 8760 hours. Recent Labs    04/06/23 0100  TSH 2.157    Physical Exam:    Today's Vitals   01/05/24 0934  BP: (!) 130/90  Pulse: 68  SpO2: 97%  Weight: 253 lb (114.8 kg)  Height: 6\' 3"  (1.905  m)   Body mass index is 31.62 kg/m. Wt Readings from Last 3 Encounters:  01/05/24 253 lb (114.8 kg)  12/09/23 252 lb (114.3 kg)  09/23/23 245 lb (111.1 kg)    Physical Exam  Constitutional: No distress.  hemodynamically stable  Neck: No JVD present.  Cardiovascular: Normal rate, regular rhythm, S1 normal and S2 normal. Exam reveals no gallop, no S3 and no S4.  No murmur heard. Pulmonary/Chest: Effort normal and breath sounds normal. No stridor. He has no wheezes. He has no rales.  Abdominal: Soft. Bowel sounds are normal. He exhibits no distension. There is no abdominal tenderness.  Musculoskeletal:        General: No edema.     Cervical back: Neck supple.  Neurological: He is alert and oriented to person, place, and time. He has intact cranial nerves (2-12).  Skin: Skin is warm.     Impression & Recommendation(s):  Impression:   ICD-10-CM   1. Paroxysmal atrial fibrillation (HCC)  I48.0 EKG 12-Lead    2. Long term (current) use of anticoagulants  Z79.01     3. Nonobstructive atherosclerosis of coronary artery  I25.10     4. Mixed hyperlipidemia  E78.2 EKG 12-Lead    5. History of recent stroke  Z86.73     6. PFO (patent foramen ovale)  Q21.12     7. Benign hypertension  I10     8. Sleep apnea in adult  G47.30     9. Alcohol abuse  F10.10     10. Class 1 obesity due to excess calories with serious comorbidity and body mass index (BMI) of 31.0 to 31.9 in adult  E66.811    E66.09    Z68.31        Recommendation(s):  Paroxysmal atrial fibrillation (HCC) Long term (current) use of anticoagulants Rate control: Diltiazem  120 mg p.o. daily. Rhythm control: N/A. Thromboembolic prophylaxis: Eliquis  5 mg p.o. twice daily Patient does not endorse evidence of bleeding. Will have repeat labs with PCP in June/July 2025 Risks, benefits, alternatives to oral anticoagulation rate discussed at today's visit  Click Here to Calculate/Change CHADS2VASc Score The patient's  CHADS2-VASc score is 5, indicating a 7.2% annual risk of stroke.    Nonobstructive atherosclerosis of coronary artery Noted to have CAD RADS 2 disease on recent coronary CTA suggestive of mild nonobstructive CAD involving the LAD/LCx. Not on antiplatelets as he is currently on anticoagulation for A-fib management. Currently on Crestor  20 mg p.o. nightly. LDL is at goal, triglycerides still need to improve. EKG is nonischemic. Has undergone extensive cardiovascular testing as mentioned above No additional testing warranted at this time  Mixed hyperlipidemia Currently on Crestor  20 mg p.o. daily.   He denies myalgia or other side effects. Most recent lipids dated January 2025, independently reviewed as noted above.  LDL is 44 mg/dL.  Cardiology following peripherally, managed by primary care provider.  History of recent stroke PFO (patent foramen ovale) Had a right pontine infarct September 2024 Diagnosed with A-fib back in 2018, Xarelto  was changed to Eliquis  Does follow-up with neurology. Has stopped alcohol consumption  as of September 2024. Stroke workup included a transesophageal echocardiogram which noted a small PFO.  Discussed with neurology and shared decision was to hold off on PFO closure as his stroke is likely secondary to atherosclerotic disease. Reemphasized the importance of secondary prevention with focus on improving her modifiable cardiovascular risk factors such as glycemic control, lipid management, blood pressure control, weight loss.  Benign hypertension Office home blood pressures are not at goal. Home blood pressures are not checked. Currently on lisinopril  20 mg p.o. daily. Currently on Farxiga  10 mg p.o. daily. Recommend checking his ambulatory blood pressure readings and if SBP > 130 mmHg or DBP greater than 80 mmHg to call PCP for further guidance  Sleep apnea in adult Endorses compliance with his CPAP regularly.  Alcohol abuse Quit in September  2024. Reemphasized importance of complete cessation.  Class 1 obesity due to excess calories with serious comorbidity and body mass index (BMI) of 31.0 to 31.9 in adult Body mass index is 31.62 kg/m. I reviewed with him importance of diet, regular physical activity/exercise, weight loss.   Patient is educated on the importance of increasing physical activity gradually as tolerated with a goal of moderate intensity exercise for 30 minutes a day 5 days a week.  Orders Placed:  Orders Placed This Encounter  Procedures   EKG 12-Lead     Final Medication List:   No orders of the defined types were placed in this encounter.   There are no discontinued medications.   Current Outpatient Medications:    apixaban  (ELIQUIS ) 5 MG TABS tablet, Take 1 tablet (5 mg total) by mouth 2 (two) times daily., Disp: 180 tablet, Rfl: 3   cetirizine (ZYRTEC) 10 MG tablet, Take 10  mg by mouth daily., Disp: , Rfl:    citalopram  (CELEXA ) 20 MG tablet, TAKE 1 TABLET BY MOUTH DAILY, Disp: 90 tablet, Rfl: 3   colchicine  0.6 MG tablet, TAKE 1 TABLET BY MOUTH  TWICE DAILY AS NEEDED, Disp: 180 tablet, Rfl: 1   cyanocobalamin  1000 MCG tablet, Take 1,000 mcg by mouth daily., Disp: , Rfl:    diltiazem  (CARDIZEM  CD) 120 MG 24 hr capsule, TAKE 1 CAPSULE BY MOUTH DAILY, Disp: 90 capsule, Rfl: 3   FARXIGA  10 MG TABS tablet, TAKE 1 TABLET BY MOUTH DAILY  BEFORE BREAKFAST, Disp: 90 tablet, Rfl: 3   glucose blood (ONETOUCH VERIO) test strip, Use to check blood sugar 1-2 times daily, Disp: 200 each, Rfl: 11   hyoscyamine  (LEVSIN) 0.125 MG tablet, Take 1 tablet (0.125 mg total) by mouth 3 (three) times daily as needed., Disp: 60 tablet, Rfl: 1   lisinopril  (ZESTRIL ) 20 MG tablet, TAKE 1 TABLET BY MOUTH DAILY, Disp: 90 tablet, Rfl: 3   metFORMIN  (GLUCOPHAGE -XR) 500 MG 24 hr tablet, TAKE 2 TABLETS BY MOUTH DAILY  WITH BREAKFAST, Disp: 180 tablet, Rfl: 3   Multiple Vitamin (MULTIVITAMIN WITH MINERALS) TABS tablet, Take 1 tablet by  mouth daily., Disp: , Rfl:    omeprazole  (PRILOSEC) 20 MG capsule, TAKE 1 CAPSULE BY MOUTH DAILY, Disp: 90 capsule, Rfl: 3   OneTouch Delica Lancets 33G MISC, Use to obtain blood sugar sample 1-2 times daily., Disp: 200 each, Rfl: 11   rosuvastatin  (CRESTOR ) 20 MG tablet, Take 1 tablet (20 mg total) by mouth daily., Disp: 90 tablet, Rfl: 3   valACYclovir  (VALTREX ) 1000 MG tablet, TAKE 2 TABLETS BY MOUTH TWICE  DAILY FOR 1 DAY FOR COLD SORE (Patient taking differently: as needed.), Disp: 20 tablet, Rfl: 3  Consent:   NA  Disposition:   6 months sooner if needed.   His questions and concerns were addressed to his satisfaction. He voices understanding of the recommendations provided during this encounter.    Signed, Awilda Bogus, [facility]  A Division of [facility] [address] [place], [place] [address]  [place], [place] [address] 01/05/2024 10:48 AM

## 2024-01-21 ENCOUNTER — Other Ambulatory Visit: Payer: Self-pay | Admitting: Internal Medicine

## 2024-01-21 ENCOUNTER — Ambulatory Visit: Payer: Self-pay | Admitting: Internal Medicine

## 2024-01-21 ENCOUNTER — Ambulatory Visit: Payer: BC Managed Care – PPO | Admitting: Internal Medicine

## 2024-01-21 ENCOUNTER — Encounter: Payer: Self-pay | Admitting: Internal Medicine

## 2024-01-21 VITALS — BP 130/80 | HR 66 | Temp 98.5°F | Ht 75.0 in | Wt 253.0 lb

## 2024-01-21 DIAGNOSIS — E1159 Type 2 diabetes mellitus with other circulatory complications: Secondary | ICD-10-CM

## 2024-01-21 DIAGNOSIS — Z7984 Long term (current) use of oral hypoglycemic drugs: Secondary | ICD-10-CM

## 2024-01-21 DIAGNOSIS — I10 Essential (primary) hypertension: Secondary | ICD-10-CM

## 2024-01-21 DIAGNOSIS — Z125 Encounter for screening for malignant neoplasm of prostate: Secondary | ICD-10-CM | POA: Diagnosis not present

## 2024-01-21 DIAGNOSIS — Z0000 Encounter for general adult medical examination without abnormal findings: Secondary | ICD-10-CM

## 2024-01-21 DIAGNOSIS — I480 Paroxysmal atrial fibrillation: Secondary | ICD-10-CM

## 2024-01-21 LAB — COMPREHENSIVE METABOLIC PANEL WITH GFR
ALT: 21 U/L (ref 0–53)
AST: 20 U/L (ref 0–37)
Albumin: 4.5 g/dL (ref 3.5–5.2)
Alkaline Phosphatase: 76 U/L (ref 39–117)
BUN: 20 mg/dL (ref 6–23)
CO2: 29 meq/L (ref 19–32)
Calcium: 9.7 mg/dL (ref 8.4–10.5)
Chloride: 101 meq/L (ref 96–112)
Creatinine, Ser: 1.63 mg/dL — ABNORMAL HIGH (ref 0.40–1.50)
GFR: 45.77 mL/min — ABNORMAL LOW (ref >60.00)
Glucose, Bld: 232 mg/dL — ABNORMAL HIGH (ref 70–99)
Potassium: 3.9 meq/L (ref 3.5–5.1)
Sodium: 140 meq/L (ref 135–145)
Total Bilirubin: 1.1 mg/dL (ref 0.2–1.2)
Total Protein: 6.9 g/dL (ref 6.0–8.3)

## 2024-01-21 LAB — LIPID PANEL
Cholesterol: 122 mg/dL (ref 0–200)
HDL: 37.7 mg/dL — ABNORMAL LOW (ref >39.00)
LDL Cholesterol: 40 mg/dL (ref 0–99)
NonHDL: 84.13
Total CHOL/HDL Ratio: 3
Triglycerides: 223 mg/dL — ABNORMAL HIGH (ref 0.0–149.0)
VLDL: 44.6 mg/dL — ABNORMAL HIGH (ref 0.0–40.0)

## 2024-01-21 LAB — HEMOGLOBIN A1C: Hgb A1c MFr Bld: 9 % — ABNORMAL HIGH (ref 4.6–6.5)

## 2024-01-21 LAB — CBC
HCT: 44.3 % (ref 39.0–52.0)
Hemoglobin: 15.1 g/dL (ref 13.0–17.0)
MCHC: 34 g/dL (ref 30.0–36.0)
MCV: 82.4 fl (ref 78.0–100.0)
Platelets: 159 10*3/uL (ref 150.0–400.0)
RBC: 5.38 Mil/uL (ref 4.22–5.81)
RDW: 14.5 % (ref 11.5–15.5)
WBC: 7.8 10*3/uL (ref 4.0–10.5)

## 2024-01-21 LAB — MICROALBUMIN / CREATININE URINE RATIO
Creatinine,U: 109 mg/dL
Microalb Creat Ratio: 25.6 mg/g (ref 0.0–30.0)
Microalb, Ur: 2.8 mg/dL — ABNORMAL HIGH (ref 0.0–1.9)

## 2024-01-21 LAB — HM DIABETES FOOT EXAM

## 2024-01-21 LAB — PSA: PSA: 0.62 ng/mL (ref 0.10–4.00)

## 2024-01-21 MED ORDER — TIRZEPATIDE 5 MG/0.5ML ~~LOC~~ SOAJ: 5.0000 mg | SUBCUTANEOUS | 2 refills | Status: DC

## 2024-01-21 MED ORDER — TIRZEPATIDE 2.5 MG/0.5ML ~~LOC~~ SOAJ: 2.5000 mg | SUBCUTANEOUS | 0 refills | Status: DC

## 2024-01-21 NOTE — Assessment & Plan Note (Signed)
 Doing okay Discussed fitness Colon due again 2028 Will check PSA Still prefers no immunizations

## 2024-01-21 NOTE — Assessment & Plan Note (Signed)
 BP Readings from Last 3 Encounters:  01/21/24 130/80  01/05/24 (!) 130/90  12/09/23 129/86   Controlled on lisinopril  20mg 

## 2024-01-21 NOTE — Assessment & Plan Note (Signed)
 Hopefully still okay--hasn't been checking Discussed limiting carbs Farxiga  10, metformin  1000mg  daily Will add GLP-1 if over 8%

## 2024-01-21 NOTE — Progress Notes (Signed)
 Subjective:    Patient ID: RISHAV ROCKEFELLER, male    DOB: 1966-04-21, 58 y.o.   MRN: [id number]  HPI Here for physical  Not checking sugars Trying to eat better---but has gained some weight Has been walking Bowels did get better with changing diet----uses senna for some constipation  Still feels weak from the strokes  Current Outpatient Medications on File Prior to Visit  Medication Sig Dispense Refill   apixaban  (ELIQUIS ) 5 MG TABS tablet Take 1 tablet (5 mg total) by mouth 2 (two) times daily. 180 tablet 3   cetirizine (ZYRTEC) 10 MG tablet Take 10 mg by mouth daily.     citalopram  (CELEXA ) 20 MG tablet TAKE 1 TABLET BY MOUTH DAILY 90 tablet 3   colchicine  0.6 MG tablet TAKE 1 TABLET BY MOUTH  TWICE DAILY AS NEEDED 180 tablet 1   cyanocobalamin  1000 MCG tablet Take 1,000 mcg by mouth daily.     diltiazem  (CARDIZEM  CD) 120 MG 24 hr capsule TAKE 1 CAPSULE BY MOUTH DAILY 90 capsule 3   FARXIGA  10 MG TABS tablet TAKE 1 TABLET BY MOUTH DAILY  BEFORE BREAKFAST 90 tablet 3   glucose blood (ONETOUCH VERIO) test strip Use to check blood sugar 1-2 times daily 200 each 11   hyoscyamine  (LEVSIN ) 0.125 MG tablet Take 1 tablet (0.125 mg total) by mouth 3 (three) times daily as needed. 60 tablet 1   lisinopril  (ZESTRIL ) 20 MG tablet TAKE 1 TABLET BY MOUTH DAILY 90 tablet 3   metFORMIN  (GLUCOPHAGE -XR) 500 MG 24 hr tablet TAKE 2 TABLETS BY MOUTH DAILY  WITH BREAKFAST 180 tablet 3   Multiple Vitamin (MULTIVITAMIN WITH MINERALS) TABS tablet Take 1 tablet by mouth daily.     omeprazole  (PRILOSEC) 20 MG capsule TAKE 1 CAPSULE BY MOUTH DAILY 90 capsule 3   OneTouch Delica Lancets 33G MISC Use to obtain blood sugar sample 1-2 times daily. 200 each 11   rosuvastatin  (CRESTOR ) 20 MG tablet Take 1 tablet (20 mg total) by mouth daily. 90 tablet 3   sennosides-docusate sodium (SENOKOT-S) 8.6-50 MG tablet Take 2 tablets by mouth daily.     valACYclovir  (VALTREX ) 1000 MG tablet TAKE 2 TABLETS BY MOUTH TWICE   DAILY FOR 1 DAY FOR COLD SORE (Patient taking differently: as needed.) 20 tablet 3   No current facility-administered medications on file prior to visit.    Allergies  Allergen Reactions   Atorvastatin  Other (See Comments)    Memory problems    Past Medical History:  Diagnosis Date   Allergic rhinitis    Anxiety    Arthritis    Atrial fibrillation (HCC)    Diabetes (HCC)    Elevated LFTs 2000   fatty liver   GERD (gastroesophageal reflux disease)    Glucose intolerance (impaired glucose tolerance)    Gout    Hyperlipidemia    Hypertension    IBS (irritable bowel syndrome)    Lung nodules    Lung nodules 09/01/2023   needs follow up in 3-6 months   Obstructive sleep apnea    Osteoarthritis     Past Surgical History:  Procedure Laterality Date   IR ANGIO INTRA EXTRACRAN SEL INTERNAL CAROTID BILAT MOD SED  04/09/2023   IR ANGIO VERTEBRAL SEL VERTEBRAL BILAT MOD SED  04/09/2023   IR ENDOVASC INTRACRANIAL INF OTHER THAN THROMBO ART INC DIAG ANGIO EA ADD  04/09/2023   IR US  GUIDE VASC ACCESS RIGHT  04/09/2023   NO PAST SURGERIES  RADIOLOGY WITH ANESTHESIA N/A 04/09/2023   Procedure: IR WITH ANESTHESIA;  Surgeon: Radiologist, Medication, MD;  Location: [facility] OR;  Service: Radiology;  Laterality: N/A;   TRANSESOPHAGEAL ECHOCARDIOGRAM (CATH LAB) N/A 07/14/2023   Procedure: TRANSESOPHAGEAL ECHOCARDIOGRAM;  Surgeon: Pietro Redell RAMAN, MD;  Location: [facility] INVASIVE CV LAB;  Service: Cardiovascular;  Laterality: N/A;    Family History  Problem Relation Age of Onset   Hypertension Mother    Diabetes Mother    Heart disease Father        CABG at 71   Cancer Father        lung   Colon polyps Father    Diabetes Sister    Diabetes Brother    Heart failure Brother    Colon cancer Neg Hx    Stomach cancer Neg Hx    Esophageal cancer Neg Hx     Social History   Socioeconomic History   Marital status: Married    Spouse name: Not on file   Number of children: 1   Years of  education: Not on file   Highest education level: Not on file  Occupational History   Occupation: Museum/gallery conservator / Proofreader     Comment: Retired  Tobacco Use   Smoking status: Never   Smokeless tobacco: Never  Vaping Use   Vaping status: Never Used  Substance and Sexual Activity   Alcohol use: Yes    Comment: Occasionally   Drug use: No   Sexual activity: Not on file  Other Topics Concern   Not on file  Social History Narrative   Not on file   Social Drivers of Health   Financial Resource Strain: Not on file  Food Insecurity: No Food Insecurity (04/11/2023)   Hunger Vital Sign    Worried About Running Out of Food in the Last Year: Never true    Ran Out of Food in the Last Year: Never true  Transportation Needs: No Transportation Needs (04/11/2023)   PRAPARE - Administrator, Civil Service (Medical): No    Lack of Transportation (Non-Medical): No  Physical Activity: Not on file  Stress: Not on file  Social Connections: Not on file  Intimate Partner Violence: Not At Risk (04/07/2023)   Humiliation, Afraid, Rape, and Kick questionnaire    Fear of Current or Ex-Partner: No    Emotionally Abused: No    Physically Abused: No    Sexually Abused: No   Review of Systems  Constitutional:  Positive for fatigue and unexpected weight change.       Wears seat belt  HENT:  Negative for dental problem, hearing loss and tinnitus.        Keeps up with dentist  Eyes:  Negative for visual disturbance.       Overdue for eye exam--did have last year  Respiratory:  Negative for cough, chest tightness and shortness of breath.   Cardiovascular:  Negative for chest pain, palpitations and leg swelling.  Gastrointestinal:  Negative for blood in stool.       No heartburn on daily omeprazole   Endocrine: Negative for polydipsia and polyuria.  Genitourinary:  Negative for difficulty urinating and urgency.       No sexual problems  Musculoskeletal:  Negative for back pain and joint  swelling.       Some knee pains--uses brace/biofreeze  Skin:  Negative for rash.       No suspicious lesions  Allergic/Immunologic: Positive for environmental allergies. Negative for immunocompromised state.  Can't mow lawn Takes OTC prn  Neurological:  Negative for dizziness, syncope, light-headedness and headaches.  Hematological:  Negative for adenopathy. Does not bruise/bleed easily.  Psychiatric/Behavioral:  Negative for sleep disturbance.        Mood okay on the citalopram  Uses CPAP nightly with good sleep       Objective:   Physical Exam Constitutional:      Appearance: Normal appearance.  HENT:     Mouth/Throat:     Pharynx: No oropharyngeal exudate or posterior oropharyngeal erythema.   Eyes:     Conjunctiva/sclera: Conjunctivae normal.     Pupils: Pupils are equal, round, and reactive to light.    Cardiovascular:     Rate and Rhythm: Normal rate and regular rhythm.     Pulses: Normal pulses.     Heart sounds: No murmur heard.    No gallop.  Pulmonary:     Effort: Pulmonary effort is normal.     Breath sounds: Normal breath sounds. No wheezing or rales.  Abdominal:     Palpations: Abdomen is soft.     Tenderness: There is no abdominal tenderness.   Musculoskeletal:     Cervical back: Neck supple.     Right lower leg: No edema.     Left lower leg: No edema.  Lymphadenopathy:     Cervical: No cervical adenopathy.   Skin:    Findings: No lesion or rash.     Comments: No foot lesions   Neurological:     General: No focal deficit present.     Mental Status: He is alert and oriented to person, place, and time.     Comments: Normal sensation in feet  Psychiatric:        Mood and Affect: Mood normal.        Behavior: Behavior normal.            Assessment & Plan:

## 2024-01-21 NOTE — Assessment & Plan Note (Signed)
 Regular On eliquis  5 bid

## 2024-01-22 ENCOUNTER — Encounter: Payer: Self-pay | Admitting: Cardiology

## 2024-01-23 ENCOUNTER — Telehealth: Payer: Self-pay | Admitting: *Deleted

## 2024-01-23 NOTE — Telephone Encounter (Signed)
 Copied this note into the lab result

## 2024-01-23 NOTE — Telephone Encounter (Signed)
 Copied from CRM [id number]. Topic: Clinical - Lab/Test Results >> Jan 23, 2024  9:25 AM Burnard DEL wrote: Reason for CRM: Patient returned call to [facility] regarding lab results.I relayed message to patient for provider.Patient stated that he will go pick up monjuro medication from pharmacy.

## 2024-01-23 NOTE — Telephone Encounter (Signed)
 Keven,  Please look into this for me.  He has chronic cardiac medical* conditions.    Bryston Colocho [place], DO, FACC

## 2024-01-23 NOTE — Telephone Encounter (Signed)
 Keven,  Please look into this for me.  He has chronic cardiac medication conditions.   Martavia Tye [place], DO, FACC

## 2024-02-03 ENCOUNTER — Other Ambulatory Visit: Payer: Self-pay | Admitting: Internal Medicine

## 2024-02-16 ENCOUNTER — Telehealth: Payer: Self-pay

## 2024-02-16 NOTE — Telephone Encounter (Signed)
 Spoke with pt's wife to see if the pt's PCP had mentioned a plan for his cholesterol/ triglyceride levels and that I saw that the pt had been started on Mounjaro  for diabetic control. Pt's wife stated that Dr. Jimmy had said he felt the Mounjaro  would help with all elevated lab levels from the labs drawn 3 weeks ago and wanted the pt to have labs redrawn in September (3 months from the time he started Mounjaro ). Explained to pt's wife that I would send this information to Dr. Michele to review and if any further recommendations were made in the mean time, we would call and let them know. Pt's wife verbalized understanding of plan and had no further questions.

## 2024-02-16 NOTE — Telephone Encounter (Signed)
 Agree, Mounjaro  should help his TAG levels.  When the repeat labs are done w/ his PCP send us  a copy for reference.   Taylyn Brame [place], DO, FACC

## 2024-02-16 NOTE — Telephone Encounter (Signed)
-----   Message from [facility] sent at 02/10/2024  2:14 PM EDT ----- Keven,   Please contact primary team or patient on how we can help regarding his lipids.   I assume as his A1c improves his  TAG should improve as well.   Otherwise would consider either fenofibrate or Vascepa.  Dr. Michele ----- Message ----- From: Kallie Clotilda SQUIBB, CMA Sent: 02/09/2024  11:54 AM EDT To: Madonna Michele, DO

## 2024-02-19 ENCOUNTER — Other Ambulatory Visit: Payer: Self-pay | Admitting: Internal Medicine

## 2024-02-19 DIAGNOSIS — E113293 Type 2 diabetes mellitus with mild nonproliferative diabetic retinopathy without macular edema, bilateral: Secondary | ICD-10-CM | POA: Diagnosis not present

## 2024-03-17 NOTE — Progress Notes (Signed)
 Pt scheduled for 3 mo f/u and labs on 9.30.25, spoke to wife Cristy on [place]

## 2024-04-08 DIAGNOSIS — G4733 Obstructive sleep apnea (adult) (pediatric): Secondary | ICD-10-CM | POA: Diagnosis not present

## 2024-04-19 ENCOUNTER — Telehealth: Payer: Self-pay

## 2024-04-19 MED ORDER — LISINOPRIL 20 MG PO TABS: 20.0000 mg | ORAL_TABLET | Freq: Every day | ORAL | 0 refills | Status: DC

## 2024-04-19 MED ORDER — OMEPRAZOLE 20 MG PO CPDR: 20.0000 mg | DELAYED_RELEASE_CAPSULE | Freq: Every day | ORAL | 0 refills | Status: DC

## 2024-04-19 MED ORDER — CITALOPRAM HYDROBROMIDE 20 MG PO TABS: 20.0000 mg | ORAL_TABLET | Freq: Every day | ORAL | 0 refills | Status: DC

## 2024-04-19 NOTE — Telephone Encounter (Signed)
 Rx sent electronically.

## 2024-04-27 ENCOUNTER — Other Ambulatory Visit

## 2024-04-27 ENCOUNTER — Ambulatory Visit

## 2024-04-27 ENCOUNTER — Other Ambulatory Visit: Payer: Self-pay

## 2024-04-27 ENCOUNTER — Ambulatory Visit: Payer: Self-pay

## 2024-04-27 VITALS — BP 122/80 | HR 70 | Temp 97.8°F | Ht 75.0 in | Wt 228.0 lb

## 2024-04-27 DIAGNOSIS — R809 Proteinuria, unspecified: Secondary | ICD-10-CM

## 2024-04-27 DIAGNOSIS — Z7984 Long term (current) use of oral hypoglycemic drugs: Secondary | ICD-10-CM | POA: Diagnosis not present

## 2024-04-27 DIAGNOSIS — Z7985 Long-term (current) use of injectable non-insulin antidiabetic drugs: Secondary | ICD-10-CM

## 2024-04-27 DIAGNOSIS — I10 Essential (primary) hypertension: Secondary | ICD-10-CM

## 2024-04-27 DIAGNOSIS — E1159 Type 2 diabetes mellitus with other circulatory complications: Secondary | ICD-10-CM | POA: Diagnosis not present

## 2024-04-27 DIAGNOSIS — F419 Anxiety disorder, unspecified: Secondary | ICD-10-CM | POA: Insufficient documentation

## 2024-04-27 DIAGNOSIS — R7989 Other specified abnormal findings of blood chemistry: Secondary | ICD-10-CM | POA: Diagnosis not present

## 2024-04-27 DIAGNOSIS — Z8673 Personal history of transient ischemic attack (TIA), and cerebral infarction without residual deficits: Secondary | ICD-10-CM

## 2024-04-27 DIAGNOSIS — R17 Unspecified jaundice: Secondary | ICD-10-CM

## 2024-04-27 LAB — COMPREHENSIVE METABOLIC PANEL WITH GFR
ALT: 28 U/L (ref 0–53)
AST: 24 U/L (ref 0–37)
Albumin: 4.6 g/dL (ref 3.5–5.2)
Alkaline Phosphatase: 52 U/L (ref 39–117)
BUN: 18 mg/dL (ref 6–23)
CO2: 30 meq/L (ref 19–32)
Calcium: 9.7 mg/dL (ref 8.4–10.5)
Chloride: 102 meq/L (ref 96–112)
Creatinine, Ser: 1.34 mg/dL (ref 0.40–1.50)
GFR: 57.8 mL/min — ABNORMAL LOW (ref >60.00)
Glucose, Bld: 113 mg/dL — ABNORMAL HIGH (ref 70–99)
Potassium: 3.9 meq/L (ref 3.5–5.1)
Sodium: 140 meq/L (ref 135–145)
Total Bilirubin: 1.4 mg/dL — ABNORMAL HIGH (ref 0.2–1.2)
Total Protein: 6.9 g/dL (ref 6.0–8.3)

## 2024-04-27 LAB — POCT GLYCOSYLATED HEMOGLOBIN (HGB A1C): Hemoglobin A1C: 5.6 % (ref 4.0–5.6)

## 2024-04-27 MED ORDER — FAMOTIDINE 20 MG PO TABS: 20.0000 mg | ORAL_TABLET | Freq: Every day | ORAL | 0 refills | Status: AC | PRN

## 2024-04-27 NOTE — Telephone Encounter (Signed)
 Last OV today Next OV 05-06-24 [facility]

## 2024-04-27 NOTE — Progress Notes (Signed)
 Subjective:    Patient ID: Charles Phelps, male    DOB: October 25, 1965, 58 y.o.   MRN: [id number]   Charles Phelps is a very pleasant 58 y.o. male who presents today for follow-up of chronic conditions States his diet has improved, he has eliminated fast food. Says he does try to exercise every now and then, he does walk, and has been trying to lift weights but is not tolerating that very well. Says with the Mounjaro , he is eating smaller portions.  Review of Systems  All other systems reviewed and are negative.       Allergies  Allergen Reactions   Atorvastatin  Other (See Comments)    Memory problems    Current Outpatient Medications on File Prior to Visit  Medication Sig Dispense Refill   apixaban  (ELIQUIS ) 5 MG TABS tablet Take 1 tablet (5 mg total) by mouth 2 (two) times daily. 180 tablet 3   cetirizine (ZYRTEC) 10 MG tablet Take 10 mg by mouth daily.     citalopram  (CELEXA ) 20 MG tablet Take 1 tablet (20 mg total) by mouth daily. 90 tablet 0   colchicine  0.6 MG tablet TAKE 1 TABLET BY MOUTH  TWICE DAILY AS NEEDED 180 tablet 1   cyanocobalamin  1000 MCG tablet Take 1,000 mcg by mouth daily.     diltiazem  (CARDIZEM  CD) 120 MG 24 hr capsule TAKE 1 CAPSULE BY MOUTH DAILY 90 capsule 3   FARXIGA  10 MG TABS tablet TAKE 1 TABLET BY MOUTH DAILY  BEFORE BREAKFAST 90 tablet 3   glucose blood (ONETOUCH VERIO) test strip Use to check blood sugar 1-2 times daily 200 each 11   hyoscyamine  (LEVSIN ) 0.125 MG tablet Take 1 tablet (0.125 mg total) by mouth 3 (three) times daily as needed. 60 tablet 1   lisinopril  (ZESTRIL ) 20 MG tablet Take 1 tablet (20 mg total) by mouth daily. 90 tablet 0   metFORMIN  (GLUCOPHAGE -XR) 500 MG 24 hr tablet TAKE 2 TABLETS BY MOUTH DAILY  WITH BREAKFAST 180 tablet 3   Multiple Vitamin (MULTIVITAMIN WITH MINERALS) TABS tablet Take 1 tablet by mouth daily.     OneTouch Delica Lancets 33G MISC Use to obtain blood sugar sample 1-2 times daily. 200 each 11    rosuvastatin  (CRESTOR ) 20 MG tablet TAKE 1 TABLET BY MOUTH DAILY 90 tablet 3   tirzepatide  (MOUNJARO ) 5 MG/0.5ML Pen Inject 5 mg into the skin once a week. 2 mL 2   valACYclovir  (VALTREX ) 1000 MG tablet TAKE 2 TABLETS BY MOUTH TWICE  DAILY FOR 1 DAY FOR COLD SORE (Patient taking differently: as needed.) 20 tablet 3   No current facility-administered medications on file prior to visit.    BP 122/80 (BP Location: Left Arm, Patient Position: Sitting, Cuff Size: Large)   Pulse 70   Temp 97.8 F (36.6 C) (Oral)   Ht 6' 3 (1.905 m)   Wt 228 lb (103.4 kg)   SpO2 98%   BMI 28.50 kg/m   Objective:    Physical Exam Vitals and nursing note reviewed.  Constitutional:      Appearance: Normal appearance.  HENT:     Head: Normocephalic and atraumatic.  Eyes:     Extraocular Movements: Extraocular movements intact.     Conjunctiva/sclera: Conjunctivae normal.  Skin:    General: Skin is warm.  Neurological:     Mental Status: He is alert.  Psychiatric:        Mood and Affect: Mood normal.  Behavior: Behavior normal.            Assessment & Plan:   1. 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 Essential hypertension, benign 3. History of recent stroke Patient currently on Farxiga , Mounjaro , and metformin .  A1c in the clinic today is 5.6, given CVA a year ago, tight control at less than 7 is important, will refrain from changing medication regimen, but would consider discontinuing metformin  at his next visit given low-dose and combination with GLP-1 likely increases his GI discomfort.  Plan for him to RTC in 1 to 2 weeks for complete diabetic visit. Patient is normotensive this visit at 122/80, not currently checking at home, instructed about blood pressure goal of less than 130/80 given recent stroke, he will check at home and send 7 days worth of readings. Regarding recent right pontine stroke, it appears short-term memory loss is a  lingering deficit, continues to follow with neurology.  Associated risk factor in HLD controlled on Crestor .  Could consider repeating lipid panel in about 3 months time, if unimproved at that time, would consider escalating Crestor  dose to 40 mg. Healthy diet/lifestyle recommendations given, weight continues to improve on Mounjaro , weight a year ago was 255, today at 228lbs.  - HgB A1c - Continue Farxiga  10 mg daily -Continue lisinopril  20 mg daily - Continue metformin  1000 mg daily - Continue Crestor  20 mg daily - Continue Mounjaro  5 mg weekly  4. Elevated serum creatinine 5. Proteinuria, unspecified type Per chart review, lab from 3 months ago did show microalbuminuria, although creatinine ratio was less than 30, CMP showed reduced GFR at 45.  No history of NSAID use or prior documented CKD, unclear whether this is an acute change versus true CKD, will repeat at this time to monitor, if rapidly progressing, will refer to nephrology, if unchanged/improved, would continue with tight control of DM and HTN as risk factors.  - Comprehensive metabolic panel with GFR - Continue lisinopri and Farxiga  l as above for renal protection   Return in about 1 week (around 05/04/2024) for Diabetes.   Laiklyn Pilkenton K Rexann Lueras, MD  04/27/24

## 2024-04-27 NOTE — Patient Instructions (Addendum)
 Thank you for visiting [facility] today! Here's what we talked about: - Stop Omeprazole  and start Pepcid as needed - Blood pressure less than 130/80 is the goal. Check it daily, send me 7 days of readings, try to keep readings around the same time of the days - 150 mins of exercise per week

## 2024-04-28 ENCOUNTER — Telehealth: Payer: Self-pay

## 2024-04-28 MED ORDER — METFORMIN HCL ER 500 MG PO TB24: 1000.0000 mg | ORAL_TABLET | Freq: Every day | ORAL | 3 refills | Status: DC

## 2024-04-28 NOTE — Telephone Encounter (Signed)
 Please call patient and let him know the following: Dr. Bennett would like to know how you feel about discontinuing the metformin  since your A1c is now improved. Abdominal symptoms/discomfort is amplified by being on both the metformin  and the Mounjaro  which both cause this side effect, eliminating one would help improve this.  We would keep a close eye on the A1c, if it started to creep up, we would increase the dose of the Mounjaro . Let me know how patient feels

## 2024-04-28 NOTE — Telephone Encounter (Signed)
 Spoke to pt's wife per DPR. She said he would love to stop the metformin . I have taken it off of his med list.

## 2024-04-28 NOTE — Telephone Encounter (Signed)
 Noted, discontinued metformin 

## 2024-04-28 NOTE — Addendum Note (Signed)
 Addended by: KALLIE CLOTILDA SQUIBB on: 04/28/2024 08:48 AM   Modules accepted: Orders

## 2024-04-30 ENCOUNTER — Ambulatory Visit: Payer: Self-pay

## 2024-04-30 DIAGNOSIS — H3500 Unspecified background retinopathy: Secondary | ICD-10-CM | POA: Insufficient documentation

## 2024-04-30 LAB — OPHTHALMOLOGY REPORT-SCANNED

## 2024-05-06 ENCOUNTER — Ambulatory Visit

## 2024-05-06 VITALS — BP 130/88 | HR 66 | Temp 98.0°F | Ht 75.0 in | Wt 227.0 lb

## 2024-05-06 DIAGNOSIS — E1159 Type 2 diabetes mellitus with other circulatory complications: Secondary | ICD-10-CM

## 2024-05-06 DIAGNOSIS — Z7984 Long term (current) use of oral hypoglycemic drugs: Secondary | ICD-10-CM

## 2024-05-06 DIAGNOSIS — E785 Hyperlipidemia, unspecified: Secondary | ICD-10-CM

## 2024-05-06 DIAGNOSIS — Z7985 Long-term (current) use of injectable non-insulin antidiabetic drugs: Secondary | ICD-10-CM

## 2024-05-06 DIAGNOSIS — Z125 Encounter for screening for malignant neoplasm of prostate: Secondary | ICD-10-CM | POA: Diagnosis not present

## 2024-05-06 DIAGNOSIS — N183 Chronic kidney disease, stage 3 unspecified: Secondary | ICD-10-CM | POA: Insufficient documentation

## 2024-05-06 DIAGNOSIS — R17 Unspecified jaundice: Secondary | ICD-10-CM

## 2024-05-06 DIAGNOSIS — N1831 Chronic kidney disease, stage 3a: Secondary | ICD-10-CM | POA: Diagnosis not present

## 2024-05-06 DIAGNOSIS — Z113 Encounter for screening for infections with a predominantly sexual mode of transmission: Secondary | ICD-10-CM

## 2024-05-06 LAB — HM DIABETES FOOT EXAM

## 2024-05-06 MED ORDER — ROSUVASTATIN CALCIUM 20 MG PO TABS: 40.0000 mg | ORAL_TABLET | Freq: Every day | ORAL | Status: AC

## 2024-05-06 NOTE — Addendum Note (Signed)
 Addended by: HOPE VEVA PARAS on: 05/06/2024 11:25 AM   Modules accepted: Orders

## 2024-05-06 NOTE — Patient Instructions (Addendum)
 Thank you for visiting [facility] today! Here's what we talked about: - Eye doctor to fax results - Start taking 2 tablets of Crestor 

## 2024-05-06 NOTE — Progress Notes (Signed)
 Subjective:    Patient ID: Charles Phelps, male    DOB: 05-28-1966, 58 y.o.   MRN: [id number]  HPI  Charles Phelps is a very pleasant 58 y.o. male who presents today for DM visit.  -Denies polydipsia, polyuria, weight loss, fatigue or any LE wounds.   Current A1c: 5.6 Last A1c: 9 Current meds: Farxiga , Mounjaro ,  Past meds: Farxiga , Mounjaro , metformin  Neuropathy: No  Nephropathy: Yes Retinopathy: UTD, at [facility] eye   Urine microalbumin: UTD Eye exam: UTD reportedly last few months Foot exam: Done Ace-i/ARB: Yes Statin: Yes Influenza vaccine: Declined Pneumococcal vaccine: Declined Diet: Improving Weight concerns: Improving   Review of Systems  All other systems reviewed and are negative.        Past Medical History:  Diagnosis Date   Allergic rhinitis    Anxiety    Arthritis    Atrial fibrillation (HCC)    Diabetes (HCC)    Elevated LFTs 2000   fatty liver   GERD (gastroesophageal reflux disease)    Glucose intolerance (impaired glucose tolerance)    Gout    Hyperlipidemia    Hypertension    IBS (irritable bowel syndrome)    Lung nodules    Lung nodules 09/01/2023   needs follow up in 3-6 months   Obstructive sleep apnea    Osteoarthritis     Social History   Socioeconomic History   Marital status: Married    Spouse name: Not on file   Number of children: 1   Years of education: Not on file   Highest education level: Not on file  Occupational History   Occupation: Museum/gallery conservator / Proofreader     Comment: Retired  Tobacco Use   Smoking status: Never   Smokeless tobacco: Never  Vaping Use   Vaping status: Never Used  Substance and Sexual Activity   Alcohol use: Yes    Comment: Occasionally   Drug use: No   Sexual activity: Not on file  Other Topics Concern   Not on file  Social History Narrative   Not on file   Social Drivers of Health   Financial Resource Strain: Not on file  Food Insecurity: No Food Insecurity  (04/11/2023)   Hunger Vital Sign    Worried About Running Out of Food in the Last Year: Never true    Ran Out of Food in the Last Year: Never true  Transportation Needs: No Transportation Needs (04/11/2023)   PRAPARE - Administrator, Civil Service (Medical): No    Lack of Transportation (Non-Medical): No  Physical Activity: Not on file  Stress: Not on file  Social Connections: Not on file  Intimate Partner Violence: Not At Risk (04/07/2023)   Humiliation, Afraid, Rape, and Kick questionnaire    Fear of Current or Ex-Partner: No    Emotionally Abused: No    Physically Abused: No    Sexually Abused: No    Past Surgical History:  Procedure Laterality Date   IR ANGIO INTRA EXTRACRAN SEL INTERNAL CAROTID BILAT MOD SED  04/09/2023   IR ANGIO VERTEBRAL SEL VERTEBRAL BILAT MOD SED  04/09/2023   IR ENDOVASC INTRACRANIAL INF OTHER THAN THROMBO ART INC DIAG ANGIO EA ADD  04/09/2023   IR US  GUIDE VASC ACCESS RIGHT  04/09/2023   NO PAST SURGERIES     RADIOLOGY WITH ANESTHESIA N/A 04/09/2023   Procedure: IR WITH ANESTHESIA;  Surgeon: Radiologist, Medication, MD;  Location: [facility] OR;  Service: Radiology;  Laterality: N/A;  TRANSESOPHAGEAL ECHOCARDIOGRAM (CATH LAB) N/A 07/14/2023   Procedure: TRANSESOPHAGEAL ECHOCARDIOGRAM;  Surgeon: Pietro Redell RAMAN, MD;  Location: [facility] INVASIVE CV LAB;  Service: Cardiovascular;  Laterality: N/A;    Family History  Problem Relation Age of Onset   Hypertension Mother    Diabetes Mother    Heart disease Father        CABG at 60   Cancer Father        lung   Colon polyps Father    Diabetes Sister    Diabetes Brother    Heart failure Brother    Colon cancer Neg Hx    Stomach cancer Neg Hx    Esophageal cancer Neg Hx     Allergies  Allergen Reactions   Atorvastatin  Other (See Comments)    Memory problems    Current Outpatient Medications on File Prior to Visit  Medication Sig Dispense Refill   apixaban  (ELIQUIS ) 5 MG TABS tablet Take 1 tablet (5  mg total) by mouth 2 (two) times daily. 180 tablet 3   cetirizine (ZYRTEC) 10 MG tablet Take 10 mg by mouth daily. (Patient taking differently: Take 10 mg by mouth daily as needed.)     citalopram  (CELEXA ) 20 MG tablet Take 1 tablet (20 mg total) by mouth daily. 90 tablet 0   colchicine  0.6 MG tablet TAKE 1 TABLET BY MOUTH  TWICE DAILY AS NEEDED 180 tablet 1   cyanocobalamin  1000 MCG tablet Take 1,000 mcg by mouth daily.     diltiazem  (CARDIZEM  CD) 120 MG 24 hr capsule TAKE 1 CAPSULE BY MOUTH DAILY 90 capsule 3   famotidine (PEPCID) 20 MG tablet Take 1 tablet (20 mg total) by mouth daily as needed for heartburn or indigestion. Take at least 10 mins before meals. 90 tablet 0   FARXIGA  10 MG TABS tablet TAKE 1 TABLET BY MOUTH DAILY  BEFORE BREAKFAST 90 tablet 3   glucose blood (ONETOUCH VERIO) test strip Use to check blood sugar 1-2 times daily 200 each 11   hyoscyamine  (LEVSIN ) 0.125 MG tablet Take 1 tablet (0.125 mg total) by mouth 3 (three) times daily as needed. 60 tablet 1   lisinopril  (ZESTRIL ) 20 MG tablet Take 1 tablet (20 mg total) by mouth daily. 90 tablet 0   Multiple Vitamin (MULTIVITAMIN WITH MINERALS) TABS tablet Take 1 tablet by mouth daily.     OneTouch Delica Lancets 33G MISC Use to obtain blood sugar sample 1-2 times daily. 200 each 11   tirzepatide  (MOUNJARO ) 5 MG/0.5ML Pen Inject 5 mg into the skin once a week. 2 mL 2   valACYclovir  (VALTREX ) 1000 MG tablet TAKE 2 TABLETS BY MOUTH TWICE  DAILY FOR 1 DAY FOR COLD SORE (Patient taking differently: as needed.) 20 tablet 3   No current facility-administered medications on file prior to visit.    BP 130/88 (BP Location: Left Arm, Patient Position: Sitting, Cuff Size: Large)   Pulse 66   Temp 98 F (36.7 C) (Oral)   Ht 6' 3 (1.905 m)   Wt 227 lb (103 kg)   SpO2 98%   BMI 28.37 kg/m  Objective:   Physical Exam Vitals and nursing note reviewed.  Constitutional:      Appearance: Normal appearance.  Cardiovascular:      Pulses:          Dorsalis pedis pulses are 2+ on the right side and 2+ on the left side.       Posterior tibial pulses are 2+ on the right  side and 2+ on the left side.  Feet:     Right foot:     Protective Sensation: 10 sites tested.  10 sites sensed.     Skin integrity: Skin integrity normal.     Toenail Condition: Right toenails are normal.     Left foot:     Protective Sensation: 10 sites tested.  10 sites sensed.     Skin integrity: Skin integrity normal.     Toenail Condition: Left toenails are normal.  Neurological:     Mental Status: He is alert.        Assessment & Plan:   1. 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 Hyperlipidemia, unspecified hyperlipidemia type A1c well-controlled at 5.6, significantly improved from prior when it was 9.  Tight control is necessary given his CVA year ago.  Discontinued metformin  to reduce GI discomfort given that he is also on GLP-1.  Will maintain current regimen as below with plan to repeat in 3 months.  If A1c is increasing, would uptitrate Mounjaro . Increased statin intensity given recent CVA, as below.  Can de-escalate if patient does not tolerate. HTN well-controlled.  Declines influenza and pneumococcal vaccine.  UTD on urine microalbumin, future one ordered for next year.  - Microalbumin/Creatinine Ratio, Urine; Future - rosuvastatin  (CRESTOR ) 20 MG tablet; Take 2 tablets (40 mg total) by mouth daily. - Continue Mounjaro  5 mg weekly - Continue Farxiga  10 mg daily - Continue lisinopril  20 mg daily   3. Prostate cancer screening 4. Screen for STD (sexually transmitted disease) 5. Stage 3a chronic kidney disease (HCC) Patient will be due for physical next year, see age-appropriate future labs ordered below.  Added CMP for monitoring of his CKD as well at that time.  - PSA; Future - Lipid Panel; Future - Hepatitis C antibody; Future - Comprehensive metabolic panel with GFR; Future  6.  Serum total bilirubin elevated See chart review for prior documentation regarding this issue, released orders for workup ordered below.  - CBC with Differential - Peripheral Blood Smear Review - Lactate Dehydrogenase - Bilirubin, fractionated(tot/dir/indir)   Return in about 3 months (around 08/06/2024), or Diabetic visit, for then 01/20/2025 for CPE with fasting lab appt 1wk prio.  Julen Rubert K Duane Earnshaw, MD  05/06/24

## 2024-05-07 ENCOUNTER — Ambulatory Visit: Payer: Self-pay

## 2024-05-07 LAB — CBC WITH DIFFERENTIAL/PLATELET
Absolute Lymphocytes: 1898 {cells}/uL (ref 850–3900)
Absolute Monocytes: 621 {cells}/uL (ref 200–950)
Basophils Absolute: 62 {cells}/uL (ref 0–200)
Basophils Relative: 0.9 %
Eosinophils Absolute: 159 {cells}/uL (ref 15–500)
Eosinophils Relative: 2.3 %
HCT: 44.3 % (ref 38.5–50.0)
Hemoglobin: 15.4 g/dL (ref 13.2–17.1)
MCH: 30.4 pg (ref 27.0–33.0)
MCHC: 34.8 g/dL (ref 32.0–36.0)
MCV: 87.4 fL (ref 80.0–100.0)
MPV: 12.3 fL (ref 7.5–12.5)
Monocytes Relative: 9 %
Neutro Abs: 4161 {cells}/uL (ref 1500–7800)
Neutrophils Relative %: 60.3 %
Platelets: 179 Thousand/uL (ref 140–400)
RBC: 5.07 Million/uL (ref 4.20–5.80)
RDW: 14.7 % (ref 11.0–15.0)
Total Lymphocyte: 27.5 %
WBC: 6.9 Thousand/uL (ref 3.8–10.8)

## 2024-05-07 LAB — LACTATE DEHYDROGENASE: LDH: 119 U/L — ABNORMAL LOW (ref 120–250)

## 2024-05-07 LAB — BILIRUBIN, FRACTIONATED(TOT/DIR/INDIR)
Bilirubin, Direct: 0.4 mg/dL — ABNORMAL HIGH (ref 0.0–0.2)
Indirect Bilirubin: 1.3 mg/dL — ABNORMAL HIGH (ref 0.2–1.2)
Total Bilirubin: 1.7 mg/dL — ABNORMAL HIGH (ref 0.2–1.2)

## 2024-05-11 ENCOUNTER — Other Ambulatory Visit: Payer: Self-pay

## 2024-05-19 ENCOUNTER — Other Ambulatory Visit: Payer: Self-pay

## 2024-05-20 MED ORDER — DILTIAZEM HCL ER COATED BEADS 120 MG PO CP24: 120.0000 mg | ORAL_CAPSULE | Freq: Every day | ORAL | 3 refills | Status: AC

## 2024-06-08 DIAGNOSIS — G4733 Obstructive sleep apnea (adult) (pediatric): Secondary | ICD-10-CM | POA: Diagnosis not present

## 2024-06-10 ENCOUNTER — Other Ambulatory Visit (HOSPITAL_COMMUNITY): Payer: Self-pay

## 2024-06-10 ENCOUNTER — Telehealth: Payer: Self-pay

## 2024-06-10 NOTE — Telephone Encounter (Signed)
 Pharmacy Patient Advocate Encounter  Received notification from [employer] that Prior Authorization for Mounjaro  5 has been APPROVED from 06/10/24 to 06/10/25. Ran test claim, Copay is $25.00. This test claim was processed through [facility]- copay amounts may vary at other pharmacies due to pharmacy/plan contracts, or as the patient moves through the different stages of their insurance plan.   PA #/Case ID/Reference #: # [id number]

## 2024-06-10 NOTE — Telephone Encounter (Signed)
 Pharmacy Patient Advocate Encounter   Received notification from Onbase that prior authorization for Mounjaro  5 is required/requested.   Insurance verification completed.   The patient is insured through [facility].   Per test claim: PA required; PA submitted to above mentioned insurance via Latent Key/confirmation #/EOC [id number] Status is pending

## 2024-07-12 ENCOUNTER — Other Ambulatory Visit: Payer: Self-pay | Admitting: Family

## 2024-07-16 NOTE — Addendum Note (Signed)
 Addended by: WENDELL ARLAND RAMAN on: 07/16/2024 12:33 PM   Modules accepted: Orders

## 2024-08-03 ENCOUNTER — Other Ambulatory Visit: Payer: Self-pay

## 2024-08-06 ENCOUNTER — Ambulatory Visit

## 2024-08-10 ENCOUNTER — Ambulatory Visit

## 2024-09-10 ENCOUNTER — Ambulatory Visit

## 2025-01-14 ENCOUNTER — Other Ambulatory Visit

## 2025-01-21 ENCOUNTER — Ambulatory Visit

## 2025-01-21 ENCOUNTER — Encounter
